# Patient Record
Sex: Female | Born: 1955
Health system: Southern US, Community
[De-identification: ages and names within clinical notes are randomized; demographics above are authoritative.]

## PROBLEM LIST (undated history)

## (undated) DIAGNOSIS — I10 Essential (primary) hypertension: Secondary | ICD-10-CM

## (undated) DIAGNOSIS — S82899A Other fracture of unspecified lower leg, initial encounter for closed fracture: Secondary | ICD-10-CM

## (undated) DIAGNOSIS — F4321 Adjustment disorder with depressed mood: Secondary | ICD-10-CM

## (undated) DIAGNOSIS — R112 Nausea with vomiting, unspecified: Secondary | ICD-10-CM

## (undated) DIAGNOSIS — M1990 Unspecified osteoarthritis, unspecified site: Secondary | ICD-10-CM

## (undated) DIAGNOSIS — I8390 Asymptomatic varicose veins of unspecified lower extremity: Secondary | ICD-10-CM

## (undated) DIAGNOSIS — B009 Herpesviral infection, unspecified: Secondary | ICD-10-CM

## (undated) DIAGNOSIS — G43909 Migraine, unspecified, not intractable, without status migrainosus: Secondary | ICD-10-CM

## (undated) DIAGNOSIS — R7309 Other abnormal glucose: Secondary | ICD-10-CM

## (undated) DIAGNOSIS — N809 Endometriosis, unspecified: Secondary | ICD-10-CM

## (undated) DIAGNOSIS — Z9889 Other specified postprocedural states: Secondary | ICD-10-CM

## (undated) HISTORY — PX: BREAST BIOPSY: SHX20

## (undated) HISTORY — PX: CYSTECTOMY: SUR359

## (undated) HISTORY — DX: Adjustment disorder with depressed mood: F43.21

## (undated) HISTORY — DX: Other fracture of unspecified lower leg, initial encounter for closed fracture: S82.899A

## (undated) HISTORY — DX: Herpesviral infection, unspecified: B00.9

## (undated) HISTORY — DX: Essential (primary) hypertension: I10

## (undated) HISTORY — DX: Asymptomatic varicose veins of unspecified lower extremity: I83.90

## (undated) HISTORY — DX: Endometriosis, unspecified: N80.9

## (undated) HISTORY — DX: Unspecified osteoarthritis, unspecified site: M19.90

## (undated) HISTORY — DX: Migraine, unspecified, not intractable, without status migrainosus: G43.909

## (undated) HISTORY — PX: CHOLECYSTECTOMY: SHX55

## (undated) HISTORY — DX: Other abnormal glucose: R73.09

---

## 1980-04-27 HISTORY — PX: HERNIA REPAIR: SHX51

## 1992-04-27 HISTORY — PX: LAPAROSCOPIC ASSISTED VAGINAL HYSTERECTOMY: SHX5398

## 1992-04-27 HISTORY — PX: ABDOMINAL HYSTERECTOMY: SHX81

## 1999-09-04 ENCOUNTER — Encounter: Admission: RE | Admit: 1999-09-04 | Discharge: 1999-09-04 | Payer: Self-pay | Admitting: Obstetrics and Gynecology

## 1999-09-04 ENCOUNTER — Encounter: Payer: Self-pay | Admitting: Obstetrics and Gynecology

## 1999-10-13 ENCOUNTER — Ambulatory Visit (HOSPITAL_COMMUNITY): Admission: RE | Admit: 1999-10-13 | Discharge: 1999-10-13 | Payer: Self-pay

## 2000-06-25 ENCOUNTER — Emergency Department (HOSPITAL_COMMUNITY): Admission: EM | Admit: 2000-06-25 | Discharge: 2000-06-25 | Payer: Self-pay | Admitting: Emergency Medicine

## 2000-07-20 ENCOUNTER — Encounter: Payer: Self-pay | Admitting: Surgery

## 2000-07-20 ENCOUNTER — Ambulatory Visit (HOSPITAL_COMMUNITY): Admission: RE | Admit: 2000-07-20 | Discharge: 2000-07-20 | Payer: Self-pay | Admitting: Surgery

## 2000-08-10 ENCOUNTER — Encounter: Payer: Self-pay | Admitting: Surgery

## 2000-08-10 ENCOUNTER — Ambulatory Visit (HOSPITAL_COMMUNITY): Admission: RE | Admit: 2000-08-10 | Discharge: 2000-08-10 | Payer: Self-pay | Admitting: Surgery

## 2000-09-27 ENCOUNTER — Encounter: Payer: Self-pay | Admitting: Surgery

## 2000-09-27 ENCOUNTER — Encounter (INDEPENDENT_AMBULATORY_CARE_PROVIDER_SITE_OTHER): Payer: Self-pay | Admitting: Specialist

## 2000-09-27 ENCOUNTER — Observation Stay: Admission: RE | Admit: 2000-09-27 | Discharge: 2000-09-28 | Payer: Self-pay | Admitting: Surgery

## 2001-04-27 HISTORY — PX: HERNIA REPAIR: SHX51

## 2001-06-23 ENCOUNTER — Encounter: Admission: RE | Admit: 2001-06-23 | Discharge: 2001-06-23 | Payer: Self-pay | Admitting: Obstetrics and Gynecology

## 2001-06-23 ENCOUNTER — Encounter: Payer: Self-pay | Admitting: Obstetrics and Gynecology

## 2001-08-23 ENCOUNTER — Inpatient Hospital Stay (HOSPITAL_COMMUNITY): Admission: RE | Admit: 2001-08-23 | Discharge: 2001-08-24 | Payer: Self-pay | Admitting: Obstetrics and Gynecology

## 2002-08-31 ENCOUNTER — Encounter: Admission: RE | Admit: 2002-08-31 | Discharge: 2002-08-31 | Payer: Self-pay | Admitting: Surgery

## 2002-08-31 ENCOUNTER — Encounter: Payer: Self-pay | Admitting: Surgery

## 2002-09-12 ENCOUNTER — Encounter: Payer: Self-pay | Admitting: Obstetrics and Gynecology

## 2002-09-12 ENCOUNTER — Encounter: Admission: RE | Admit: 2002-09-12 | Discharge: 2002-09-12 | Payer: Self-pay | Admitting: Obstetrics and Gynecology

## 2003-01-02 ENCOUNTER — Encounter: Admission: RE | Admit: 2003-01-02 | Discharge: 2003-01-02 | Payer: Self-pay | Admitting: Surgery

## 2003-01-02 ENCOUNTER — Encounter: Payer: Self-pay | Admitting: Surgery

## 2003-07-24 ENCOUNTER — Encounter: Admission: RE | Admit: 2003-07-24 | Discharge: 2003-07-24 | Payer: Self-pay | Admitting: Surgery

## 2003-12-24 ENCOUNTER — Encounter: Admission: RE | Admit: 2003-12-24 | Discharge: 2003-12-24 | Payer: Self-pay | Admitting: Obstetrics and Gynecology

## 2004-01-10 ENCOUNTER — Encounter: Admission: RE | Admit: 2004-01-10 | Discharge: 2004-01-10 | Payer: Self-pay | Admitting: Obstetrics and Gynecology

## 2004-02-27 ENCOUNTER — Inpatient Hospital Stay (HOSPITAL_COMMUNITY): Admission: RE | Admit: 2004-02-27 | Discharge: 2004-03-03 | Payer: Self-pay | Admitting: Obstetrics and Gynecology

## 2004-02-27 ENCOUNTER — Encounter (INDEPENDENT_AMBULATORY_CARE_PROVIDER_SITE_OTHER): Payer: Self-pay | Admitting: *Deleted

## 2005-02-04 ENCOUNTER — Encounter: Admission: RE | Admit: 2005-02-04 | Discharge: 2005-02-04 | Payer: Self-pay | Admitting: Obstetrics and Gynecology

## 2005-02-25 HISTORY — PX: COLON RESECTION: SHX5231

## 2005-03-02 ENCOUNTER — Emergency Department (HOSPITAL_COMMUNITY): Admission: EM | Admit: 2005-03-02 | Discharge: 2005-03-02 | Payer: Self-pay | Admitting: *Deleted

## 2005-11-06 IMAGING — CT CT PELVIS W/ CM
1 series · 15 of 32 positions shown, 19 images · IV contrast (GASTROGRAFIN & [ID] OMNI 300)
Comparison: none

CLINICAL DATA: Follow up right adrenal and left adnexal masses.
 CT ABDOMEN AND PELVIS WITH CONTRAST
TECHNIQUE: Multidetector helical imaging carried out through the abdomen and pelvis utilizing oral and IV contrast (100 cc Omnipaque 300 ? contrast code:   XV3.W).
 Comparison to prior studies [REDACTED] and December 2002.
 CT ABDOMEN WITH CONTRAST
 Lung bases clear.  Liver, spleen, and pancreas normal.  Prior cholecystectomy. 
 Left adrenal normal.  Stable nodule emanating off the medial limb of the right adrenal.  No evidence that this has changed in size and contour.  This is likely to be a benign lesion such as an adenoma.  Kidneys normal.  No adenopathy or ascites.
 IMPRESSION
 1.  Small right adrenal nodule ? no change since August 2002.  Probable benign adenoma.
 2.  Status post cholecystectomy.
 3.  No other findings of significance.
 CT PELVIS WITH CONTRAST
 There is a 3.7 x 3.1 cm left adnexal cyst.  It has Hounsfield units at or near water.  It looks almost identical to its appearance in August 2002.  The CT in December 2002, did not include the pelvis.  Since it has not grown and has Hounsfield units at or near water, it is almost surely a benign cyst.  It is possible that it resolved and recurred since the prior study.  If this lesion is to be followed, I would recommend ultrasound, which is not only more economical, but avoids ionizing radiation. 
 No other masses or fluid collections.  No adenopathy.
 3.8 x 3.1 cm simple cyst in the left adnexal region ? essentially unchanged since August 2002.  See above discussion.

[Series 2: — · axial · 0.70mm/px · z∈[-419,-29]mm · 15 of 120 slices shown, 19 images]
[im 8/120  soft-tissue]
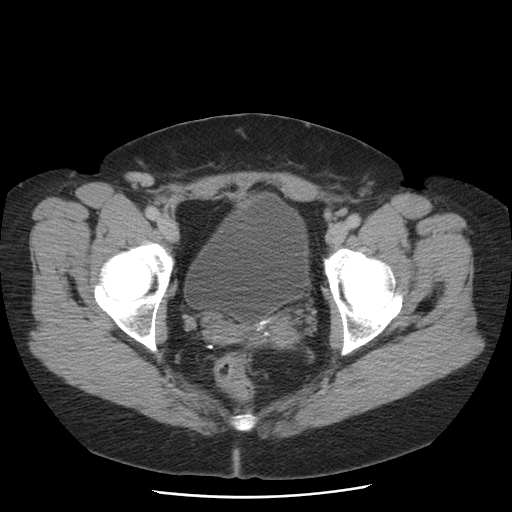
[im 8/120  bone]
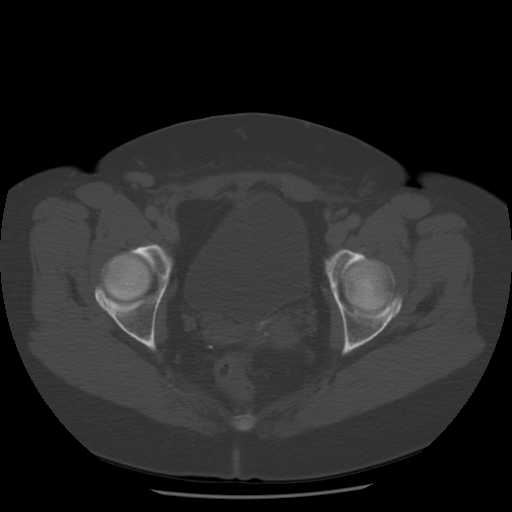
[im 16/120  soft-tissue]
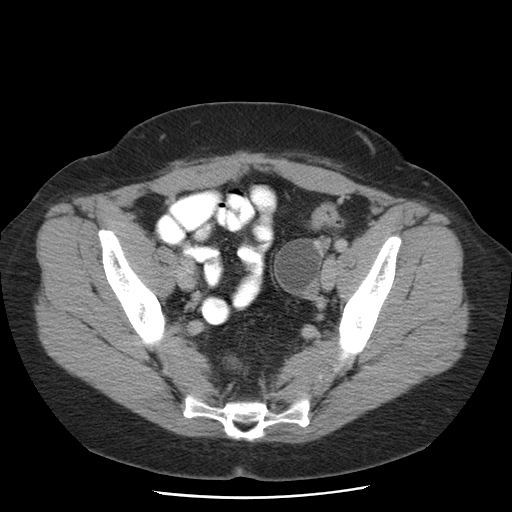
[im 24/120  soft-tissue]
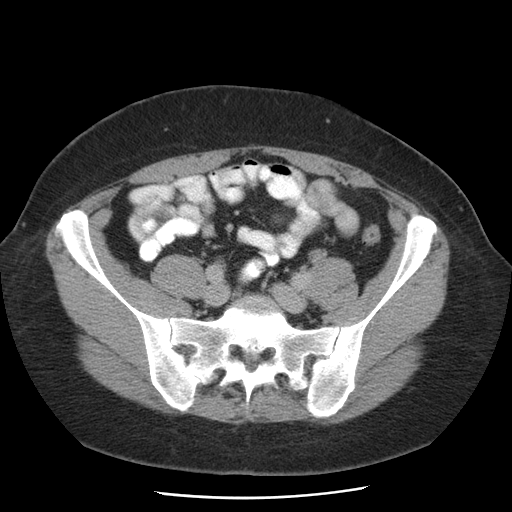
[im 35/120  soft-tissue]
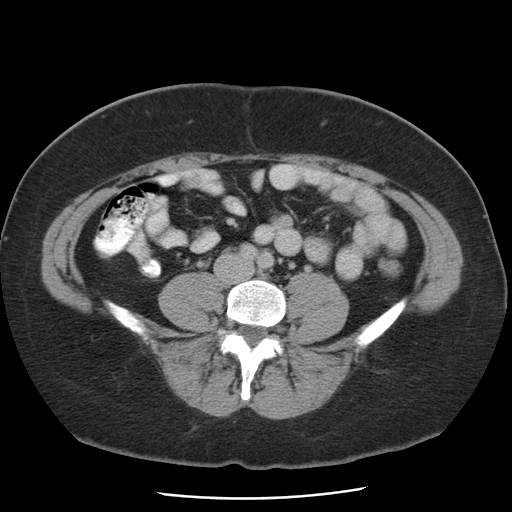
[im 43/120  soft-tissue]
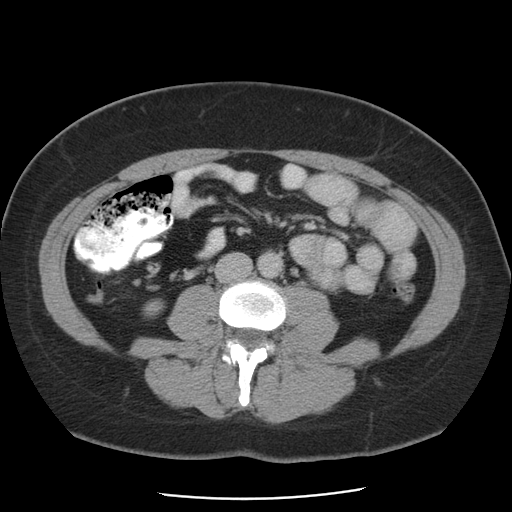
[im 50/120  soft-tissue]
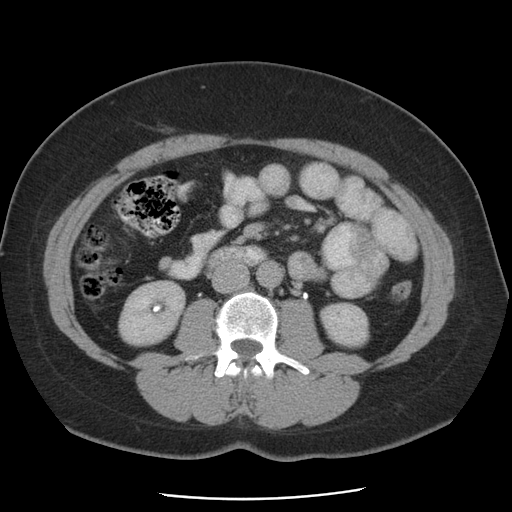
[im 62/120  soft-tissue]
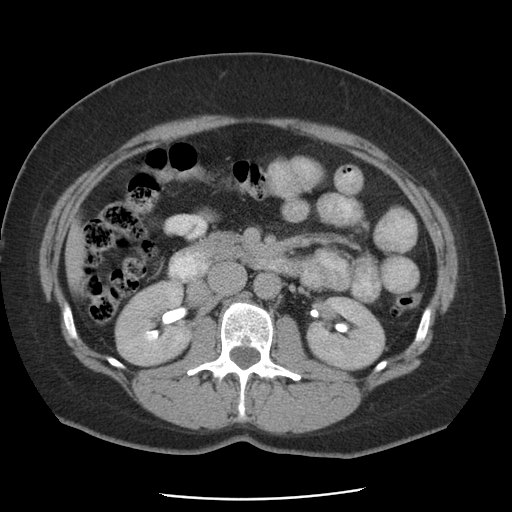
[im 70/120  soft-tissue]
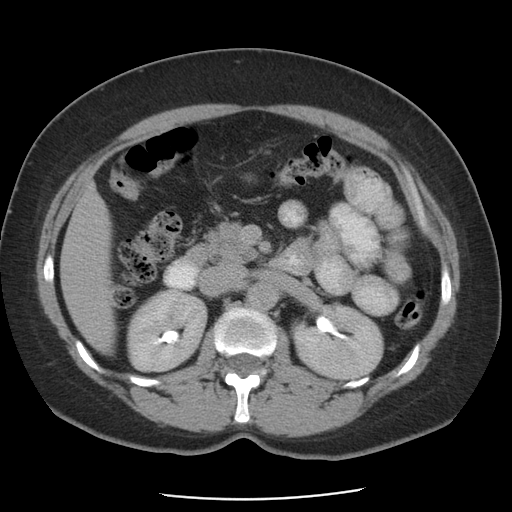
[im 77/120  soft-tissue]
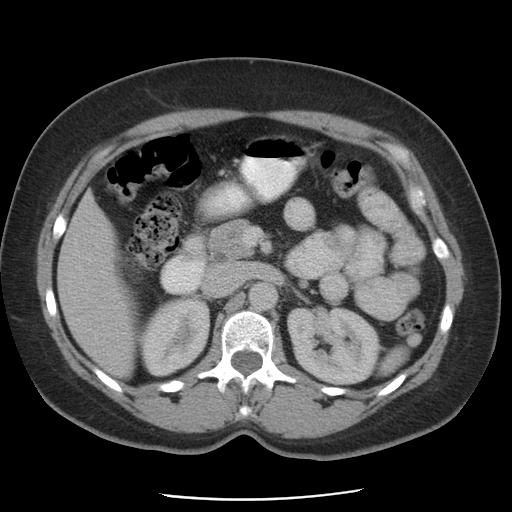
[im 77/120  bone]
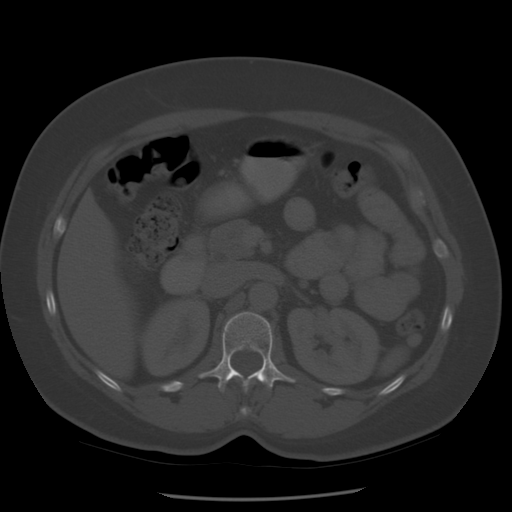
[im 85/120  soft-tissue]
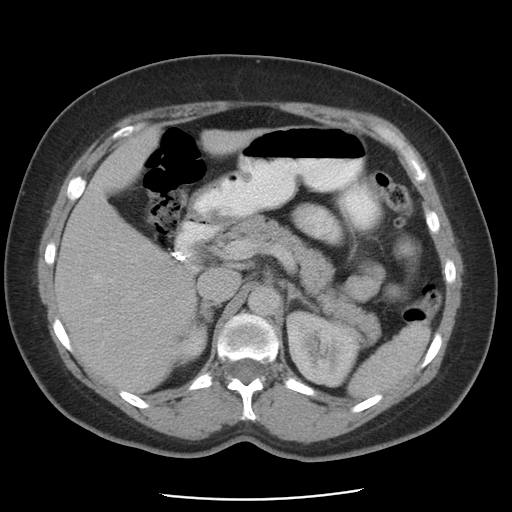
[im 96/120  soft-tissue]
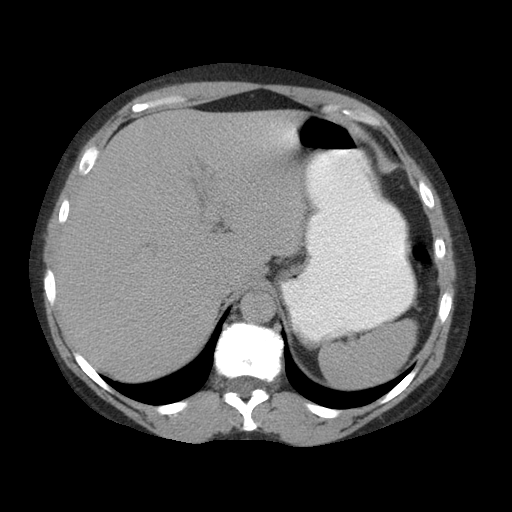
[im 104/120  soft-tissue]
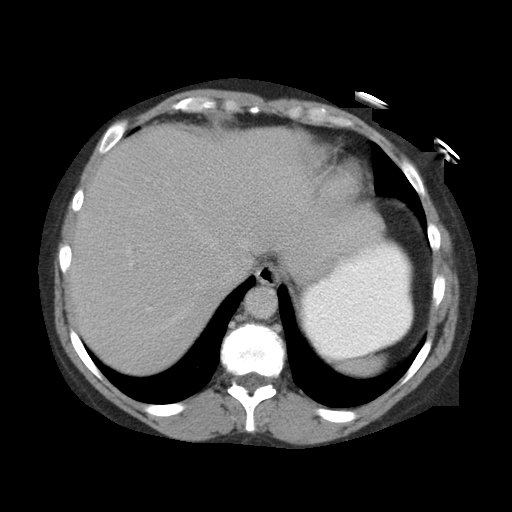
[im 104/120  lung]
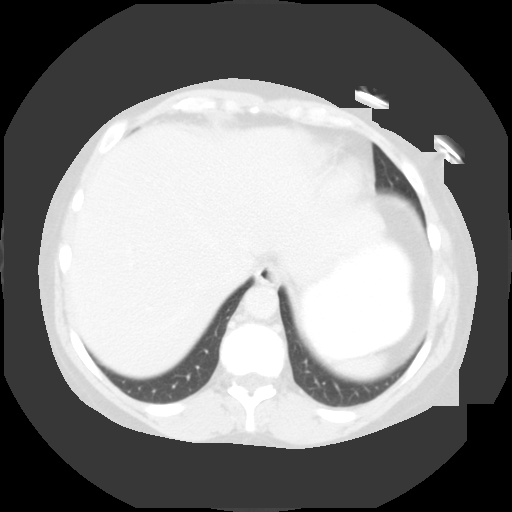
[im 108/120  lung]
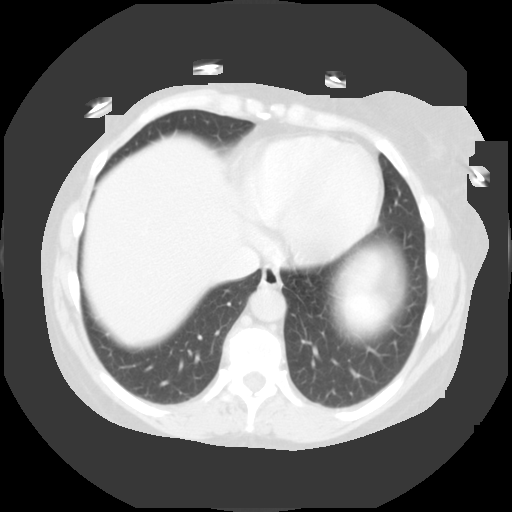
[im 112/120  soft-tissue]
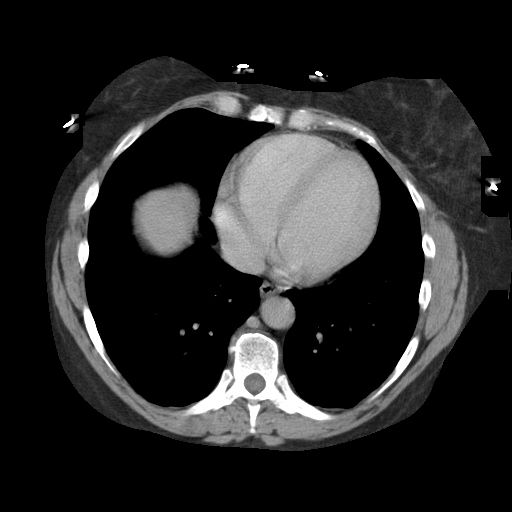
[im 112/120  lung]
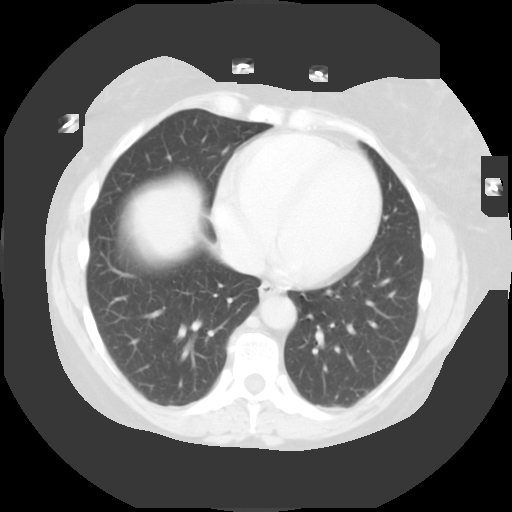
[im 116/120  lung]
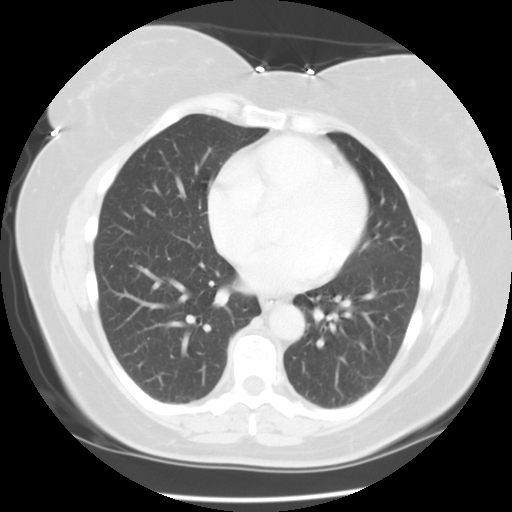

[15 of 32 positions shown; findings below may reference images not displayed]

## 2005-12-18 ENCOUNTER — Encounter: Admission: RE | Admit: 2005-12-18 | Discharge: 2005-12-18 | Payer: Self-pay | Admitting: Surgery

## 2006-02-05 ENCOUNTER — Encounter: Admission: RE | Admit: 2006-02-05 | Discharge: 2006-02-05 | Payer: Self-pay | Admitting: Obstetrics and Gynecology

## 2006-12-22 ENCOUNTER — Emergency Department (HOSPITAL_COMMUNITY): Admission: EM | Admit: 2006-12-22 | Discharge: 2006-12-22 | Payer: Self-pay | Admitting: Emergency Medicine

## 2007-02-22 ENCOUNTER — Encounter: Admission: RE | Admit: 2007-02-22 | Discharge: 2007-02-22 | Payer: Self-pay | Admitting: Obstetrics and Gynecology

## 2008-03-05 ENCOUNTER — Encounter: Admission: RE | Admit: 2008-03-05 | Discharge: 2008-03-05 | Payer: Self-pay | Admitting: Obstetrics and Gynecology

## 2008-08-01 ENCOUNTER — Encounter: Admission: RE | Admit: 2008-08-01 | Discharge: 2008-08-01 | Payer: Self-pay | Admitting: Surgery

## 2009-03-28 ENCOUNTER — Encounter: Admission: RE | Admit: 2009-03-28 | Discharge: 2009-03-28 | Payer: Self-pay | Admitting: Obstetrics and Gynecology

## 2010-04-08 ENCOUNTER — Encounter
Admission: RE | Admit: 2010-04-08 | Discharge: 2010-04-08 | Payer: Self-pay | Source: Home / Self Care | Attending: Obstetrics and Gynecology | Admitting: Obstetrics and Gynecology

## 2010-06-17 ENCOUNTER — Other Ambulatory Visit: Payer: Self-pay | Admitting: Obstetrics and Gynecology

## 2010-06-17 ENCOUNTER — Ambulatory Visit
Admission: RE | Admit: 2010-06-17 | Discharge: 2010-06-17 | Disposition: A | Discharge status: Home / Self Care | Payer: 59 | Source: Ambulatory Visit | Attending: Obstetrics and Gynecology | Admitting: Obstetrics and Gynecology

## 2010-06-17 MED ORDER — IOHEXOL 300 MG/ML  SOLN
125.0000 mL | Freq: Once | INTRAMUSCULAR | Status: AC | PRN
  Administered 2010-06-17: 125 mL via INTRAVENOUS

## 2010-09-12 NOTE — Op Note (Signed)
[facility]  Patient:    [name], [name] Visit Number: [id number] MRN: [id number]          Service Type: GYN Location: 1S X007 01 Attending Physician:  Lendon Colonel Dictated by:   Kathie Rhodes. Kyra Manges, M.D. Proc. Date: 08/23/01 Admit Date:  08/23/2001                             Operative Report  PREOPERATIVE DIAGNOSES:  Recurrent right inguinal hernia and stress incontinence.  POSTOPERATIVE DIAGNOSES:  Recurrent right inguinal hernia and stress incontinence.  OPERATION PERFORMED:  Examination under anesthesia, Burch procedure and repair of direct inguinal hernia by Dr. Daphine Deutscher.  DESCRIPTION OF PROCEDURE:  The patient was placed in lithotomy position in the frog leg position, prepped and draped in the usual fashion. Exam under anesthesia was accomplished prior to prep. No pelvic masses were noted. The old cesarean section scar was entered and the retropubic space was entered with sharp dissection. Careful dissection was done in the retropubic space to identify the vaginal wall. A Foley catheter was identified. Two sutures were placed using #0 Ethibond on each side of the vaginal wall to the inguinal ligament. These were tied down. Hemostasis was secure. Dr. Daphine Deutscher then inserted a medium size inguinal mesh in the retroperitoneal space to repair the inguinal defect. This was intact posteriorly as well as sutured anteriorly with #0 Ethibond. Irrigation was performed and copious amounts of saline was used to irrigate the retropubic space and then the fascia was closed with a running suture of 2-0 PDS. The midline fascial incision was closed with one interrupted suture of #0 Ethibond. The skin was then closed with 4-0 PDS and infiltrated with 0.5% Marcaine with epinephrine. [name] tolerated the procedure well and was sent to the recovery room in good condition. Dictated by:   S. Kyra Manges, M.D. Attending Physician:  Lendon Colonel DD:   08/23/01 TD:  08/23/01 Job: [id number]

## 2010-09-12 NOTE — Op Note (Signed)
NAMEZOLA, RUNION                 ACCOUNT NO.:  [id number]   MEDICAL RECORD NO.:  [id number]          PATIENT TYPE:  INP   LOCATION:  0008                         FACILITY:  [facility]   PHYSICIAN:  Anselm Pancoast. Weatherly, M.D.DATE OF BIRTH:  07-17-1955   DATE OF PROCEDURE:  02/27/2004  DATE OF DISCHARGE:                                 OPERATIVE REPORT   PREOPERATIVE DIAGNOSES:  Complex cyst ovary, Dr. Kyra Manges operating, and  intraoperative found a firm lesion in the sigmoid colon.  Intraoperative  consultation requested.   ASSISTANT:  Katherine Roan, M.D.   HISTORY:  [name] is a 55 year old patient of Dr. Lavon Paganini whose had a  complex cyst found on CT and ultrasound examination and was scheduled for  resection. The patient's lesion had been excised and sent for pathology with  the findings all benign when a lesion in the sigmoid colon was noted and  this was not completely obstructing the colon but was definitely partially  occluding it very firm and then there was a little second area about 3 cm  more proximal that also looked like a similar but earlier lesion. I was  asked to scrub in and did.  Dr. Phyllis Ginger had assisted Dr. Elana Alm and he  dropped out.  The patient had a midline incision and we switched to our  [place] so I could evaluate the more proximal colon.  I did not see any  evidence of any obvious diverticulosis or diverticulitis and this area which  the lesion was probably about 15 cm or maybe 16-17 cm from the anal verge  was the area of questions.  On examination and feeling, it certainly felt  that this could certainly be a malignancy. There was not anything obviously  on the peritoneal surface but the peritoneal surface was kind of tucked in  and the proximal colon was not dilated as if there was no obstruction. I  recommended that at best just go ahead and resect this. She had had a good  mechanical prep in preparation for the GYN procedure.  I scrubbed  in, Dr.  Elana Alm assisted and then we packed off the upper abdomen with the Balfour  extender, kind of mobilized the more proximal sigmoid flexure so that it was  floppy and then divided the colon so the mid sigmoid mesentery was divided  between [facility] and then switched in a right angle so that we always kind of  stayed midline, did not actually open up the retroperitoneum to expose  either the right or left ureter but you could see the ureters through the  peritoneum.  Dr. Elana Alm had identified them previously.  Then after the  distal portion had been freed up, we went back to the bowel wall dividing  the distal pedicles with right angles. I then took a Satinsky and crossed  the basically upper rectum with this and then using a scalpel and scissors  removed the specimen from the field.  This was sent over and Dr. Laureen Ochs  examined it and we had kind of looked into the mucosa and  there was not an  actual mucosa lesion and probably this is going to turn out to be  ____________ endometriosis. The two ends of the bowel would come  together  very easily and a single layer of silk anastomosis knots inverted was  performed with full thickness stitches in interrupted fashion.  The anterior  portion was necessary to split the antimesenteric surface about an inch on  the bowel to kind of enlarge the proximal section because the rectum was  much wider than the distal sigmoid colon. Anastomosis will admit 2-3 fingers  easily at completion. It is lying without tension and it appears to be water  tight.  The patient had a spring clamp on the proximal bowel previously  proximally and there was very little spillage and we irrigated thoroughly.  The little mesenteric defect was then closed with interrupted sutures of 3-0  silk and then I assisted Dr. Elana Alm who did his usual closure with 2-0 PDS  and then #0 Novofil in the fascia.  The patient will be transferred to my  service and will keep her n.p.o.  with an NG tube for a couple of days and  probably keep the Foley for 2-3 days and then progress her as her bowel  function returns. I am going to keep her on antibiotics for at least 24  hours. She was on Mefoxin and we will continue that for at least 24-48 hours  postop and see how she does.  No drains were  placed. The small bowel is in anatomic position with the omentum brought  down over the small bowel prior to closing the abdominal wound.  Sponge and  needle counts were correct.  For this portion of the surgery, probably 200  mL of blood loss if that much.      WJW/MEDQ  D:  02/27/2004  T:  02/27/2004  Job:  [id number]   cc:   S. Kyra Manges, M.D.  [phone] [address]  [place]  [place] [address]  Fax: [phone]

## 2010-09-12 NOTE — Discharge Summary (Signed)
NAMEJAYLEEN, [name]                 ACCOUNT NO.:  [id number]   MEDICAL RECORD NO.:  [id number]          PATIENT TYPE:  INP   LOCATION:  0481                         FACILITY:  [facility]   PHYSICIAN:  Anselm Pancoast. Weatherly, M.D.DATE OF BIRTH:  1955/05/22   DATE OF ADMISSION:  02/27/2004  DATE OF DISCHARGE:  03/03/2004                                 DISCHARGE SUMMARY   DISCHARGE DIAGNOSES:  1.  Endometriosis, full-thickness, sigmoid colon, two areas.  2.  Complex cyst, left ovary.  3.  Benign serous hemorrhagic corpus luteum cyst.   OPERATION/PROCEDURE:  1.  Exploratory laparotomy.  2.  Left salpingo-oophorectomy.  3.  Sigmoid colectomy with low anastomosis.   HISTORY:  [name] is a 55 year old para 2, female status post  hysterectomy and removal of the right ovary, history of intermittent left  lower abdominal pain and preoperatively was noted to  have a complex cyst in  the left ovary, a mildly elevated CA125 of 38 and was scheduled for a  laparotomy of the left tube and ovary.  Dr. Elana Alm was performing this  surgery.  Dr. Phyllis Ginger was his assistant and he found a definite thickened  lesion in the sigmoid colon and close to the sigmoid cyst that was  definitely abnormal and he asked that I scrub in.  On palpation of the area,  it was definitely, thick, indurated, tucked in, looked grossly more like a  colon cancer and the patient had had a mechanical bowel prep in preparation  for surgery and Dr. Elana Alm went ahead and talked to the patient's husband  and I recommended that if they were in agreement, that we could proceed on  with sigmoid colectomy.  I did not see anything that looked like  diverticulitis and the patient has never had a previous evaluation.  She  has, however, had bloody bowel movements sort of associated with her  menstrual period prior to her hysterectomy and, of course, has had these  intermittent episodes and cramping lower abdominal pain.  The patient's  husband desired that we proceed on.  Dr. Rosalia Hammers dropped out and Dr. Elana Alm  assisted as I did a sigmoid colectomy and a hand-sewn, low anastomosis.   Postoperatively Dr. Elana Alm transferred her to my service and she did  satisfactory.  She had an NG tube for approximately three days.  The NG tube  was removed.  A low-grade temperature was controlled with coughing, etc.  We  kept her on antibiotics for approximately two days.  She started pacing  flatus on the fourth postoperative day and NG tube was removed.  She was  started on a liquid diet which I advanced on up to full liquids November 7.  Her hematocrit was 41 and white count was 8200.  Pathology report showed  this was endometriosis, full-thickness, through the actual bowel wall of the  sigmoid colon.  She was released with her stitches and staples in place and  will follow up in the office in approximately three to four days.   Advance her diet to a regular diet over the next 48  hours and take Tylenol  for pain.  Her incision appeared to be healing nicely and she understands  not to be doing any heavy lifting or strenuous activity for approximately  three to four weeks following surgery.      WJW/MEDQ  D:  03/13/2004  T:  03/13/2004  Job:  [id number]   cc:   S. Kyra Manges, M.D.  [phone] [address]  [place]  [place] [address]  Fax: [phone]

## 2010-09-12 NOTE — Op Note (Signed)
NAMESAMARY, SHATZ                 ACCOUNT NO.:  [id number]   MEDICAL RECORD NO.:  [id number]          PATIENT TYPE:  INP   LOCATION:  0008                         FACILITY:  [facility]   PHYSICIAN:  Katherine Roan, M.D.  DATE OF BIRTH:  [birth year]   DATE OF PROCEDURE:  DATE OF DISCHARGE:                                 OPERATIVE REPORT   PREOPERATIVE DIAGNOSIS:  Pelvic mass, slightly elevated CA-125.   POSTOPERATIVE DIAGNOSIS:  Benign cyst of left ovary, sigmoid thickening  suggestive of endometriosis or diverticulosis.   DESCRIPTION OF OPERATION:  The patient was placed in lithotomy position  after general anesthesia instituted.  I was unable to feel the pelvic mass  that was reported on ultrasound.  We prepped and draped her, and a midline  incision was made in the abdomen, extended in layers to the peritoneal  cavity which was entered vertically.  Exploration of the upper abdomen  revealed a smooth liver, and both kidneys were normal, and the gallbladder  was without stones.  No paraaortic adenopathy was noted.  Pelvic washings  were obtained.  The left ovary was mobile, had surface endometriosis on it.  The left ovary was removed using sharp dissection to skeletonize the  infundibulopelvic and cauterize and suture the round ligament.  The ovary  was sent for frozen section.  On inspection of the pelvis, there was a  thickened area on the sigmoid colon I thought suggestive of endometriosis,  but it was quite hard, and we requested a surgical consult.  Dr. Consuello Bossier was kind enough to come in and evaluate her and recommended a  distal sigmoid colectomy, which he performed and will dictate later.  I then  closed the abdomen.  The peritoneum was closed with 2-0 PDS.  The fascia was  closed with figure-of-eight 0 Novofil.  The subcutaneum was closed with 3-0  Vicryl, and the skin was closed with 3-0 nylon and skin clips.  Ms. Kocak  tolerated  the procedure well.  I would guess  the blood loss about 300 cc.  She was  sent to the recovery room in good condition.  It should be noted that upon  finding the thickening of the colon I broke scrub and went out and discussed  a colectomy with the family, and they agreed with our decision.      SDM/MEDQ  D:  02/27/2004  T:  02/27/2004  Job:  [id number]   cc:   Raynald Kemp, M.D.  [address]  [place]  [place] [address]  Fax: [phone]   Anselm Pancoast. Zachery Dakins, M.D.  [address], [address]  [place]  [place] [address]  Fax: [phone]

## 2010-09-12 NOTE — Op Note (Signed)
[facility]  Patient:    [name], [name]                        MRN: [id number] Proc. Date: 09/27/00 Adm. Date:  [id number] Attending:  Katha Cabal                           Operative Report  PREOPERATIVE DIAGNOSIS:  Biliary dyskinesia.  POSTOPERATIVE DIAGNOSIS:  Biliary dyskinesia.  OPERATION PERFORMED:  Laparoscopic cholecystectomy with intraoperative cholangiogram.  SURGEON:  Thornton Park. Daphine Deutscher, M.D.  ANESTHESIA:  DESCRIPTION OF PROCEDURE:  The patient was taken to operating room #1 on June 3 and given general anesthesia.  The abdomen was prepped with Betadine and draped sterilely.  I entered the abdomen through a small transverse incision which was in an old scar from previous laparoscopy.  After entering, I found a small umbilical hernia from her previous laparoscopic procedure.  I repaired that at the end of the case using an Endo close suturing device and 0 Vicryl.  I entered the abdomen, insufflated, surveyed the abdomen and found no surface abnormalities.  The gallbladder looked somewhat sickly and flaccid although it did not appear to be acutely inflamed.  It was grasped, elevated and Calots triangle dissected free.  I put a clip up on the gallbladder and incised it.  I got some kind of dark bile coming back.  An intraoperative cholangiogram was performed which showed a long cystic duct going anteriorly and farther down on the common duct with prompt flow into the duodenum and with retrograde filling of the liver.  The cystic duct was triple clipped, divided.  The cystic artery was triple clipped and divided.  The gallbladder was removed from the gallbladder bed using hook cautery.  It was not entered and was detached from the gallbladder bed and brought out through the umbilicus.  I went back in and irrigated and cauterized but there was really no bleeding or bile leaks noted.   The umbilical port as mentioned before  was fixed under direct vision with the Endoclose.  The abdomen was deflated and all incisions were injected with Marcaine and the skin was closed with 4-0 Vicryl, benzoin and Steri-Strips.  The patient seemed to tolerate the procedure well and was taken to the PACU in satisfactory condition. DD:  09/27/00 TD:  09/27/00 Job: [id number]

## 2010-09-12 NOTE — Discharge Summary (Signed)
[facility]  Patient:    [name], [name] Visit Number: [id number] MRN: [id number]          Service Type: GYN Location: 4W 0462 01 Attending Physician:  Lendon Colonel Dictated by:   Kathie Rhodes. Kyra Manges, M.D. Admit Date:  08/23/2001 Discharge Date: 08/24/2001   CC:         [place]. Daphine Deutscher, M.D.   Discharge Summary  ADMISSION DIAGNOSES: 1. Stress urinary incontinence. 2. Right inguinal hernia.  DISCHARGE DIAGNOSES: 1. Stress urinary incontinence. 2. Right inguinal hernia.  OPERATION PERFORMED: Burch procedure and repair of right inguinal hernia.  BRIEF HISTORY:  The patient is a 55 year old female with right lower quadrant pain, recurrent right inguinal hernia, and stress incontinence.  She was admitted for surgery.  She was status post LAVA and had done well from that.  LABORATORY DATA:  Hemoglobin 13, hematocrit 39.  HOSPITAL COURSE:  The patient was admitted to the hospital and underwent uneventful Burch procedure and repair of right inguinal hernia.  Her postop course was uncomplicated.  She was discharged the following day to home and office care.  DISPOSITION:  She is asked to return to the office in two weeks.  She is to call for fever, bleeding, or any other difficulty. Dictated by:   S. Kyra Manges, M.D. Attending Physician:  Lendon Colonel DD:  09/06/01 TD:  09/07/01 Job: [id number]

## 2010-09-12 NOTE — H&P (Signed)
NAME:  [name], [name]                 ACCOUNT NO.:  [id number]   MEDICAL RECORD NO.:  [id number]          PATIENT TYPE:  INP   LOCATION:  NA                           FACILITY:  [facility]   PHYSICIAN:  Katherine Roan, M.D.  DATE OF BIRTH:  1956/02/24   DATE OF ADMISSION:  DATE OF DISCHARGE:                                HISTORY & PHYSICAL   CHIEF COMPLAINT:  Intermittent left lower quadrant pain with ovarian cyst on  the left and elevated CA-125.   This is a 55 year old para 2 female status post hysterectomy and removal of  right ovary with history of incontinence surgery consisting of a Burch and  right inguinal hernia repair with intermittent left lower quadrant pain with  a documented complex cyst in her left ovary.  A CA-125 was elevated at 38,  and she is admitted for laparotomy and removal of left tube and ovary.  She  has a history of two C-sections and gallbladder surgery, tubal ligation,  right inguinal hernia and Burch procedure.   She is allergic to CODEINE.   She takes Tranxene 7.5 mg p.r.n.   REVIEW OF SYSTEMS:  HEENT:  She has no headaches or dizziness.  No decrease  in visual acuity.  HEART:  No history of hypertension or rheumatic fever.  No chest pain.  No shortness of breath.  LUNGS:  No chronic cough.  No  asthma.  GU:  She denies stress incontinence.  She has a little urge.  GI:  No bowel habit change.  No melena.  MUSCLE, BONES, AND JOINTS:  No fractures  or arthritis.   FAMILY HISTORY:  Her mother is diabetic.  Her father died of cancer of the  lungs at age 54.  She has four sisters are in good health.  She has a  paternal grandfather who had cancer of the lung as well.   PHYSICAL EXAMINATION:  VITAL SIGNS:  Weight 212.  Blood pressure 150/98.  GENERAL:  A well-developed, well-nourished female who appears to be her  stated age of 55.  HEENT:  Examination of the ears, nose, and throat is unremarkable.  Oropharynx is not injected.  NECK:  Supple.  Carotid  pulses are equal and without bruits.  BREASTS:  No masses or tenderness.  LUNGS:  Clear to P&A.  HEART:  Normal sinus rhythm.  No murmurs, heaves, thrills, rubs, or gallops.  ABDOMEN:  Soft and flat.  Liver, spleen, and kidneys are not palpated.  Bowel sounds are normal, and no bruits are heard.  PELVIC:  Tenderness in the left adnexa.  I am unable to feel any masses.  EXTREMITIES:  Good range of motion with equal pulses and reflexes.   Ultrasound confirmed the 6.5 cm complex cystic mass.   IMPRESSION:  Pelvic mass with associated pelvic pain and slightly elevated  CA-125.   PLAN:  Laparotomy with Dr. Stanford Breed on standby.  We have discussed the  risks of the procedure, including infection, hemorrhage, damage to bladder  and bowel.  She has accepted this risk and is prepared to proceed.  SDM/MEDQ  D:  02/25/2004  T:  02/25/2004  Job:  [id number]

## 2010-09-12 NOTE — H&P (Signed)
[facility]  Patient:    [name], NEBERGALL Visit Number: [id number] MRN: [id number]          Service Type: GYN Location: 1S X007 01 Attending Physician:  Lendon Colonel Dictated by:   Kathie Rhodes. Kyra Manges, M.D. Admit Date:  08/23/2001                           History and Physical  CHIEF COMPLAINT:  Stress incontinence.  HISTORY OF PRESENT ILLNESS:  The patient is a 55 year old gravida 2, para 2, who is status post C-section, status post LAVH, status post right inguinal hernia repair, and gallbladder surgery with umbilical hernia, who presents with stress incontinence.  She does have some urge, but mostly stress with coughing and sneezing, and desires this to be corrected.  While having this surgery, she is also going to have a right inguinal hernia fixed by Dr. Luretha Murphy since this is recurrent and causing her some pain.  MEDICATIONS:  She currently takes Tranxene p.r.n.  REVIEW OF SYSTEMS:  HEENT:  No headaches or decrease in vision or auditory acuity.  No dizziness.  HEART:  No history of rheumatic fever.  No heart murmur or chest pain.  No hypertension.  LUNGS:  No chronic cough.  No asthma. GU:  She has stress incontinence with coughing, sneezing, and lifting.  She denies nocturia and has minimal urge.  GI:  No bowel habit change.  No weight loss or gain.  MUSCLES, BONES, AND JOINTS:  No fractures or arthritis.  SOCIAL HISTORY:  She drinks alcohol socially.  Does not smoke.  FAMILY HISTORY:  Her mother is 70 and is diabetic.  Father died of cancer of the lung.  Maternal grandfather also had cancer of the lung.  Her mother is diabetic.  She has four sisters.  PHYSICAL EXAMINATION:  GENERAL:  Reveals a well-developed and nourished female who appears to be her stated age.  VITAL SIGNS:  Blood pressure 150/80, pulse 80, respirations 16.  HEENT:  Unremarkable.  The oropharynx is not injected.  NECK:  Supple.  Carotid pulses are equal  without bruits.  Thyroid is not enlarged.  BREASTS:  No masses or tenderness.  LUNGS:  Clear to P&A.  HEART:  Normal sinus rhythm and no murmurs.  ABDOMEN:  Soft and flat.  Liver, spleen, and kidneys are not palpated.  There is a low transverse incision and umbilical incisions which are well-healed, and there is also a right inguinal hernia incision.  This is quite tender.  PELVIC:  Examination reveals a hypermobile urethra.  No pelvic masses are noted.  There is no significant posterior relaxation.  Hemoccult is negative.  EXTREMITIES:  Show a good range of motion and equal pulses and reflexes.  IMPRESSION:  Stress incontinence.  PLAN:  Burch procedure discussed.  Failure rate and risks discussed with the patient including urge incontinence.  She will also have right inguinal hernia repair at the time of surgery. Dictated by:   S. Kyra Manges, M.D. Attending Physician:  Lendon Colonel DD:  08/22/01 TD:  08/23/01 Job: [id number]

## 2010-09-12 NOTE — Op Note (Signed)
[facility]  Patient:    [name], [name] Visit Number: [id number] MRN: [id number]          Service Type: GYN Location: 1S X007 01 Attending Physician:  Lendon Colonel Dictated by:   Thorn[place] Park Daphine Deutscher, M.D. Proc. Date: 08/23/01 Admit Date:  08/23/2001   CC:         Katherine Roan, M.D.  Al Decant. Janey Greaser, M.D.   Operative Report  PREOPERATIVE DIAGNOSIS:  Recurrent right groin pain status post hernia repair in 1982.  POSTOPERATIVE DIAGNOSIS:  Right direct inguinal hernia.  PROCEDURE:  Right inguinal herniorrhaphy using preperitoneal placement of 3D max mesh by Davol.  SURGEON:  Thornton Park. Daphine Deutscher, M.D.  ASSISTANT:  Katherine Roan, M.D.  ANESTHESIA:  General.  DESCRIPTION OF PROCEDURE:  [name] was in room 11 at [facility] on April 29. We had dissected her preperitoneal space for a Burch procedure which had been done. The area was cleaned laterally on the right side. The preperitoneal space was cleared off behind the rectus abdominis muscle down to Coopers ligament. The femoral vein was identified. The hernia was identified and could accept 2-3 fingers and appeared to be direct where it had torn out of the floor. Because of the Burch approach, I elected to use a piece of Davol 3D max mesh right side medium and tacked it medially on the pubis and then down along Coopers ligament where it lay out laterally. This seemed to cover the defect nicely. Anteriorly I sutured and placed the single of the Ethibond that were used in the other repair. This kept the mesh situated and we could then put our fingers in and feel where the hernia defect was and it was fully covered with mesh. The wound was then closed which was a Pfannenstiel approach and the patient seemed to tolerate the procedure well and was taken to the recovery room. She will be kept in the hospital for overnight observation.  FINAL DIAGNOSES:  Right  direct inguinal hernia status post repair with 3D max mesh. Dictated by:   Thornton Park Daphine Deutscher, M.D. Attending Physician:  Lendon Colonel DD:  08/23/01 TD:  08/23/01 Job: [id number]

## 2010-10-22 ENCOUNTER — Other Ambulatory Visit: Payer: Self-pay | Admitting: Obstetrics & Gynecology

## 2010-10-22 DIAGNOSIS — N63 Unspecified lump in unspecified breast: Secondary | ICD-10-CM

## 2010-10-23 ENCOUNTER — Ambulatory Visit
Admission: RE | Admit: 2010-10-23 | Discharge: 2010-10-23 | Disposition: A | Discharge status: Home / Self Care | Payer: 59 | Source: Ambulatory Visit | Attending: Obstetrics & Gynecology | Admitting: Obstetrics & Gynecology

## 2010-10-23 DIAGNOSIS — N63 Unspecified lump in unspecified breast: Secondary | ICD-10-CM

## 2011-02-06 LAB — URINALYSIS, ROUTINE W REFLEX MICROSCOPIC
Bilirubin Urine: NEGATIVE
Glucose, UA: NEGATIVE
Ketones, ur: NEGATIVE
Nitrite: NEGATIVE
Protein, ur: NEGATIVE
Urobilinogen, UA: 0.2
pH: 6.5

## 2011-02-06 LAB — URINE CULTURE: Colony Count: >=100000

## 2011-02-06 LAB — URINE MICROSCOPIC-ADD ON

## 2011-04-23 ENCOUNTER — Other Ambulatory Visit: Payer: Self-pay | Admitting: Obstetrics & Gynecology

## 2011-04-23 DIAGNOSIS — Z1231 Encounter for screening mammogram for malignant neoplasm of breast: Secondary | ICD-10-CM

## 2011-05-14 ENCOUNTER — Ambulatory Visit
Admission: RE | Admit: 2011-05-14 | Discharge: 2011-05-14 | Disposition: A | Discharge status: Home / Self Care | Payer: 59 | Source: Ambulatory Visit | Attending: Obstetrics & Gynecology | Admitting: Obstetrics & Gynecology

## 2011-05-14 DIAGNOSIS — Z1231 Encounter for screening mammogram for malignant neoplasm of breast: Secondary | ICD-10-CM

## 2011-11-16 ENCOUNTER — Telehealth (INDEPENDENT_AMBULATORY_CARE_PROVIDER_SITE_OTHER): Payer: Self-pay | Admitting: General Surgery

## 2011-11-16 DIAGNOSIS — N2889 Other specified disorders of kidney and ureter: Secondary | ICD-10-CM

## 2011-11-16 DIAGNOSIS — E278 Other specified disorders of adrenal gland: Secondary | ICD-10-CM

## 2011-11-16 NOTE — Telephone Encounter (Signed)
Message copied by Latricia Heft on Mon Nov 16, 2011  3:04 PM ------      Message from: Valarie Merino      Created: Fri Nov 13, 2011  5:09 PM       Yes,      I would schedule if we have talked about getting it                  ----- Message -----         From: Latricia Heft, CMA         Sent: 11/11/2011   3:15 PM           To: Valarie Merino, MD            Is this something you historically do for this patient? Please advise me if I need to schedule this?      ----- Message -----         From: Zacarias Pontes         Sent: 11/10/2011   3:39 PM           To: Latricia Heft, CMA            Pt needs apt for a CT Scan?for a  2 year reck on rt kidney growth that Dr MM is watching..you can reach her at [phone]

## 2011-11-24 ENCOUNTER — Telehealth (INDEPENDENT_AMBULATORY_CARE_PROVIDER_SITE_OTHER): Payer: Self-pay

## 2011-11-24 NOTE — Telephone Encounter (Signed)
Rec'd call regarding CT scheduled, [facility] wanted to confirm we ordered imaging studies on her Adrenal Gland.

## 2011-11-25 ENCOUNTER — Ambulatory Visit
Admission: RE | Admit: 2011-11-25 | Discharge: 2011-11-25 | Disposition: A | Discharge status: Home / Self Care | Payer: 59 | Source: Ambulatory Visit | Attending: Surgery | Admitting: Surgery

## 2011-11-25 DIAGNOSIS — E278 Other specified disorders of adrenal gland: Secondary | ICD-10-CM

## 2011-11-25 MED ORDER — IOHEXOL 300 MG/ML  SOLN
100.0000 mL | Freq: Once | INTRAMUSCULAR | Status: AC | PRN
  Administered 2011-11-25: 100 mL via INTRAVENOUS

## 2011-12-18 ENCOUNTER — Ambulatory Visit (INDEPENDENT_AMBULATORY_CARE_PROVIDER_SITE_OTHER): Payer: 59 | Admitting: Surgery

## 2011-12-18 ENCOUNTER — Encounter (INDEPENDENT_AMBULATORY_CARE_PROVIDER_SITE_OTHER): Payer: Self-pay | Admitting: Surgery

## 2011-12-18 VITALS — BP 140/86 | HR 80 | Temp 97.7°F | Resp 12 | Ht 66.0 in | Wt 165.6 lb

## 2011-12-18 DIAGNOSIS — E278 Other specified disorders of adrenal gland: Secondary | ICD-10-CM

## 2011-12-18 DIAGNOSIS — E279 Disorder of adrenal gland, unspecified: Secondary | ICD-10-CM

## 2011-12-18 NOTE — Progress Notes (Signed)
[name] 56 y.o.  Body mass index is 26.73 kg/(m^2).  There is no problem list on file for this patient.   Allergies  Allergen Reactions  . Codeine     Past Surgical History  Procedure Date  . Abdominal hysterectomy   . Cesarean section   . Cholecystectomy   . Colon resection   . Cystectomy    [name], [name] Stalker, MD No diagnosis found.  Followup exam:  [name] comes in today in followup. She has done a great job with weight loss has lost about 55 pounds overall. We previously discussed baritatric surgery. After with her according to her chart back in 2010. His ampulla we had done a CT scan showing his adrenal nodule be stable. Recent CT scan shows again this right adrenal nodule to be stable. She occasionally has some right-sided bowel pain but not too bad. She looks great and she stands fit. She's done this by taking a low, high-grade. I tore I be happy to see her again whenever needed. Her she may want to repeat her CT scan in a few years.  Impression benign adrenal adenoma. Stable in size. Matt B. Daphine Deutscher, MD, [facility] [phone] beeper [phone]  12/18/2011 3:25 PM

## 2011-12-18 NOTE — Patient Instructions (Signed)
CT scan stable.  Could wait a few years to repeat.   Will be glad to see if needed.

## 2012-04-25 ENCOUNTER — Other Ambulatory Visit: Payer: Self-pay | Admitting: Obstetrics & Gynecology

## 2012-04-25 DIAGNOSIS — Z1231 Encounter for screening mammogram for malignant neoplasm of breast: Secondary | ICD-10-CM

## 2012-05-20 ENCOUNTER — Ambulatory Visit
Admission: RE | Admit: 2012-05-20 | Discharge: 2012-05-20 | Disposition: A | Discharge status: Home / Self Care | Payer: 59 | Source: Ambulatory Visit | Attending: Obstetrics & Gynecology | Admitting: Obstetrics & Gynecology

## 2012-05-20 DIAGNOSIS — Z1231 Encounter for screening mammogram for malignant neoplasm of breast: Secondary | ICD-10-CM

## 2012-08-05 ENCOUNTER — Encounter (INDEPENDENT_AMBULATORY_CARE_PROVIDER_SITE_OTHER): Payer: Self-pay | Admitting: Surgery

## 2012-08-05 ENCOUNTER — Ambulatory Visit (INDEPENDENT_AMBULATORY_CARE_PROVIDER_SITE_OTHER): Payer: 59 | Admitting: Surgery

## 2012-08-05 VITALS — BP 127/77 | HR 70 | Temp 98.1°F | Resp 14 | Ht 65.5 in | Wt 172.0 lb

## 2012-08-05 DIAGNOSIS — R1031 Right lower quadrant pain: Secondary | ICD-10-CM

## 2012-08-05 DIAGNOSIS — Z9889 Other specified postprocedural states: Secondary | ICD-10-CM

## 2012-08-05 DIAGNOSIS — Z9049 Acquired absence of other specified parts of digestive tract: Secondary | ICD-10-CM

## 2012-08-05 MED ORDER — OXYCODONE-ACETAMINOPHEN 5-325 MG PO TABS: 1.0000 | ORAL_TABLET | ORAL | Status: DC | PRN

## 2012-08-05 NOTE — Progress Notes (Signed)
Chief Complaint:  Right lower quadrant pain severe since last week in  History of Present Illness:  [name] is an 57 y.o. female Who was doing a lot of yardwork last week and and afterwards noticed severe right lower quadrant abdominal pain. This is causing him a lot of aching in the right lower quadrant and a times some nausea. She's been very uncomfortable unable to rest. In 2003 I performed an open placement of Davol 3-D max mesh while [name] Was doing a Burch procedure. I reviewed her CT scan from last August and I think the hyperlucent area there in the right inguinal region is some compressed mesh in that region. I do not feel a direct hernia at this time on exam. I think because she's having so much pain I will get a repeat CT scan also give her something for pain  Past Medical History  Diagnosis Date  . Hypertension     Past Surgical History  Procedure Laterality Date  . Abdominal hysterectomy    . Cesarean section    . Cholecystectomy    . Colon resection    . Cystectomy      Current Outpatient Prescriptions  Medication Sig Dispense Refill  . acyclovir (ZOVIRAX) 400 MG tablet       . losartan (COZAAR) 100 MG tablet       . Vitamin D, Ergocalciferol, (DRISDOL) 50000 UNITS CAPS        No current facility-administered medications for this visit.   Codeine Family History  Problem Relation Age of Onset  . Skin cancer    . Lung cancer Father    Social History:   reports that she has never smoked. She does not have any smokeless tobacco history on file. She reports that  drinks alcohol. She reports that she does not use illicit drugs.   REVIEW OF SYSTEMS - PERTINENT POSITIVES ONLY: noncontributory  Physical Exam:   Blood pressure 127/77, pulse 70, temperature 98.1 F (36.7 C), temperature source Temporal, resp. rate 14, height 5' 5.5" (1.664 m), weight 172 lb (78.019 kg). Body mass index is 28.18 kg/(m^2).  Gen:  WDWNWF NAD  Neurological: Alert and oriented  to person, place, and time. Motor and sensory function is grossly intact  Head: Normocephalic and atraumatic.  Eyes: Conjunctivae are normal. Pupils are equal, round, and reactive to light. No scleral icterus.  Neck: Normal range of motion. Neck supple. No tracheal deviation or thyromegaly present.  Cardiovascular:  SR without murmurs or gallops.  No carotid bruits Respiratory: Effort normal.  No respiratory distress. No chest wall tenderness. Breath sounds normal.  No wheezes, rales or rhonchi.  Abdomen:  She has tenderness in this Pfannenstiel crease incision time by Dr. Elana Alm. I don't feel a definite bulge with coughs or Valsalva. It could be she's pull the insertion of her mesh GU: Musculoskeletal: Normal range of motion. Extremities are nontender. No cyanosis, edema or clubbing noted Lymphadenopathy: No cervical, preauricular, postauricular or axillary adenopathy is present Skin: Skin is warm and dry. No rash noted. No diaphoresis. No erythema. No pallor. Pscyh: Normal mood and affect. Behavior is normal. Judgment and thought content normal.   LABORATORY RESULTS: No results found for this or any previous visit (from the past 48 hour(s)).  RADIOLOGY RESULTS: No results found.  Problem List: Patient Active Problem List  Diagnosis  . S/P right inguinal hernia repair-Davol 3D max mesh 2003  . S/P laparoscopic cholecystectomy-2002    Assessment & Plan: Pain and  right inguinal region after exercise possibly related to pulling previously placed mesh. We'll get a repeat CT scan.    Matt B. Daphine Deutscher, MD, [facility] [phone] beeper [phone]  08/05/2012 11:40 AM

## 2012-08-09 ENCOUNTER — Other Ambulatory Visit: Payer: Self-pay | Admitting: *Deleted

## 2012-08-09 MED ORDER — ESTRADIOL 0.075 MG/24HR TD PTTW: 1.0000 | MEDICATED_PATCH | TRANSDERMAL | Status: DC

## 2012-08-09 NOTE — Telephone Encounter (Signed)
sent 

## 2012-08-10 ENCOUNTER — Other Ambulatory Visit: Payer: Self-pay | Admitting: Physician Assistant

## 2012-08-10 ENCOUNTER — Ambulatory Visit
Admission: RE | Admit: 2012-08-10 | Discharge: 2012-08-10 | Disposition: A | Discharge status: Home / Self Care | Payer: 59 | Source: Ambulatory Visit | Attending: Surgery | Admitting: Surgery

## 2012-08-10 DIAGNOSIS — Z8719 Personal history of other diseases of the digestive system: Secondary | ICD-10-CM

## 2012-08-10 DIAGNOSIS — Z9049 Acquired absence of other specified parts of digestive tract: Secondary | ICD-10-CM

## 2012-08-10 MED ORDER — IOHEXOL 300 MG/ML  SOLN
100.0000 mL | Freq: Once | INTRAMUSCULAR | Status: AC | PRN
  Administered 2012-08-10: 100 mL via INTRAVENOUS

## 2012-08-10 NOTE — Telephone Encounter (Signed)
Pt needs to be seen before next refill, script rf per protocol

## 2012-09-08 ENCOUNTER — Ambulatory Visit (INDEPENDENT_AMBULATORY_CARE_PROVIDER_SITE_OTHER): Payer: 59 | Admitting: Surgery

## 2012-09-08 ENCOUNTER — Encounter (INDEPENDENT_AMBULATORY_CARE_PROVIDER_SITE_OTHER): Payer: Self-pay | Admitting: Surgery

## 2012-09-08 VITALS — BP 132/78 | HR 86 | Temp 98.4°F | Resp 18 | Ht 65.5 in | Wt 173.0 lb

## 2012-09-08 DIAGNOSIS — R109 Unspecified abdominal pain: Secondary | ICD-10-CM

## 2012-09-08 DIAGNOSIS — Z8719 Personal history of other diseases of the digestive system: Secondary | ICD-10-CM

## 2012-09-08 NOTE — Progress Notes (Signed)
[name] 57 y.o.  Body mass index is 28.34 kg/(m^2).  Patient Active Problem List   Diagnosis Date Noted  . S/P right inguinal hernia repair-Davol 3D max mesh 2003 08/05/2012  . S/P laparoscopic cholecystectomy-2002 08/05/2012    Allergies  Allergen Reactions  . Codeine     Past Surgical History  Procedure Laterality Date  . Abdominal hysterectomy    . Cesarean section    . Cholecystectomy    . Colon resection    . Cystectomy     [name], Amy Stalker, MD No diagnosis found.  We reviewed her CT scan which did not show any evidence of recurrent hernia or any explanation for her right-sided abdominal pain. She had had a partial colectomy years ago during female surgery by Dr. Elana Haley when some endometriosis was found involving her colon. That was done by [name]. [name] has recommended surveillance colonoscopy which I would agree is indicated.  I will see her in followup as needed.  Copy of CT scan report given to her.  Amy B. Daphine Deutscher, MD, [facility] [phone] beeper [phone]  09/08/2012 12:21 PM

## 2012-09-08 NOTE — Patient Instructions (Addendum)
Thanks for your patience.  If you need further assistance after leaving the office, please call our office and speak with a [facility] nurse.  [phone].  If you want to leave a message for Dr. Nickol Collister, please call his office phone at [phone]. 

## 2012-09-09 ENCOUNTER — Other Ambulatory Visit: Payer: Self-pay

## 2012-09-09 MED ORDER — ESTRADIOL 0.075 MG/24HR TD PTTW: 1.0000 | MEDICATED_PATCH | TRANSDERMAL | Status: DC

## 2012-09-28 ENCOUNTER — Encounter (INDEPENDENT_AMBULATORY_CARE_PROVIDER_SITE_OTHER): Payer: 59 | Admitting: Surgery

## 2012-10-03 ENCOUNTER — Encounter (INDEPENDENT_AMBULATORY_CARE_PROVIDER_SITE_OTHER): Payer: Self-pay

## 2012-10-04 ENCOUNTER — Other Ambulatory Visit: Payer: Self-pay | Admitting: *Deleted

## 2012-10-04 MED ORDER — ESTRADIOL 0.075 MG/24HR TD PTTW: 1.0000 | MEDICATED_PATCH | TRANSDERMAL | Status: DC

## 2012-10-04 NOTE — Telephone Encounter (Signed)
Last Aex 09/29/11 Next Aex 10/25/12

## 2012-10-17 ENCOUNTER — Telehealth: Payer: Self-pay | Admitting: Obstetrics & Gynecology

## 2012-10-17 NOTE — Telephone Encounter (Signed)
Patient requesting 2 samples of vivelle .075 patch if possible. If no samples, rx to [facility].

## 2012-10-17 NOTE — Telephone Encounter (Signed)
Spoke with pt to is almost due for her Aex. Rx has run out for vivelle patch and pt just needs 2 to get her through to exam. Pt wants to discuss changing the dosage when she comes in to see SM. Advised we have her dosage in Minivelle patch, which is similar to vivelle, but that some people complain that it does not stick as well. Pt willing to try minivelle sample just to get through to appt. Minivelle 0.075 mg 1 sample box containing 2 patches,  Lot # [id number] with expiration date 05-2014 put up front at desk for pt to pick up.

## 2012-10-25 ENCOUNTER — Encounter: Payer: Self-pay | Admitting: Obstetrics & Gynecology

## 2012-10-25 ENCOUNTER — Ambulatory Visit (INDEPENDENT_AMBULATORY_CARE_PROVIDER_SITE_OTHER): Payer: 59 | Admitting: Obstetrics & Gynecology

## 2012-10-25 VITALS — BP 128/66 | HR 68 | Resp 14 | Ht 65.0 in | Wt 174.4 lb

## 2012-10-25 DIAGNOSIS — Z01419 Encounter for gynecological examination (general) (routine) without abnormal findings: Secondary | ICD-10-CM

## 2012-10-25 DIAGNOSIS — Z0000 Encounter for general adult medical examination without abnormal findings: Secondary | ICD-10-CM

## 2012-10-25 LAB — POCT URINALYSIS DIPSTICK
Spec Grav, UA: 1.02
Urobilinogen, UA: NEGATIVE

## 2012-10-25 MED ORDER — ESTRADIOL 0.1 MG/24HR TD PTTW: 1.0000 | MEDICATED_PATCH | TRANSDERMAL | Status: DC

## 2012-10-25 MED ORDER — PROGESTERONE MICRONIZED 200 MG PO CAPS: 200.0000 mg | ORAL_CAPSULE | Freq: Every day | ORAL | Status: DC

## 2012-10-25 NOTE — Progress Notes (Signed)
57 y.o. G2P2 Married Caucasian Fe here for annual exam.  No vaginal bleeding.  Has kept off of most of her weight.  Reports she "tore" the hernia patch.  This has healed.  She also had a colonoscopy during this time.  She is not doing much better.  No vaginal bleeding during any of this time.  H/O LAVH/RSO.  She has noticed some increased vaginal dryness and hair thinning.   No LMP recorded. Patient has had a hysterectomy.          Sexually active: yes  The current method of family planning is status post hysterectomy.    Exercising: yes  cardio, kickboxing, ellipitical training and weights.  Smoker:  no  Health Maintenance: Pap:  10/22/2010  Normal  MMG:  05/20/2012, 3D Colonoscopy:  09/2012, Dr. Darcus Pester 10 years.   BMD:   never TDaP: 2012 Labs: hgb- 14.4   reports that she has never smoked. She has never used smokeless tobacco. She reports that she drinks about 1.2 ounces of alcohol per week. She reports that she does not use illicit drugs.  Past Medical History  Diagnosis Date  . Hypertension   . Varicose vein     surgery on right leg  . Migraine     h/o migraines, none since hysterectomy  . Endometriosis     Past Surgical History  Procedure Laterality Date  . Abdominal hysterectomy  1994    LAVH/RSO  . Cesarean section    . Cholecystectomy    . Colon resection  11/06    and lap LSO  . Cystectomy    . Hernia repair  1982  . Hernia repair  2003    and bladder repair  . Torn hernia      Current Outpatient Prescriptions  Medication Sig Dispense Refill  . acyclovir (ZOVIRAX) 400 MG tablet       . aspirin 81 MG tablet Take 81 mg by mouth daily.      Marland Kitchen CALCIUM PO Take by mouth daily.      . Coenzyme Q10 (CO Q-10) 300 MG CAPS Take by mouth. Occasionally      . estradiol (VIVELLE-DOT) 0.075 MG/24HR Place 1 patch onto the skin 2 (two) times a week.  8 patch  0  . KRILL OIL PO Take by mouth. Omega red krill oil      . losartan (COZAAR) 100 MG tablet take 1 tablet  by mouth once daily  30 tablet  0  . progesterone (PROMETRIUM) 200 MG capsule Take 200 mg by mouth daily.      . Red Yeast Rice Extract (RED YEAST RICE PO) Take by mouth daily.      . vitamin C (ASCORBIC ACID) 500 MG tablet Take 500 mg by mouth 2 (two) times daily.      . Vitamin D, Ergocalciferol, (DRISDOL) 50000 UNITS CAPS every 7 (seven) days.        No current facility-administered medications for this visit.    Family History  Problem Relation Age of Onset  . Skin cancer    . Lung cancer Father   . Spina bifida Daughter     and hydrocephalia  . Diabetes Mother   . Diabetes Sister     x 3  . Hypertension Mother   . Hypertension Sister   . Thyroid nodules Mother     being watched  . Thyroid nodules Sister     ROS:  Pertinent items are noted in HPI.  Otherwise, a comprehensive  ROS was negative.  Exam:   BP 128/66  Pulse 68  Resp 14  Ht 5\' 5"  (1.651 m)  Wt 174 lb 6.4 oz (79.107 kg)  BMI 29.02 kg/m2 Height: 5\' 5"  (165.1 cm)  Ht Readings from Last 3 Encounters:  10/25/12 5\' 5"  (1.651 m)  09/08/12 5' 5.5" (1.664 m)  08/05/12 5' 5.5" (1.664 m)    General appearance: alert, cooperative and appears stated age Head: Normocephalic, without obvious abnormality, atraumatic Neck: no adenopathy, supple, symmetrical, trachea midline and thyroid normal to inspection and palpation Lungs: clear to auscultation bilaterally Breasts: normal appearance, no masses or tenderness Heart: regular rate and rhythm Abdomen: soft, non-tender; no masses,  no organomegaly Extremities: extremities normal, atraumatic, no cyanosis or edema Skin: Skin color, texture, turgor normal. No rashes or lesions Lymph nodes: Cervical, supraclavicular, and axillary nodes normal. No abnormal inguinal nodes palpated Neurologic: Grossly normal   Pelvic: External genitalia:  no lesions              Urethra:  normal appearing urethra with no masses, tenderness or lesions              Bartholin's and Skene's:  normal                 Vagina: normal appearing vagina with normal color and discharge, no lesions              Cervix: no lesions              Pap taken: no Bimanual Exam:  Uterus:  uterus absent              Adnexa: no mass, fullness, tenderness               Rectovaginal: Confirms               Anus:  normal sphincter tone, no lesions  A:  Well Woman with normal exam PMP, on HRT H/O LAVH/RSO, then later LSO due to endometriosis Increased symptoms with lower HRT dosage Htn Elevated lipids Low Vit D  P:   Mammogram yearly.  Patient doing 3D No Pap.  LAVH hx. Vit D today. Patient will return for FLP RX for Vivelle dot 0.1 mg twice weekly and Prometrium 200mg  daily to pharamcy. return annually or prn  An After Visit Summary was printed and given to the patient.

## 2012-10-25 NOTE — Patient Instructions (Signed)

## 2012-10-26 LAB — VITAMIN D 25 HYDROXY (VIT D DEFICIENCY, FRACTURES): Vit D, 25-Hydroxy: 37 ng/mL (ref 30–89)

## 2012-10-29 ENCOUNTER — Other Ambulatory Visit: Payer: Self-pay | Admitting: Obstetrics & Gynecology

## 2012-10-29 MED ORDER — VITAMIN D (ERGOCALCIFEROL) 1.25 MG (50000 UNIT) PO CAPS: 50000.0000 [IU] | ORAL_CAPSULE | ORAL | Status: DC

## 2012-10-31 ENCOUNTER — Telehealth: Payer: Self-pay

## 2012-10-31 ENCOUNTER — Other Ambulatory Visit: Payer: 59

## 2012-10-31 MED ORDER — VITAMIN D (ERGOCALCIFEROL) 1.25 MG (50000 UNIT) PO CAPS: 50000.0000 [IU] | ORAL_CAPSULE | ORAL | Status: DC

## 2012-10-31 NOTE — Telephone Encounter (Signed)
Patient notified of vit d results. Refills for rx were sent to wrong pharmacy, corrected//kn

## 2012-11-09 ENCOUNTER — Other Ambulatory Visit (INDEPENDENT_AMBULATORY_CARE_PROVIDER_SITE_OTHER): Payer: 59

## 2012-11-09 DIAGNOSIS — Z0000 Encounter for general adult medical examination without abnormal findings: Secondary | ICD-10-CM

## 2012-11-09 LAB — LIPID PANEL
HDL: 62 mg/dL (ref >39)
Total CHOL/HDL Ratio: 3.6 Ratio
VLDL: 15 mg/dL (ref 0–40)

## 2012-11-10 NOTE — Telephone Encounter (Signed)
Pt's calling to find out where her Vitamin D Rx was sent. [facility] in [place] says it is not there.

## 2012-11-11 ENCOUNTER — Other Ambulatory Visit: Payer: Self-pay | Admitting: Orthopedic Surgery

## 2012-11-11 NOTE — Telephone Encounter (Signed)
Spoke with pt about Rx for Vit D. Advised it is at [facility], with 4 refills. Pt appreciative.

## 2012-11-11 NOTE — Telephone Encounter (Signed)
Spoke with pharmacist at [facility] to clarify Rx for Vit D. Pharmacist says the Rx is there.

## 2012-11-14 ENCOUNTER — Telehealth: Payer: Self-pay

## 2012-11-14 NOTE — Telephone Encounter (Signed)
7/21 lmtcb//kn 

## 2012-11-14 NOTE — Telephone Encounter (Signed)
Message copied by Elisha Headland on Mon Nov 14, 2012  4:09 PM ------      Message from: Jerene Bears      Created: Mon Nov 14, 2012  1:32 PM       Please inform levels higher than 6/13.  Total 222.  Was 193.  LDL 145.  Was 123.  HDLs good.  Triglycerides good.  Ok to watch.  Recheck next year. ------

## 2012-11-15 ENCOUNTER — Telehealth: Payer: Self-pay | Admitting: Obstetrics & Gynecology

## 2012-11-15 NOTE — Telephone Encounter (Signed)
Patient notified of all results. 

## 2012-11-16 ENCOUNTER — Ambulatory Visit (INDEPENDENT_AMBULATORY_CARE_PROVIDER_SITE_OTHER): Payer: 59 | Admitting: Physician Assistant

## 2012-11-16 ENCOUNTER — Encounter: Payer: Self-pay | Admitting: Physician Assistant

## 2012-11-16 VITALS — BP 152/100 | HR 76 | Temp 98.2°F | Resp 19 | Ht 65.25 in | Wt 176.0 lb

## 2012-11-16 DIAGNOSIS — M26629 Arthralgia of temporomandibular joint, unspecified side: Secondary | ICD-10-CM

## 2012-11-16 DIAGNOSIS — I10 Essential (primary) hypertension: Secondary | ICD-10-CM

## 2012-11-16 MED ORDER — LOSARTAN POTASSIUM 100 MG PO TABS: 100.0000 mg | ORAL_TABLET | Freq: Every day | ORAL | Status: DC

## 2012-11-16 MED ORDER — HYDROCHLOROTHIAZIDE 25 MG PO TABS: 25.0000 mg | ORAL_TABLET | Freq: Every day | ORAL | Status: DC

## 2012-11-17 ENCOUNTER — Encounter: Payer: Self-pay | Admitting: Family Medicine

## 2012-11-17 LAB — BASIC METABOLIC PANEL WITH GFR
BUN: 13 mg/dL (ref 6–23)
Calcium: 9.2 mg/dL (ref 8.4–10.5)
GFR, Est African American: >89 mL/min
GFR, Est Non African American: >89 mL/min
Glucose, Bld: 107 mg/dL — ABNORMAL HIGH (ref 70–99)
Sodium: 140 mEq/L (ref 135–145)

## 2012-11-17 NOTE — Progress Notes (Signed)
Patient ID: [name] MRN: [id number], DOB: December 09, 1955, 57 y.o. Date of Encounter: 11/17/2012, 7:17 AM    Chief Complaint:  Chief Complaint  Patient presents with  . Medication Refill    BP meds  . c/o right ear ache off/on x 2 mths     HPI: 57 y.o. year old white female here for f/u of HTN and also pain in right ear.   In past she had lost significant weight with diet and exercise and was able to help control her HTN and HLD with this. Today she reports that 2 months ago she injured hernia patch-could not exercise for 2 months. She saw Rhea Pink, Gyn 1 weeka ago- Vit D was finally up to 37 and chol was a little high. Husband (EMT) has been checking her BP- 130-140/80s.  In Dec 2013 pt had weaned off Cozaar- b/c BP controlled with diet , exercise.  She had some pill left, so she restarted Cozaar QD for past 2 weeks.   Right ear has been hurting off and on for past 2 months. Some days has no pain at all.      Home Meds: See attached medication section for any medications that were entered at today's visit. The computer does not put those onto this list.The following list is a list of meds entered prior to today's visit.   Current Outpatient Prescriptions on File Prior to Visit  Medication Sig Dispense Refill  . aspirin 81 MG tablet Take 81 mg by mouth daily.      Marland Kitchen CALCIUM PO Take by mouth daily.      . Coenzyme Q10 (CO Q-10) 300 MG CAPS Take by mouth. Occasionally      . estradiol (VIVELLE-DOT) 0.1 MG/24HR Place 1 patch (0.1 mg total) onto the skin 2 (two) times a week.  8 patch  12  . KRILL OIL PO Take by mouth. Omega red krill oil      . progesterone (PROMETRIUM) 200 MG capsule Take 1 capsule (200 mg total) by mouth daily.  30 capsule  13  . Red Yeast Rice Extract (RED YEAST RICE PO) Take by mouth daily.      . vitamin C (ASCORBIC ACID) 500 MG tablet Take 500 mg by mouth 2 (two) times daily.      . Vitamin D, Ergocalciferol, (DRISDOL) 50000 UNITS CAPS Take 1 capsule  (50,000 Units total) by mouth every 7 (seven) days.  12 capsule  4  . acyclovir (ZOVIRAX) 400 MG tablet        No current facility-administered medications on file prior to visit.    Allergies:  Allergies  Allergen Reactions  . Betadine (Povidone Iodine) Itching  . Codeine       Review of Systems: See HPI for pertinent ROS. All other ROS negative.    Physical Exam: Blood pressure 152/100, pulse 76, temperature 98.2 F (36.8 C), temperature source Oral, resp. rate 19, height 5' 5.25" (1.657 m), weight 176 lb (79.833 kg)., Body mass index is 29.08 kg/(m^2).  BP BY ME IS 150/98 loud and clear.  General: WF.  Appears in no acute distress. HEENT: Normocephalic, atraumatic, eyes without discharge, sclera non-icteric, nares are without discharge. Bilateral auditory canals clear, TM's are without perforation, pearly grey and translucent with reflective cone of light bilaterally. Oral cavity moist, posterior pharynx without exudate, erythema, peritonsillar abscess, or post nasal drip. Right TM joint is very tender with palpation.  THere is no pain with palpation of left TM joint.  Neck: Supple. No thyromegaly. No lymphadenopathy.No carotid bruit. Lungs: Clear bilaterally to auscultation without wheezes, rales, or rhonchi. Breathing is unlabored. Heart: Regular rhythm. No murmurs, rubs, or gallops. Msk:  Strength and tone normal for age. Extremities/Skin: Warm and dry.  No edema. Neuro: Alert and oriented X 3. Moves all extremities spontaneously. Gait is normal. CNII-XII grossly in tact. Psych:  Responds to questions appropriately with a normal affect.     ASSESSMENT AND PLAN:  57 y.o. year old female with  1. Hypertension Will increase Cozaar to 100mg  and add back HCTZ. Check BMET. RTC in 2 weeks to recheck BP and BMET onthese meds.  - hydrochlorothiazide (HYDRODIURIL) 25 MG tablet; Take 1 tablet (25 mg total) by mouth daily.  Dispense: 90 tablet; Refill: 1 - losartan (COZAAR) 100 MG  tablet; Take 1 tablet (100 mg total) by mouth daily.  Dispense: 30 tablet; Refill: 5 - BASIC METABOLIC PANEL WITH GFR  2. TMJ arthralgia Take small bites only. Avoid large sandwiches/burgers etc.  Eat soft foods. Avoid hard crunchy foods.  NSIADS with food prn.  She is not aware of clenching/grinding hre teeth when she sleeps. If does not resolve, f/u with Dentist or TMJ specialist.    Signed, Shon Hale [place], [place], [facility] 11/17/2012 7:17 AM

## 2012-11-23 ENCOUNTER — Encounter (INDEPENDENT_AMBULATORY_CARE_PROVIDER_SITE_OTHER): Payer: Self-pay

## 2012-11-30 ENCOUNTER — Encounter: Payer: Self-pay | Admitting: Physician Assistant

## 2012-11-30 ENCOUNTER — Ambulatory Visit (INDEPENDENT_AMBULATORY_CARE_PROVIDER_SITE_OTHER): Payer: 59 | Admitting: Physician Assistant

## 2012-11-30 VITALS — BP 120/86 | HR 76 | Temp 98.6°F | Resp 18 | Wt 175.0 lb

## 2012-11-30 DIAGNOSIS — I10 Essential (primary) hypertension: Secondary | ICD-10-CM

## 2012-11-30 LAB — BASIC METABOLIC PANEL WITH GFR
BUN: 13 mg/dL (ref 6–23)
Creat: 0.65 mg/dL (ref 0.50–1.10)
GFR, Est African American: >89 mL/min
GFR, Est Non African American: >89 mL/min
Potassium: 3.9 mEq/L (ref 3.5–5.3)

## 2012-11-30 NOTE — Progress Notes (Signed)
Patient ID: [name] MRN: [id number], DOB: [birth year], 56 y.o. Date of Encounter: 11/30/2012, 10:54 AM    Chief Complaint:  Chief Complaint  Patient presents with  . 2 week follow up BP     HPI: 57 y.o. year old white female here to f/u HTN. She did increase Cozaar to 100mg  and did add HCTZ 25mg . Has no adv effects. Bought "a nice" BP cuff. However, has not started checking it yet b/c "didnt really know what BP should be and what time of day to check it.'   Say Dentist-does have TMJ-he is treating.    Home Meds: See attached medication section for any medications that were entered at today's visit. The computer does not put those onto this list.The following list is a list of meds entered prior to today's visit.   Current Outpatient Prescriptions on File Prior to Visit  Medication Sig Dispense Refill  . aspirin 81 MG tablet Take 81 mg by mouth daily.      Marland Kitchen CALCIUM PO Take by mouth daily.      . Coenzyme Q10 (CO Q-10) 300 MG CAPS Take by mouth. Occasionally      . estradiol (VIVELLE-DOT) 0.1 MG/24HR Place 1 patch (0.1 mg total) onto the skin 2 (two) times a week.  8 patch  12  . hydrochlorothiazide (HYDRODIURIL) 25 MG tablet Take 1 tablet (25 mg total) by mouth daily.  90 tablet  1  . KRILL OIL PO Take by mouth. Omega red krill oil      . losartan (COZAAR) 100 MG tablet Take 1 tablet (100 mg total) by mouth daily.  30 tablet  5  . progesterone (PROMETRIUM) 200 MG capsule Take 1 capsule (200 mg total) by mouth daily.  30 capsule  13  . Red Yeast Rice Extract (RED YEAST RICE PO) Take by mouth daily.      . vitamin C (ASCORBIC ACID) 500 MG tablet Take 500 mg by mouth 2 (two) times daily.      . Vitamin D, Ergocalciferol, (DRISDOL) 50000 UNITS CAPS Take 1 capsule (50,000 Units total) by mouth every 7 (seven) days.  12 capsule  4  . acyclovir (ZOVIRAX) 400 MG tablet        No current facility-administered medications on file prior to visit.    Allergies:  Allergies  Allergen  Reactions  . Betadine (Povidone Iodine) Itching  . Codeine       Review of Systems: See HPI for pertinent ROS. All other ROS negative.    Physical Exam: Blood pressure 120/86, pulse 76, temperature 98.6 F (37 C), temperature source Oral, resp. rate 18, weight 175 lb (79.379 kg)., Body mass index is 28.91 kg/(m^2). General:  WNWD WF. Very Pleasant. Appears in no acute distress. Neck: Supple. No thyromegaly. No lymphadenopathy.No carotid Bruit. Lungs: Clear bilaterally to auscultation without wheezes, rales, or rhonchi. Breathing is unlabored. Heart: Regular rhythm. No murmurs, rubs, or gallops. Msk:  Strength and tone normal for age. Extremities/Skin: Warm and dry.  No edema.  Neuro: Alert and oriented X 3. Moves all extremities spontaneously. Gait is normal. CNII-XII grossly in tact. Psych:  Responds to questions appropriately with a normal affect.     ASSESSMENT AND PLAN:  57 y.o. year old female with  1. Hypertension At Goal. Cont current meds. Check lab on meds. Pt to check BP during her "normal" day. If gets >140/90, schedule f/u OV. If normal, ROV 6 months. - BASIC METABOLIC PANEL WITH GFR  Signed, [address] [place], [place], [facility] 11/30/2012 10:54 AM

## 2013-05-08 ENCOUNTER — Other Ambulatory Visit: Payer: Self-pay

## 2013-05-08 DIAGNOSIS — Z1231 Encounter for screening mammogram for malignant neoplasm of breast: Secondary | ICD-10-CM

## 2013-05-13 ENCOUNTER — Other Ambulatory Visit: Payer: Self-pay | Admitting: Physician Assistant

## 2013-05-15 NOTE — Telephone Encounter (Signed)
6 mth visit early February.  One refill given

## 2013-05-16 ENCOUNTER — Telehealth: Payer: Self-pay | Admitting: Physician Assistant

## 2013-05-16 DIAGNOSIS — I10 Essential (primary) hypertension: Secondary | ICD-10-CM

## 2013-05-16 MED ORDER — HYDROCHLOROTHIAZIDE 25 MG PO TABS: 25.0000 mg | ORAL_TABLET | Freq: Every day | ORAL | Status: DC

## 2013-05-16 MED ORDER — LOSARTAN POTASSIUM 100 MG PO TABS: 100.0000 mg | ORAL_TABLET | Freq: Every day | ORAL | Status: DC

## 2013-05-16 NOTE — Telephone Encounter (Signed)
Pharmacy [facility] on [address] Call back number is [phone] Please call pt she also has a few questions  Pt is needing a refill on Losartan and Hydrochlorothiazide

## 2013-05-16 NOTE — Telephone Encounter (Signed)
6 mth appt in next few weeks.  One mth Rf sent

## 2013-05-25 ENCOUNTER — Ambulatory Visit
Admission: RE | Admit: 2013-05-25 | Discharge: 2013-05-25 | Disposition: A | Discharge status: Home / Self Care | Payer: 59 | Source: Ambulatory Visit

## 2013-05-25 DIAGNOSIS — Z1231 Encounter for screening mammogram for malignant neoplasm of breast: Secondary | ICD-10-CM

## 2013-06-05 ENCOUNTER — Ambulatory Visit (INDEPENDENT_AMBULATORY_CARE_PROVIDER_SITE_OTHER): Payer: 59 | Admitting: Physician Assistant

## 2013-06-05 ENCOUNTER — Encounter: Payer: Self-pay | Admitting: Physician Assistant

## 2013-06-05 VITALS — BP 134/90 | HR 84 | Temp 97.9°F | Resp 18 | Ht 65.25 in | Wt 180.0 lb

## 2013-06-05 DIAGNOSIS — I10 Essential (primary) hypertension: Secondary | ICD-10-CM

## 2013-06-05 LAB — BASIC METABOLIC PANEL WITH GFR
BUN: 11 mg/dL (ref 6–23)
CO2: 29 meq/L (ref 19–32)
Calcium: 9.1 mg/dL (ref 8.4–10.5)
Chloride: 101 mEq/L (ref 96–112)
Creat: 0.58 mg/dL (ref 0.50–1.10)
GFR, Est Non African American: >89 mL/min
GLUCOSE: 95 mg/dL (ref 70–99)
POTASSIUM: 3.8 meq/L (ref 3.5–5.3)
SODIUM: 140 meq/L (ref 135–145)

## 2013-06-05 MED ORDER — HYDROCHLOROTHIAZIDE 25 MG PO TABS: 25.0000 mg | ORAL_TABLET | Freq: Every day | ORAL | Status: DC

## 2013-06-05 MED ORDER — LOSARTAN POTASSIUM 100 MG PO TABS: 100.0000 mg | ORAL_TABLET | Freq: Every day | ORAL | Status: DC

## 2013-06-05 NOTE — Progress Notes (Signed)
Patient ID: Amy OtterKathryn C Chrismer MRN: [id number], DOB: 1955/07/16, 58 y.o. Date of Encounter: 06/05/2013, 9:11 AM    Chief Complaint:  Chief Complaint  Patient presents with  . 6 mth check up    not fasting     HPI: 58 y.o. year old white female followup hypertension.  She is taking both blood pressure medications as directed. She is on Cozaar 100 mg daily and HCTZ 25 mg daily.  She has been checking her blood pressure at home daily. She brought in those readings to review. She says that the last several months have been very stressful. Husband underwent multiple shoulder surgeries. Because he was in a sling she had to do more work around the house etc. As well she did not have time to exercise. She states that her husband was released by orthopedic surgery 2 weeks ago. Therefore she has been able to get back to her exercise. She says that they have a gym in their house. Some days she does the elliptical for 2 or 3 miles. Other days she does kick boxing video and weights. She exercises 3-5 days per week. As well she states that she has an adult handicapped daughter who lives with them. Brought in her blood sugar log. Most readings are borderline high around 138/93.     Home Meds: See attached medication section for any medications that were entered at today's visit. The computer does not put those onto this list.The following list is a list of meds entered prior to today's visit.   Current Outpatient Prescriptions on File Prior to Visit  Medication Sig Dispense Refill  . aspirin 81 MG tablet Take 81 mg by mouth daily.      Marland Kitchen. CALCIUM PO Take by mouth daily.      Marland Kitchen. estradiol (VIVELLE-DOT) 0.1 MG/24HR Place 1 patch (0.1 mg total) onto the skin 2 (two) times a week.  8 patch  12  . KRILL OIL PO Take by mouth. Omega red krill oil      . progesterone (PROMETRIUM) 200 MG capsule Take 1 capsule (200 mg total) by mouth daily.  30 capsule  13  . Red Yeast Rice Extract (RED YEAST RICE PO) Take by mouth  daily.      . vitamin C (ASCORBIC ACID) 500 MG tablet Take 500 mg by mouth 2 (two) times daily.      . Vitamin D, Ergocalciferol, (DRISDOL) 50000 UNITS CAPS Take 1 capsule (50,000 Units total) by mouth every 7 (seven) days.  12 capsule  4  . acyclovir (ZOVIRAX) 400 MG tablet       . Coenzyme Q10 (CO Q-10) 300 MG CAPS Take by mouth. Occasionally       No current facility-administered medications on file prior to visit.    Allergies:  Allergies  Allergen Reactions  . Betadine [Povidone Iodine] Itching  . Codeine       Review of Systems: See HPI for pertinent ROS. All other ROS negative.    Physical Exam: Blood pressure 134/90, pulse 84, temperature 97.9 F (36.6 C), temperature source Oral, resp. rate 18, height 5' 5.25" (1.657 m), weight 180 lb (81.647 kg)., Body mass index is 29.74 kg/(m^2). General:  WNWD WF. Appears in no acute distress. Neck: Supple. No thyromegaly. No lymphadenopathy. No carotid bruits. Lungs: Clear bilaterally to auscultation without wheezes, rales, or rhonchi. Breathing is unlabored. Heart: Regular rhythm. No murmurs, rubs, or gallops. Msk:  Strength and tone normal for age. Extremities/Skin: Warm and dry.  No edema.  Neuro: Alert and oriented X 3. Moves all extremities spontaneously. Gait is normal. CNII-XII grossly in tact. Psych:  Responds to questions appropriately with a normal affect.     ASSESSMENT AND PLAN:  58 y.o. year old female with  1. Hypertension Blood pressures have been borderline high recently. However she anticipates her stress level decreasing and that she should be able to be more compliant with diet and exercise and weight loss. Will continue current blood pressure medications at this time. Continue increase compliance with diet exercise and weight loss. She will continue to check her blood pressures at home and if they do not decrease over the next upcoming months and she can followup sooner. Otherwise plan for followup 6 months. Also  note that she sees Dr. Leda Quail for GYN who manages a lot of her other medical issues. - BASIC METABOLIC PANEL WITH GFR - losartan (COZAAR) 100 MG tablet; Take 1 tablet (100 mg total) by mouth daily.  Dispense: 30 tablet; Refill: 5 - hydrochlorothiazide (HYDRODIURIL) 25 MG tablet; Take 1 tablet (25 mg total) by mouth daily.  Dispense: 30 tablet; Refill: 5   Signed, [address] [place], [place], [facility] 06/05/2013 9:11 AM

## 2013-06-06 ENCOUNTER — Encounter: Payer: Self-pay | Admitting: Family Medicine

## 2013-10-30 ENCOUNTER — Telehealth: Payer: Self-pay | Admitting: *Deleted

## 2013-10-30 MED ORDER — ESTRADIOL 0.1 MG/24HR TD PTTW: 1.0000 | MEDICATED_PATCH | TRANSDERMAL | Status: DC

## 2013-10-30 NOTE — Telephone Encounter (Signed)
Fax From: [facility] for Vivelle Dot 0.1 mg  Last Refilled: AEX 10/25/12 #8/12 Refills  Last mammogram: 05/26/13 Bi-Rads 1 Aex Scheduled: 12/21/13 with Dr. Amedeo GoryMiller  Vivelle Dot 0.1 mg #8 patch with 1 refill sent to pharmacy.  Routed to provider for review, encounter closed.

## 2013-10-31 ENCOUNTER — Other Ambulatory Visit: Payer: Self-pay | Admitting: *Deleted

## 2013-10-31 MED ORDER — PROGESTERONE MICRONIZED 200 MG PO CAPS: 200.0000 mg | ORAL_CAPSULE | Freq: Every day | ORAL | Status: DC

## 2013-10-31 NOTE — Telephone Encounter (Signed)
Last refilled: 10/25/12 #30/13 refills  Last AEX: 10/25/12 with Dr. Charissa BashMiller AEX Scheduled: 12/21/13 with Dr. Cyndia DiverMiller  Okay to refill?  Please Advise.

## 2013-12-04 ENCOUNTER — Encounter: Payer: Self-pay | Admitting: Physician Assistant

## 2013-12-04 ENCOUNTER — Ambulatory Visit (INDEPENDENT_AMBULATORY_CARE_PROVIDER_SITE_OTHER): Payer: 59 | Admitting: Physician Assistant

## 2013-12-04 VITALS — BP 118/78 | HR 78 | Temp 98.1°F | Resp 18 | Ht 65.0 in | Wt 180.0 lb

## 2013-12-04 DIAGNOSIS — B009 Herpesviral infection, unspecified: Secondary | ICD-10-CM

## 2013-12-04 DIAGNOSIS — E559 Vitamin D deficiency, unspecified: Secondary | ICD-10-CM | POA: Insufficient documentation

## 2013-12-04 DIAGNOSIS — I10 Essential (primary) hypertension: Secondary | ICD-10-CM

## 2013-12-04 LAB — BASIC METABOLIC PANEL WITH GFR
BUN: 14 mg/dL (ref 6–23)
CHLORIDE: 100 meq/L (ref 96–112)
CO2: 28 mEq/L (ref 19–32)
Calcium: 9.3 mg/dL (ref 8.4–10.5)
Creat: 0.77 mg/dL (ref 0.50–1.10)
GFR, Est African American: >89 mL/min
GFR, Est Non African American: 86 mL/min
Glucose, Bld: 94 mg/dL (ref 70–99)
POTASSIUM: 3.7 meq/L (ref 3.5–5.3)
Sodium: 137 mEq/L (ref 135–145)

## 2013-12-04 MED ORDER — LOSARTAN POTASSIUM 100 MG PO TABS: 100.0000 mg | ORAL_TABLET | Freq: Every day | ORAL | Status: DC

## 2013-12-04 MED ORDER — HYDROCHLOROTHIAZIDE 25 MG PO TABS: 25.0000 mg | ORAL_TABLET | Freq: Every day | ORAL | Status: DC

## 2013-12-04 NOTE — Progress Notes (Signed)
Patient ID: Amy OtterKathryn C Glore MRN: [id number], DOB: Jun 24, 1955, 58 y.o. Date of Encounter: 12/04/2013, 8:51 AM    Chief Complaint:  Chief Complaint  Patient presents with  . Follow-up    6 mos     HPI: 58 y.o. year old white female followup hypertension.  She is taking both blood pressure medications as directed. She is on Cozaar 100 mg daily and HCTZ 25 mg daily.  She has been checking her blood pressure at home.Gets very good readings there. Says her readings are very similar to the reading today of 118/78.  At her LOV with me he reported that she had not been doing her routine exercise because her husband recently had shoulder surgery and she was having to do a lot of extra things around the house and did not have time for her exercise. Today she says that she recently had to decrease her exercise because she was afraid that she was irritating her hernia. She has had hernia repair twice in the past and says that she definitely does not want to have to have another hernia surgery. Recently has started back with some exercise. Is back to using the elliptical but is not doing weights or aerobics.  At prior OV  she said  that they have a gym in their house. Some days she would do the elliptical for 2 or 3 miles. Other days she did kick boxing video and weights. She did  exercise 3-5 days per week.   As well she states that she has an adult handicapped daughter who lives with them.   No complaints today. Still sees Dr. Leda QuailSuzanne [name] routinely for GYN. Says she actually has an appointment with her soon-- on 12/21/13. (I told her that I know Dr. 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 aspirin 81 MG tablet Take 81 mg by mouth daily.      Marland Kitchen. CALCIUM PO Take by mouth daily.      . Coenzyme Q10 (CO Q-10) 300 MG CAPS Take by mouth. Occasionally      .  estradiol (VIVELLE-DOT) 0.1 MG/24HR patch Place 1 patch (0.1 mg total) onto the skin 2 (two) times a week.  8 patch  1  . hydrochlorothiazide (HYDRODIURIL) 25 MG tablet Take 1 tablet (25 mg total) by mouth daily.  30 tablet  5  . KRILL OIL PO Take by mouth. Omega red krill oil      . losartan (COZAAR) 100 MG tablet Take 1 tablet (100 mg total) by mouth daily.  30 tablet  5  . progesterone (PROMETRIUM) 200 MG capsule Take 1 capsule (200 mg total) by mouth daily.  30 capsule  1  . Red Yeast Rice Extract (RED YEAST RICE PO) Take by mouth daily.      . vitamin C (ASCORBIC ACID) 500 MG tablet Take 500 mg by mouth 2 (two) times daily.      . Vitamin D, Ergocalciferol, (DRISDOL) 50000 UNITS CAPS Take 1 capsule (50,000 Units total) by mouth every 7 (seven) days.  12 capsule  4  . acyclovir (ZOVIRAX) 400 MG tablet        No facility-administered medications prior to visit.     Allergies:  Allergies  Allergen Reactions  . Betadine [Povidone Iodine] Itching  . Codeine  Review of Systems: See HPI for pertinent ROS. All other ROS negative.    Physical Exam: Blood pressure 118/78, pulse 78, temperature 98.1 F (36.7 C), temperature source Oral, resp. rate 18, height 5\' 5"  (1.651 m), weight 180 lb (81.647 kg)., Body mass index is 29.95 kg/(m^2). General:  WNWD WF. Appears in no acute distress. Neck: Supple. No thyromegaly. No lymphadenopathy. No carotid bruits. Lungs: Clear bilaterally to auscultation without wheezes, rales, or rhonchi. Breathing is unlabored. Heart: Regular rhythm. No murmurs, rubs, or gallops. Msk:  Strength and tone normal for age. Extremities/Skin: Warm and dry. No edema.  Neuro: Alert and oriented X 3. Moves all extremities spontaneously. Gait is normal. CNII-XII grossly in tact. Psych:  Responds to questions appropriately with a normal affect.     ASSESSMENT AND PLAN:  58 y.o. year old female with  1. Hypertension Blood pressure is at goal. Cont current meds.  Check labs to monitor.  - BASIC METABOLIC PANEL WITH GFR - losartan (COZAAR) 100 MG tablet; Take 1 tablet (100 mg total) by mouth daily.  Dispense: 30 tablet; Refill: 5 - hydrochlorothiazide (HYDRODIURIL) 25 MG tablet; Take 1 tablet (25 mg total) by mouth daily.  Dispense: 30 tablet; Refill: 5  2. Screening mammogram, breast exam, pelvic exam--Per GYN-- Dr. Hyacinth Meeker  3. Screening Colonoscopy:  Patient reports she had one screening colonoscopy at age 44.--Dr. Buccini  Patient reports that she has had one repeat colonoscopy since then--09/28/2012--says this one was performed secondary to abdominal pain she was experiencing. Says that Dr. Marjorie Smolder told        her that he felt her abdominal pain was secondary to scar tissue secondary to all of her past abdominal surgeries.  4. Immunizations: Influenza: N/A Tetanus: She received a Tdap  booster here 01/29/2011 Pneumonia Vaccine: She has no indication to receive pneumonia vaccine until age 54 Zostavax: Will discuss this at age 48  Routine office visit in 6 months or sooner if needed.  Signed, [address] [place], [place], [facility] 12/04/2013 8:51 AM

## 2013-12-07 ENCOUNTER — Encounter: Payer: Self-pay | Admitting: *Deleted

## 2013-12-21 ENCOUNTER — Encounter: Payer: Self-pay | Admitting: Obstetrics & Gynecology

## 2013-12-21 ENCOUNTER — Ambulatory Visit (INDEPENDENT_AMBULATORY_CARE_PROVIDER_SITE_OTHER): Payer: 59 | Admitting: Obstetrics & Gynecology

## 2013-12-21 VITALS — BP 138/82 | HR 68 | Resp 16 | Ht 65.75 in | Wt 182.0 lb

## 2013-12-21 DIAGNOSIS — Z01419 Encounter for gynecological examination (general) (routine) without abnormal findings: Secondary | ICD-10-CM

## 2013-12-21 DIAGNOSIS — Z0000 Encounter for general adult medical examination without abnormal findings: Secondary | ICD-10-CM

## 2013-12-21 LAB — POCT URINALYSIS DIPSTICK
Bilirubin, UA: NEGATIVE
Glucose, UA: NEGATIVE
Ketones, UA: NEGATIVE
Leukocytes, UA: NEGATIVE
Nitrite, UA: NEGATIVE
PROTEIN UA: NEGATIVE
RBC UA: NEGATIVE
Urobilinogen, UA: NEGATIVE
pH, UA: 5

## 2013-12-21 LAB — TSH: TSH: 0.792 u[IU]/mL (ref 0.350–4.500)

## 2013-12-21 LAB — HEMOGLOBIN, FINGERSTICK: HEMOGLOBIN, FINGERSTICK: 14.6 g/dL (ref 12.0–16.0)

## 2013-12-21 MED ORDER — ESTRADIOL-NORETHINDRONE ACET 1-0.5 MG PO TABS: 1.0000 | ORAL_TABLET | Freq: Every day | ORAL | Status: DC

## 2013-12-21 NOTE — Progress Notes (Signed)
58 y.o. G2P2 MarriedCaucasianF here for annual exam.  Having some trouble with the patch due to irritation with the adhesive.  Would prefer to use something that is not a cream as she has grandchildren.    Husband had bilateral shoulder surgery.  Daughter, son-in-law and kids are living with her right now as they are building a home and sold their house quickly.  Frustrated with her weight right now.    Did not fast this morning.  Now taking 5000 IU OTC to see if this would keep her Vit d level up.  Was 37 in 7/14.  Patient's last menstrual period was 04/27/1992.          Sexually active: Yes.    The current method of family planning is status post hysterectomy.    Exercising: Yes.    eliptical 30-45 mins 3-4 x weekly Smoker:  no  Health Maintenance: Pap:  10/22/10 WNL History of abnormal Pap:  no MMG:  05/25/13 3D-normal Colonoscopy:  2014-repeat in 10 years BMD:   none TDaP:  2012 Screening Labs: today, Hb today: 14.6, Urine today: negative   reports that she has never smoked. She has never used smokeless tobacco. She reports that she drinks about 1.8 - 2.4 ounces of alcohol per week. She reports that she does not use illicit drugs.  Past Medical History  Diagnosis Date  . Varicose vein     surgery on right leg  . Migraine     h/o migraines, none since hysterectomy  . Endometriosis   . Hypertension   . Herpes simplex type 1 infection     Past Surgical History  Procedure Laterality Date  . Abdominal hysterectomy  1994    LAVH/RSO  . Cesarean section    . Cholecystectomy    . Colon resection  11/06    and lap LSO  . Cystectomy    . Hernia repair  1982  . Hernia repair  2003    and bladder repair  . Torn hernia      Current Outpatient Prescriptions  Medication Sig Dispense Refill  . aspirin 81 MG tablet Take 81 mg by mouth daily.      Marland Kitchen CALCIUM PO Take by mouth daily.      . Coenzyme Q10 (CO Q-10) 300 MG CAPS Take by mouth. Occasionally      . estradiol  (VIVELLE-DOT) 0.1 MG/24HR patch Place 1 patch (0.1 mg total) onto the skin 2 (two) times a week.  8 patch  1  . hydrochlorothiazide (HYDRODIURIL) 25 MG tablet Take 1 tablet (25 mg total) by mouth daily.  30 tablet  5  . KRILL OIL PO Take by mouth. Omega red krill oil      . losartan (COZAAR) 100 MG tablet Take 1 tablet (100 mg total) by mouth daily.  30 tablet  5  . progesterone (PROMETRIUM) 200 MG capsule Take 1 capsule (200 mg total) by mouth daily.  30 capsule  1  . Red Yeast Rice Extract (RED YEAST RICE PO) Take by mouth daily.      . vitamin C (ASCORBIC ACID) 500 MG tablet Take 500 mg by mouth 2 (two) times daily.      . Silica GEL        No current facility-administered medications for this visit.    Family History  Problem Relation Age of Onset  . Skin cancer    . Lung cancer Father   . Spina bifida Daughter     and  hydrocephalia  . Diabetes Mother   . Diabetes Sister     x 3  . Hypertension Mother   . Hypertension Sister   . Thyroid nodules Mother     being watched  . Thyroid nodules Sister     ROS:  Pertinent items are noted in HPI.  Otherwise, a comprehensive ROS was negative.  Exam:   BP 138/82  Pulse 68  Resp 16  Ht 5' 5.75" (1.67 m)  Wt 182 lb (82.555 kg)  BMI 29.60 kg/m2  LMP 04/27/1992  Weight change:  +8#Height: 5' 5.75" (167 cm)  Ht Readings from Last 3 Encounters:  12/21/13 5' 5.75" (1.67 m)  12/04/13  (1.651 m)  06/05/13 5' 5.25" (1.657 m)    General appearance: alert, cooperative and appears stated age Head: Normocephalic, without obvious abnormality, atraumatic Neck: no adenopathy, supple, symmetrical, trachea midline and thyroid normal to inspection and palpation Lungs: clear to auscultation bilaterally Breasts: normal appearance, no masses or tenderness Heart: regular rate and rhythm Abdomen: soft, non-tender; bowel sounds normal; no masses,  no organomegaly Extremities: extremities normal, atraumatic, no cyanosis or edema Skin: Skin  color, texture, turgor normal. No rashes or lesions Lymph nodes: Cervical, supraclavicular, and axillary nodes normal. No abnormal inguinal nodes palpated Neurologic: Grossly normal   Pelvic: External genitalia:  no lesions              Urethra:  normal appearing urethra with no masses, tenderness or lesions              Bartholins and Skenes: normal                 Vagina: normal appearing vagina with normal color and discharge, no lesions              Cervix: absent              Pap taken: No. Bimanual Exam:  Uterus:  uterus absent              Adnexa: no mass, fullness, tenderness               Rectovaginal: Confirms               Anus:  normal sphincter tone, no lesions  A:  Well Woman with normal exam  PMP, on HRT.  Will change to oral therapy due to adhesives from patch. H/O LAVH/RSO, then later LSO due to endometriosis  Htn  Elevated lipids  Low Vit D   P: Mammogram yearly. Patient doing 3D  No Pap. LAVH hx.  Vit D, TSH. Will return for fasting lipids.  RX for Mimvey 1.0/0.5 oral tablet to pharamcy.  #30/13. return annually or prn  An After Visit Summary was printed and given to the patient.

## 2013-12-22 LAB — VITAMIN D 25 HYDROXY (VIT D DEFICIENCY, FRACTURES): Vit D, 25-Hydroxy: 23 ng/mL — ABNORMAL LOW (ref 30–89)

## 2013-12-25 ENCOUNTER — Other Ambulatory Visit (INDEPENDENT_AMBULATORY_CARE_PROVIDER_SITE_OTHER): Payer: 59

## 2013-12-25 DIAGNOSIS — Z0000 Encounter for general adult medical examination without abnormal findings: Secondary | ICD-10-CM

## 2013-12-25 LAB — LIPID PANEL
CHOLESTEROL: 204 mg/dL — AB (ref 0–200)
HDL: 55 mg/dL (ref >39)
LDL Cholesterol: 120 mg/dL — ABNORMAL HIGH (ref 0–99)
TRIGLYCERIDES: 144 mg/dL (ref <150)
Total CHOL/HDL Ratio: 3.7 Ratio
VLDL: 29 mg/dL (ref 0–40)

## 2013-12-26 ENCOUNTER — Telehealth: Payer: Self-pay

## 2013-12-26 MED ORDER — VITAMIN D (ERGOCALCIFEROL) 1.25 MG (50000 UNIT) PO CAPS: 50000.0000 [IU] | ORAL_CAPSULE | ORAL | Status: DC

## 2013-12-26 NOTE — Telephone Encounter (Signed)
Message copied by Elisha Headland on Tue Dec 26, 2013  8:45 AM ------      Message from: Jerene Bears      Created: Sun Dec 24, 2013  9:45 PM       Inform TSH normal.  Vit d is 99.  Was 37.  She does need supplementation.  She was on 50K weekly.  Does she want to restart or just take OTC.  Can take 2000-3000IU daily and that should be good.  Will repeat one year. ------

## 2013-12-26 NOTE — Telephone Encounter (Signed)
At AEX is fine.  Thanks.  Encounter closed.

## 2013-12-26 NOTE — Telephone Encounter (Signed)
Patient notified of results. States has been using OTC vitamin d for the last 3 months, so would like to restart the Rx. I sent the rx through x one year. Does she need to have this level recheck since restarting or just check at AEX next year? Please advise.

## 2014-01-02 ENCOUNTER — Other Ambulatory Visit: Payer: Self-pay | Admitting: Obstetrics & Gynecology

## 2014-01-02 NOTE — Telephone Encounter (Signed)
Last AEX: 12/21/13 Last refill: Vivelle-Dot- 10/30/13 #8 patches X 1, Progesterone 10/31/13  Current AEX:01/25/15 Last MMG: 05/26/13 Bi-Rads Neg  Please advise

## 2014-01-09 ENCOUNTER — Telehealth: Payer: Self-pay

## 2014-01-09 NOTE — Telephone Encounter (Signed)
Message copied by Elisha Headland on Tue Jan 09, 2014 10:30 AM ------      Message from: Jerene Bears      Created: Wed Jan 03, 2014  8:56 AM       Please inform this looks fine.  Total 204.  LDL 120.  HDLs good at 55.  TGs normal.  Is actually decreased from 1 year ago.  Repeat 1-2 years.  Always should do fasting from now on. ------

## 2014-01-09 NOTE — Telephone Encounter (Signed)
Returning a call to [name] °

## 2014-01-09 NOTE — Telephone Encounter (Signed)
Lmtcb//kn 

## 2014-01-15 NOTE — Telephone Encounter (Signed)
Patient notified of results.//kn 

## 2014-01-16 ENCOUNTER — Other Ambulatory Visit: Payer: Self-pay | Admitting: Physician Assistant

## 2014-01-16 NOTE — Telephone Encounter (Signed)
Refill appropriate and filled per protocol. 

## 2014-02-26 ENCOUNTER — Encounter: Payer: Self-pay | Admitting: Obstetrics & Gynecology

## 2014-03-24 ENCOUNTER — Other Ambulatory Visit: Payer: Self-pay | Admitting: Physician Assistant

## 2014-03-26 NOTE — Telephone Encounter (Signed)
Medication refilled per protocol. 

## 2014-04-27 DIAGNOSIS — S82899A Other fracture of unspecified lower leg, initial encounter for closed fracture: Secondary | ICD-10-CM

## 2014-04-27 HISTORY — DX: Other fracture of unspecified lower leg, initial encounter for closed fracture: S82.899A

## 2014-05-01 ENCOUNTER — Other Ambulatory Visit: Payer: Self-pay

## 2014-05-01 DIAGNOSIS — Z1231 Encounter for screening mammogram for malignant neoplasm of breast: Secondary | ICD-10-CM

## 2014-05-28 ENCOUNTER — Ambulatory Visit
Admission: RE | Admit: 2014-05-28 | Discharge: 2014-05-28 | Disposition: A | Discharge status: Home / Self Care | Payer: 59 | Source: Ambulatory Visit

## 2014-05-28 ENCOUNTER — Encounter (INDEPENDENT_AMBULATORY_CARE_PROVIDER_SITE_OTHER): Payer: Self-pay

## 2014-05-28 DIAGNOSIS — Z1231 Encounter for screening mammogram for malignant neoplasm of breast: Secondary | ICD-10-CM

## 2014-06-06 ENCOUNTER — Encounter: Payer: Self-pay | Admitting: Physician Assistant

## 2014-06-06 ENCOUNTER — Ambulatory Visit (INDEPENDENT_AMBULATORY_CARE_PROVIDER_SITE_OTHER): Payer: 59 | Admitting: Physician Assistant

## 2014-06-06 VITALS — BP 146/94 | HR 76 | Temp 98.3°F | Resp 18 | Wt 187.0 lb

## 2014-06-06 DIAGNOSIS — J329 Chronic sinusitis, unspecified: Secondary | ICD-10-CM

## 2014-06-06 DIAGNOSIS — E559 Vitamin D deficiency, unspecified: Secondary | ICD-10-CM

## 2014-06-06 DIAGNOSIS — B9789 Other viral agents as the cause of diseases classified elsewhere: Secondary | ICD-10-CM

## 2014-06-06 DIAGNOSIS — I10 Essential (primary) hypertension: Secondary | ICD-10-CM

## 2014-06-06 DIAGNOSIS — B349 Viral infection, unspecified: Secondary | ICD-10-CM

## 2014-06-06 DIAGNOSIS — J069 Acute upper respiratory infection, unspecified: Secondary | ICD-10-CM

## 2014-06-06 LAB — BASIC METABOLIC PANEL WITH GFR
BUN: 14 mg/dL (ref 6–23)
CHLORIDE: 106 meq/L (ref 96–112)
CO2: 26 meq/L (ref 19–32)
Calcium: 9 mg/dL (ref 8.4–10.5)
Creat: 0.6 mg/dL (ref 0.50–1.10)
GFR, Est Non African American: >89 mL/min
Glucose, Bld: 102 mg/dL — ABNORMAL HIGH (ref 70–99)
POTASSIUM: 4.1 meq/L (ref 3.5–5.3)
SODIUM: 141 meq/L (ref 135–145)

## 2014-06-06 MED ORDER — FLUTICASONE PROPIONATE 50 MCG/ACT NA SUSP: 2.0000 | Freq: Every day | NASAL | Status: DC

## 2014-06-06 MED ORDER — PREDNISONE 20 MG PO TABS: ORAL_TABLET | ORAL | Status: DC

## 2014-06-06 NOTE — Progress Notes (Signed)
Patient ID: Amy OtterKathryn C Haley MRN: [id number], DOB: Jun 01, 1955, 59 y.o. Date of Encounter: 06/06/2014, 8:45 AM    Chief Complaint:  Chief Complaint  Patient presents with  . 6 mth BP check    not fasting,  c/o sinus infection     HPI: 59 y.o. year old white female followup hypertension.   Says that this past Saturday her mother had to go the hospital with a stroke. Patient has been visiting her at the hospital since then so has been increased stress.  Asian states that she also has been having sinus pressure under her eyes and behind her eyes since Saturday. Says that she has not been blowing anything out until her nose is just started to drain some clear drainage today. Has had no chest congestion. Says her eyes so felt a little irritated. She doesn't know if it is just because of the heat being on so much recently drying them out. Says she is getting no mucus out of her eyes and says that her eyelids do not have any correct golden crust in the mornings. She is taking Motrin sinus. Her blood pressure is reading high today because of a combination of her sinus medication and sinus issues as well as the stress with her mom. Says that when she has been checking her blood pressure at home she has been getting good readings.  She is taking both blood pressure medications as directed. She is on Cozaar 100 mg daily and HCTZ 25 mg daily.  She has been checking her blood pressure at home.Gets very good readings there.     At her Prior OV with me he reported that she had not been doing her routine exercise because her husband recently had shoulder surgery and she was having to do a lot of extra things around the house and did not have time for her exercise. At LOV she says that she recently had to decrease her exercise because she was afraid that she was irritating her hernia. She has had hernia repair twice in the past and says that she definitely does not want to have to have another hernia  surgery. Recently has started back with some exercise. Is back to using the elliptical but is not doing weights or aerobics.  At prior OV  she said  that they have a gym in their house. Some days she would do the elliptical for 2 or 3 miles. Other days she did kick boxing video and weights. She did  exercise 3-5 days per week.   As well she states that she has an adult handicapped daughter who lives with them.   No complaints today. Still sees Dr. Leda QuailSuzanne [name] routinely for GYN. She says that Dr. Hyacinth MeekerMiller is managing her Vit D Deficiency.  (I told her that I know Dr. 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 aspirin 81 MG tablet Take 81 mg by mouth daily.    Marland Kitchen. CALCIUM PO Take by mouth daily.    . Coenzyme Q10 (CO Q-10) 300 MG CAPS Take by mouth. Occasionally    . estradiol-norethindrone (MIMVEY) 1-0.5 MG per tablet Take 1 tablet by mouth daily. 30 tablet 12  . hydrochlorothiazide (HYDRODIURIL) 25 MG tablet take 1 tablet by mouth once daily 30 tablet 3  . KRILL OIL PO Take by mouth.  Omega red krill oil    . losartan (COZAAR) 100 MG tablet TAKE ONE TABLET BY MUOTH ONCE DAILY 30 tablet 6  . Red Yeast Rice Extract (RED YEAST RICE PO) Take by mouth daily.    . vitamin C (ASCORBIC ACID) 500 MG tablet Take 500 mg by mouth 2 (two) times daily.    . Vitamin D, Ergocalciferol, (DRISDOL) 50000 UNITS CAPS capsule Take 1 capsule (50,000 Units total) by mouth every 7 (seven) days. 12 capsule 4  . progesterone (PROMETRIUM) 200 MG capsule Take 1 capsule (200 mg total) by mouth daily. 30 capsule 1  . Progesterone Wettable POWD DISSOLVE 1 TROCHE UNDER THE TONGUE DAILY AT BEDTIME AS DIRECTED. 1 Bottle 1  . Silica GEL     . VIVELLE-DOT 0.1 MG/24HR patch APPLY 1 PATCH TWICE WEEKLY AS DIRECTED. 8 patch 1   No facility-administered medications prior to visit.      Allergies:  Allergies  Allergen Reactions  . Betadine [Povidone Iodine] Itching  . Codeine   . Other     Band aids-itchy, red rash      Review of Systems: See HPI for pertinent ROS. All other ROS negative.    Physical Exam: Blood pressure 146/94, pulse 76, temperature 98.3 F (36.8 C), temperature source Oral, resp. rate 18, weight 187 lb (84.823 kg), last menstrual period 04/27/1992., Body mass index is 30.41 kg/(m^2). General:  WNWD WF. Appears in no acute distress. HEENT: She does have tenderness with percussion of frontal and maxillary sinuses bilaterally. Exam of ears is normal bilaterally. Throat is normal. Neck: Supple. No thyromegaly. No lymphadenopathy. No carotid bruits. Lungs: Clear bilaterally to auscultation without wheezes, rales, or rhonchi. Breathing is unlabored. Heart: Regular rhythm. No murmurs, rubs, or gallops. Msk:  Strength and tone normal for age. Extremities/Skin: Warm and dry. No edema.  Neuro: Alert and oriented X 3. Moves all extremities spontaneously. Gait is normal. CNII-XII grossly in tact. Psych:  Responds to questions appropriately with a normal affect.     ASSESSMENT AND PLAN:  59 y.o. year old female with  1. Hypertension Blood pressure is good at home. She is to cont to check at home--If gets higher readings, call us. . Cont current meds. Check labs to monitor.  - BASIC METABOLIC PANEL WITH GFR - losartan (COZAAR) 100 MG tablet; Take 1 tablet (100 mg total) by mouth daily.  Dispense: 30 tablet; Refill: 5 - hydrochlorothiazide (HYDRODIURIL) 25 MG tablet; Take 1 tablet (25 mg total) by mouth daily.  Dispense: 30 tablet; Refill: 5  2. Screening mammogram, breast exam, pelvic exam--Per GYN-- Dr. Rubie Maid [facility] manages this. I will NOT recheck here.  3. Screening Colonoscopy:  Patient reports she had one screening colonoscopy at age 66.--Dr. Buccini  Patient reports that she has had one repeat colonoscopy since  then--09/28/2012--says this one was performed secondary to abdominal pain she was experiencing. Says that Dr. Marjorie Smolder told        her that he felt her abdominal pain was secondary to scar tissue secondary to all of her past abdominal surgeries.  4. Immunizations: Influenza: She has received for this season--@ [facility] Tetanus: She received a Tdap  booster here 01/29/2011 Pneumonia Vaccine: She has no indication to receive pneumonia vaccine until age 6 Zostavax: Will discuss this at age 33  Routine office visit in 6 months or sooner if needed.  Signed, [address] [place], [place], [facility] 06/06/2014 8:45 AM

## 2014-08-15 ENCOUNTER — Other Ambulatory Visit: Payer: Self-pay | Admitting: Physician Assistant

## 2014-08-15 NOTE — Telephone Encounter (Signed)
Medication refilled per protocol. 

## 2014-09-20 ENCOUNTER — Ambulatory Visit (INDEPENDENT_AMBULATORY_CARE_PROVIDER_SITE_OTHER): Payer: 59 | Admitting: Physician Assistant

## 2014-09-20 ENCOUNTER — Encounter: Payer: Self-pay | Admitting: Physician Assistant

## 2014-09-20 VITALS — BP 130/70 | HR 75 | Temp 98.1°F | Resp 19 | Wt 189.0 lb

## 2014-09-20 DIAGNOSIS — J029 Acute pharyngitis, unspecified: Secondary | ICD-10-CM

## 2014-09-20 DIAGNOSIS — J020 Streptococcal pharyngitis: Secondary | ICD-10-CM

## 2014-09-20 DIAGNOSIS — R1110 Vomiting, unspecified: Secondary | ICD-10-CM

## 2014-09-20 LAB — RAPID STREP SCREEN (MED CTR MEBANE ONLY): Streptococcus, Group A Screen (Direct): POSITIVE — AB

## 2014-09-20 MED ORDER — AMOXICILLIN-POT CLAVULANATE 875-125 MG PO TABS: 1.0000 | ORAL_TABLET | Freq: Two times a day (BID) | ORAL | Status: DC

## 2014-09-20 MED ORDER — PROMETHAZINE HCL 25 MG RE SUPP: 25.0000 mg | Freq: Four times a day (QID) | RECTAL | Status: DC | PRN

## 2014-09-20 NOTE — Progress Notes (Signed)
Patient ID: [name] MRN: [id number], DOB: 11-29-1955, 59 y.o. Date of Encounter: 09/20/2014, 10:24 AM    Chief Complaint:  Chief Complaint  Patient presents with  . Sore Throat    last friday     HPI: 59 y.o. year old white female ports that she developed a really bad sore throat last Friday (09/14/2014) and it has continued all week. Says that she has also had a bad cough. Says this morning she woke up with a bad headache. At 6 AM she took a Motrin for the headache but she immediately vomited and vomited that up.  Says since then she's been vomiting about every 30 minutes. Is having no abdominal pain. Has had no nausea even up through yesterday and going to bed last night.     Home Meds:   Outpatient Prescriptions Prior to Visit  Medication Sig Dispense Refill  . aspirin 81 MG tablet Take 81 mg by mouth daily.    Marland Kitchen CALCIUM PO Take by mouth daily.    . Coenzyme Q10 (CO Q-10) 300 MG CAPS Take by mouth. Occasionally    . estradiol-norethindrone (MIMVEY) 1-0.5 MG per tablet Take 1 tablet by mouth daily. 30 tablet 12  . hydrochlorothiazide (HYDRODIURIL) 25 MG tablet take 1 tablet by mouth once daily 30 tablet 5  . KRILL OIL PO Take by mouth. Omega red krill oil    . losartan (COZAAR) 100 MG tablet take 1 tablet by mouth once daily 30 tablet 5  . Red Yeast Rice Extract (RED YEAST RICE PO) Take by mouth daily.    . vitamin C (ASCORBIC ACID) 500 MG tablet Take 500 mg by mouth 2 (two) times daily.    . Vitamin D, Ergocalciferol, (DRISDOL) 50000 UNITS CAPS capsule Take 1 capsule (50,000 Units total) by mouth every 7 (seven) days. 12 capsule 4  . fluticasone (FLONASE) 50 MCG/ACT nasal spray Place 2 sprays into both nostrils daily. (Patient not taking: Reported on 09/20/2014) 16 g 6  . predniSONE (DELTASONE) 20 MG tablet Take 3 daily for 2 days, then 2 daily for 2 days, then 1 daily for 2 days. (Patient not taking: Reported on 09/20/2014) 12 tablet 0   No facility-administered  medications prior to visit.    Allergies:  Allergies  Allergen Reactions  . Betadine [Povidone Iodine] Itching  . Codeine   . Other     Band aids-itchy, red rash      Review of Systems: See HPI for pertinent ROS. All other ROS negative.    Physical Exam: Blood pressure 130/70, pulse 75, temperature 98.1 F (36.7 C), temperature source Oral, resp. rate 19, weight 189 lb (85.73 kg), last menstrual period 04/27/1992., Body mass index is 30.74 kg/(m^2). General: WNWD WF.  Appears in no acute distress. HEENT: Normocephalic, atraumatic, eyes without discharge, sclera non-icteric, nares are without discharge. Bilateral auditory canals clear, TM's are without perforation, pearly grey and translucent with reflective cone of light bilaterally. Oral cavity moist. Posterior pharynx and uvula with moderate erythema. No exudate. No peritonsillar abscess.   Neck: Supple. No thyromegaly. She reports some tenderness with palpation of anterior cervical nodes bilaterally. I feel no enlarged nodes with palpation. Lungs: Clear bilaterally to auscultation without wheezes, rales, or rhonchi. Breathing is unlabored. Heart: Regular rhythm. No murmurs, rubs, or gallops. Abdomen: Soft, non-tender, non-distended with normoactive bowel sounds. No hepatomegaly. No rebound/guarding. No obvious abdominal masses. No area of tenderness with palpation of the abdomen. Msk:  Strength and tone normal for  age. Extremities/Skin: Warm and dry. No rashes or suspicious lesions. Neuro: Alert and oriented X 3. Moves all extremities spontaneously. Gait is normal. CNII-XII grossly in tact. Psych:  Responds to questions appropriately with a normal affect.     ASSESSMENT AND PLAN:  59 y.o. year old female with  1. Strep pharyngitis - amoxicillin-clavulanate (AUGMENTIN) 875-125 MG per tablet; Take 1 tablet by mouth 2 (two) times daily.  Dispense: 20 tablet; Refill: 0  2. Sorethroat - Rapid Strep Screen  3. Intractable  vomiting with nausea, vomiting of unspecified type - promethazine (PHENERGAN) 25 MG suppository; Place 1 suppository (25 mg total) rectally every 6 (six) hours as needed for nausea or vomiting.  Dispense: 12 each; Refill: 0   Discussed that the strep infection may be causing the nausea and vomiting that she is now experiencing. Or, there is a possibility that she could have a separate GI virus coincidentally. Nonetheless, she needs to use the Phenergan suppository to get the nausea and vomiting controlled. She is to then make sure she can keep down some crackers and bland food. Once she knows that she will not vomit this up, she is to start the antibiotic it and take it as directed and complete all of it. Told her to call us if the nausea and vomiting are not controlled after using the Phenergan.  Murray HodgkinsSigned, Mary Beth Cortez[place], [place], [facility] 09/20/2014 10:24 AM

## 2014-12-03 ENCOUNTER — Encounter: Payer: Self-pay | Admitting: Podiatry

## 2014-12-03 ENCOUNTER — Ambulatory Visit (INDEPENDENT_AMBULATORY_CARE_PROVIDER_SITE_OTHER): Payer: Commercial Managed Care - HMO

## 2014-12-03 ENCOUNTER — Ambulatory Visit (INDEPENDENT_AMBULATORY_CARE_PROVIDER_SITE_OTHER): Payer: Commercial Managed Care - HMO | Admitting: Podiatry

## 2014-12-03 VITALS — BP 147/87 | HR 80 | Resp 12

## 2014-12-03 DIAGNOSIS — T148XXA Other injury of unspecified body region, initial encounter: Secondary | ICD-10-CM

## 2014-12-03 DIAGNOSIS — M25571 Pain in right ankle and joints of right foot: Secondary | ICD-10-CM | POA: Diagnosis not present

## 2014-12-03 DIAGNOSIS — M25371 Other instability, right ankle: Secondary | ICD-10-CM | POA: Diagnosis not present

## 2014-12-03 DIAGNOSIS — R52 Pain, unspecified: Secondary | ICD-10-CM

## 2014-12-03 DIAGNOSIS — T148 Other injury of unspecified body region: Secondary | ICD-10-CM | POA: Diagnosis not present

## 2014-12-03 NOTE — Progress Notes (Signed)
   Subjective:    Patient ID: [name], female    DOB: 1955-05-14, 59 y.o.   MRN: [id number]  HPI 59 year old female presents to the office with complaints of right ankle pain which has been ongoing for about 7 months and his progressive. She states that she previously was treated by Dr. Elijah Birk in which she had multiple steroid injections and she was given a brace to wear. She states the injections did help somewhat at first however after the last couple she has not had much relief. She is also continue anti-inflammatories. She states that she continues to have pain despite treatment. She was as she made had an injury last year in which she broke her ankle, however this injury appears to be nonspecific and she was trying to correlate to when the pain started. She denies a numbness or tingling. Denies any swelling or redness. No other complaints at this time.  Review of Systems  Musculoskeletal: Positive for gait problem.       Objective:   Physical Exam AAO x3, NAD; presents today wearing a high healed shoe. DP/PT pulses palpable bilaterally, CRT less than 3 seconds Protective sensation intact with Simms Weinstein monofilament, vibratory sensation intact, Achilles tendon reflex intact Procedures palpation along the lateral aspect of the right ankle. There is tenderness to palpation along the ATFL and CFL. There is no pain on the PTFL. There does appear to be some increase in anterior drawer compared with contralateral extremity. There is also mild tenderness to palpation upon lateral aspect of the sinus tarsi as well as the anterior medial and lateral gutter of the ankle joint. Ankle joint motion is intact. Subtalar range of motion with mild discomfort. Metatarsal MTP joint range of motion is intact without pain. No other areas of tenderness to bilateral lower extremities.  Ankle joint plantarflexion is decreased on the right due to pain (however she is able to wear a significant high heel shoe  which seems to help), however intact otherwise.  No open lesions or pre-ulcerative lesions.  No overlying edema, erythema, increase in warmth to bilateral lower extremities.  No pain with calf compression, swelling, warmth, erythema bilaterally.      Assessment & Plan:  59 year old female right ankle pain, possible ankle instability to the ligament tear. -X-rays were obtained and reviewed with the patient.  -Treatment options discussed including all alternatives, risks, and complications. I discussed both conservative and surgical treatment options. -At this time her symptoms do not appear to correlate to the area of "bone chip" on the x-ray which is why she was referred. She has significant pain to the lateral ankle ligaments and there is a positive anterior drawer test. Because of this we'll obtain an MRI to further evaluate the integrity the lateral ankle ligaments. Also rule out osteochondral lesion.  -Continue ankle brace and supportive shoegear at all times. Discussed high heel shoes are not the best for her condition.  -Follow-up after MRI or sooner if any problems arise. In the meantime, encouraged to call the office with any questions, concerns, change in symptoms.    Ovid Curd, DPM

## 2014-12-04 ENCOUNTER — Other Ambulatory Visit: Payer: Self-pay | Admitting: Podiatry

## 2014-12-04 ENCOUNTER — Telehealth: Payer: Self-pay | Admitting: *Deleted

## 2014-12-04 DIAGNOSIS — M25571 Pain in right ankle and joints of right foot: Secondary | ICD-10-CM

## 2014-12-04 NOTE — Telephone Encounter (Signed)
CALLED AND SPOKE WITH CHANEEKA AT [facility] ABOUT PRIOR-AUTHORIZATION FOR RIGHT ANKLE AND THE AUTHORIZATION NUMBER IS [phone] AND CALLED [facility] TO LET THEM KNOW. Yehoshua Vitelli

## 2014-12-06 ENCOUNTER — Ambulatory Visit (INDEPENDENT_AMBULATORY_CARE_PROVIDER_SITE_OTHER): Payer: Commercial Managed Care - HMO | Admitting: Physician Assistant

## 2014-12-06 ENCOUNTER — Encounter: Payer: Self-pay | Admitting: Physician Assistant

## 2014-12-06 VITALS — BP 126/72 | HR 78 | Temp 98.3°F | Resp 12 | Ht 66.0 in | Wt 192.0 lb

## 2014-12-06 DIAGNOSIS — I10 Essential (primary) hypertension: Secondary | ICD-10-CM

## 2014-12-06 LAB — BASIC METABOLIC PANEL WITH GFR
BUN: 16 mg/dL (ref 7–25)
CALCIUM: 9 mg/dL (ref 8.6–10.4)
CHLORIDE: 102 mmol/L (ref 98–110)
CO2: 28 mmol/L (ref 20–31)
Creat: 0.62 mg/dL (ref 0.50–1.05)
GFR, Est African American: >89 mL/min (ref >=60)
GFR, Est Non African American: >89 mL/min (ref >=60)
GLUCOSE: 96 mg/dL (ref 70–99)
POTASSIUM: 3.6 mmol/L (ref 3.5–5.3)
SODIUM: 143 mmol/L (ref 135–146)

## 2014-12-06 NOTE — Progress Notes (Signed)
Patient ID: [name] MRN: [id number], DOB: 05-23-1955, 59 y.o. Date of Encounter: 12/06/2014, 8:11 AM    Chief Complaint:  Chief Complaint  Patient presents with  . 6 month F/U    is not fasting- has had protein shake     HPI: 59 y.o. year old white female followup hypertension.   She reports that this past winter she broke her ankle and had a fracture wore a cast. Says that that fracture healed well. However after that continued to have problems with pain and subsequent evaluation revealed that there is another area with a calcium deposit and a crack. Says that she is scheduled for surgery in a couple of weeks.  Says that that is the only new medical updates since her last visit here. Otherwise she has been doing well with no other new additional health problems.  Cousin of the problem with her foot and ankle she has not been able to exercise.  She is taking both blood pressure medications as directed. She is on Cozaar 100 mg daily and HCTZ 25 mg daily.  She has been checking her blood pressure at home.Gets very good readings there.    At prior OV  she said  that they have a gym in their house. Some days she would do the elliptical for 2 or 3 miles. Other days she did kick boxing video and weights. She did  exercise 3-5 days per week.   As well she states that she has an adult handicapped daughter who lives with them.   Still sees Dr. Leda Quail routinely for GYN. She says that Dr. Hyacinth Meeker is managing her Vit D Deficiency.  (I told her that I know Dr. 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 aspirin 81 MG tablet Take 81 mg by mouth daily.    Marland Kitchen CALCIUM PO Take by mouth daily.    . Coenzyme Q10 (CO Q-10) 300 MG CAPS Take by mouth. Occasionally    . estradiol-norethindrone (MIMVEY) 1-0.5 MG per tablet Take 1 tablet by mouth daily. 30  tablet 12  . fluticasone (FLONASE) 50 MCG/ACT nasal spray Place 2 sprays into both nostrils daily. 16 g 6  . hydrochlorothiazide (HYDRODIURIL) 25 MG tablet take 1 tablet by mouth once daily 30 tablet 5  . KRILL OIL PO Take by mouth. Omega red krill oil    . losartan (COZAAR) 100 MG tablet take 1 tablet by mouth once daily 30 tablet 5  . Red Yeast Rice Extract (RED YEAST RICE PO) Take by mouth daily.    . vitamin C (ASCORBIC ACID) 500 MG tablet Take 500 mg by mouth 2 (two) times daily.    . Vitamin D, Ergocalciferol, (DRISDOL) 50000 UNITS CAPS capsule Take 1 capsule (50,000 Units total) by mouth every 7 (seven) days. 12 capsule 4  . amoxicillin-clavulanate (AUGMENTIN) 875-125 MG per tablet Take 1 tablet by mouth 2 (two) times daily. (Patient not taking: Reported on 12/06/2014) 20 tablet 0  . promethazine (PHENERGAN) 25 MG suppository Place 1 suppository (25 mg total) rectally every 6 (six) hours as needed for nausea or vomiting. (Patient not taking: Reported on 12/06/2014) 12 each 0   No facility-administered medications prior to visit.     Allergies:  Allergies  Allergen Reactions  . Betadine [Povidone Iodine] Itching  . Codeine   .  Other     Band aids-itchy, red rash      Review of Systems: See HPI for pertinent ROS. All other ROS negative.    Physical Exam: Blood pressure 126/72, pulse 78, temperature 98.3 F (36.8 C), temperature source Oral, resp. rate 12, height 5\' 6"  (1.676 m), weight 192 lb (87.091 kg), last menstrual period 04/27/1992., Body mass index is 31 kg/(m^2). General:  WNWD WF. Appears in no acute distress. Neck: Supple. No thyromegaly. No lymphadenopathy. No carotid bruits. Lungs: Clear bilaterally to auscultation without wheezes, rales, or rhonchi. Breathing is unlabored. Heart: Regular rhythm. No murmurs, rubs, or gallops. Msk:  Strength and tone normal for age. Extremities/Skin: Warm and dry. No edema.  Neuro: Alert and oriented X 3. Moves all extremities  spontaneously. Gait is normal. CNII-XII grossly in tact. Psych:  Responds to questions appropriately with a normal affect.     ASSESSMENT AND PLAN:  59 y.o. year old female with  1. Hypertension Blood pressure is at goal/controlled. Cont current meds. Check labs to monitor.  - BASIC METABOLIC PANEL WITH GFR - losartan (COZAAR) 100 MG tablet; Take 1 tablet (100 mg total) by mouth daily.  Dispense: 30 tablet; Refill: 5 - hydrochlorothiazide (HYDRODIURIL) 25 MG tablet; Take 1 tablet (25 mg total) by mouth daily.  Dispense: 30 tablet; Refill: 5  2. Screening mammogram, breast exam, pelvic exam--Per GYN-- Dr. Rubie Maid [facility] manages this. I will NOT recheck here. Screening Labs: I see in Epic that Dr. Hyacinth Meeker checked TSH, FLP 11/2013. TSH normal. FLP was good.  3. Screening Colonoscopy:  Patient reports she had one screening colonoscopy at age 37.--Dr. Buccini  Patient reports that she has had one repeat colonoscopy since then--09/28/2012--says this one was performed secondary to abdominal pain she was experiencing. Says that Dr. Marjorie Smolder told        her that he felt her abdominal pain was secondary to scar tissue secondary to all of her past abdominal surgeries.  4. Immunizations: Influenza: N/A--August Tetanus: She received a Tdap  booster here 01/29/2011 Pneumonia Vaccine: She has no indication to receive pneumonia vaccine until age 59 Zostavax: Will discuss this at age 61  Routine office visit in 6 months or sooner if needed.  Signed, [address] [place], [place], [facility] 12/06/2014 8:11 AM

## 2014-12-13 ENCOUNTER — Telehealth: Payer: Self-pay | Admitting: *Deleted

## 2014-12-13 NOTE — Telephone Encounter (Signed)
[name] [facility] states pt is scheduled for MRI of right ankle with and without contrast 12/17/2014, but was only prior authorized for the without portion.  [employer] [phone] contacted at 314pm and prior authorization for the MRI right ankle with contrast was prior approved: [id number] expires 01/18/2015. Called to [place].

## 2014-12-17 ENCOUNTER — Telehealth: Payer: Self-pay | Admitting: *Deleted

## 2014-12-17 ENCOUNTER — Other Ambulatory Visit: Payer: Self-pay | Admitting: *Deleted

## 2014-12-17 ENCOUNTER — Ambulatory Visit (HOSPITAL_COMMUNITY)
Admission: RE | Admit: 2014-12-17 | Discharge: 2014-12-17 | Disposition: A | Discharge status: Home / Self Care | Payer: 59 | Source: Ambulatory Visit | Attending: Podiatry | Admitting: Podiatry

## 2014-12-17 DIAGNOSIS — M722 Plantar fascial fibromatosis: Secondary | ICD-10-CM | POA: Diagnosis not present

## 2014-12-17 DIAGNOSIS — M659 Synovitis and tenosynovitis, unspecified: Secondary | ICD-10-CM | POA: Insufficient documentation

## 2014-12-17 DIAGNOSIS — S86311A Strain of muscle(s) and tendon(s) of peroneal muscle group at lower leg level, right leg, initial encounter: Secondary | ICD-10-CM | POA: Insufficient documentation

## 2014-12-17 DIAGNOSIS — M25571 Pain in right ankle and joints of right foot: Secondary | ICD-10-CM | POA: Diagnosis present

## 2014-12-17 DIAGNOSIS — W11XXXA Fall on and from ladder, initial encounter: Secondary | ICD-10-CM | POA: Diagnosis not present

## 2014-12-17 DIAGNOSIS — Y929 Unspecified place or not applicable: Secondary | ICD-10-CM | POA: Insufficient documentation

## 2014-12-17 LAB — POCT I-STAT CREATININE: Creatinine, Ser: 0.8 mg/dL (ref 0.44–1.00)

## 2014-12-17 MED ORDER — ESTRADIOL-NORETHINDRONE ACET 1-0.5 MG PO TABS: 1.0000 | ORAL_TABLET | Freq: Every day | ORAL | Status: DC

## 2014-12-17 MED ORDER — GADOBENATE DIMEGLUMINE 529 MG/ML IV SOLN
18.0000 mL | Freq: Once | INTRAVENOUS | Status: AC | PRN
  Administered 2014-12-17: 18 mL via INTRAVENOUS

## 2014-12-17 NOTE — Telephone Encounter (Signed)
[name] asked if the MRI without/with could be ordered without only, because it was not indicate for this diagnosis.  I reviewed pt's medical notes and Dr. Ardelle Anton was also r/o osteochondral lesion, so the without/with order remains.

## 2014-12-17 NOTE — Telephone Encounter (Addendum)
Medication Faxed refill request:  [place]  Last AEX:  12/21/13 with SM  Next AEX: 01/17/15 with SM  Last MMG (if hormonal medication request): 05/28/14 breast density category b; bi-rads 1: negative  Refill authorized: #30/0 rfs , please advise.   (routed to Dr. Oscar La since Dr. Hyacinth Meeker is in surgery)

## 2014-12-17 NOTE — Telephone Encounter (Signed)
Entered in error

## 2014-12-20 ENCOUNTER — Telehealth: Payer: Self-pay | Admitting: Obstetrics & Gynecology

## 2014-12-20 NOTE — Telephone Encounter (Signed)
Medication refill request: HRT Last AEX:  12/21/13 SM Next AEX: 01/17/15 SM Last MMG (if hormonal medication request): 05/28/14 [id number]:neg Refill authorized: 12/17/14 #30/0R to [facility] [address], [facility] pharmacy spoke with Marchelle Folks. They have Rx on file from 12/17/14.  Called patient and notified.

## 2014-12-20 NOTE — Telephone Encounter (Signed)
Patient is asking for refill status on HRT medication. Patient says she has been out of her medication for 5 days and really needs this refill. Patient says the pharmacy told her they are not getting any response from our office. Patient is asking for a call back please. Confirmed pharmacy with patient.

## 2014-12-21 ENCOUNTER — Encounter: Payer: Self-pay | Admitting: Podiatry

## 2014-12-21 ENCOUNTER — Ambulatory Visit (INDEPENDENT_AMBULATORY_CARE_PROVIDER_SITE_OTHER): Payer: Commercial Managed Care - HMO | Admitting: Podiatry

## 2014-12-21 VITALS — BP 125/84 | HR 92 | Resp 18

## 2014-12-21 DIAGNOSIS — T148XXA Other injury of unspecified body region, initial encounter: Secondary | ICD-10-CM

## 2014-12-21 DIAGNOSIS — S96911S Strain of unspecified muscle and tendon at ankle and foot level, right foot, sequela: Secondary | ICD-10-CM | POA: Diagnosis not present

## 2014-12-21 DIAGNOSIS — T148 Other injury of unspecified body region: Secondary | ICD-10-CM | POA: Diagnosis not present

## 2014-12-21 DIAGNOSIS — M25371 Other instability, right ankle: Secondary | ICD-10-CM

## 2014-12-21 NOTE — Patient Instructions (Addendum)
Pre-Operative Instructions  Congratulations, you have decided to take an important step to improving your quality of life.  You can be assured that the doctors of [facility] will be with you every step of the way.  1. Plan to be at the surgery center/hospital at least 1 (one) hour prior to your scheduled time unless otherwise directed by the surgical center/hospital staff.  You must have a responsible adult accompany you, remain during the surgery and drive you home.  Make sure you have directions to the surgical center/hospital and know how to get there on time. 2. For hospital based surgery you will need to obtain a history and physical form from your family physician within 1 month prior to the date of surgery- we will give you a form for you primary physician.  3. We make every effort to accommodate the date you request for surgery.  There are however, times where surgery dates or times have to be moved.  We will contact you as soon as possible if a change in schedule is required.   4. No Aspirin/Ibuprofen for one week before surgery.  If you are on aspirin, any non-steroidal anti-inflammatory medications (Mobic, Aleve, Ibuprofen) you should stop taking it 7 days prior to your surgery.  You make take Tylenol  For pain prior to surgery.  5. Medications- If you are taking daily heart and blood pressure medications, seizure, reflux, allergy, asthma, anxiety, pain or diabetes medications, make sure the surgery center/hospital is aware before the day of surgery so they may notify you which medications to take or avoid the day of surgery. 6. No food or drink after midnight the night before surgery unless directed otherwise by surgical center/hospital staff. 7. No alcoholic beverages 24 hours prior to surgery.  No smoking 24 hours prior to or 24 hours after surgery. 8. Wear loose pants or shorts- loose enough to fit over bandages, boots, and casts. 9. No slip on shoes, sneakers are best. 10. Bring  your boot with you to the surgery center/hospital.  Also bring crutches or a walker if your physician has prescribed it for you.  If you do not have this equipment, it will be provided for you after surgery. 11. If you have not been contracted by the surgery center/hospital by the day before your surgery, call to confirm the date and time of your surgery. 12. Leave-time from work may vary depending on the type of surgery you have.  Appropriate arrangements should be made prior to surgery with your employer. 13. Prescriptions will be provided immediately following surgery by your doctor.  Have these filled as soon as possible after surgery and take the medication as directed. 14. Remove nail polish on the operative foot. 15. Wash the night before surgery.  The night before surgery wash the foot and leg well with the antibacterial soap provided and water paying special attention to beneath the toenails and in between the toes.  Rinse thoroughly with water and dry well with a towel.  Perform this wash unless told not to do so by your physician.  Enclosed: 1 Ice pack (please put in freezer the night before surgery)   1 Hibiclens skin cleaner   Pre-op Instructions  If you have any questions regarding the instructions, do not hesitate to call our office.  [place]: [address] [place], [place] [address] [phone]  [place]: [address] [place], [place] [address] [phone]  [place]: [address]  [place], [place] [address] [phone]  Dr. Richard   Tuchman DPM, Dr. Norman Regal DPM Dr. Richard Sikora DPM, Dr. M. Todd Hyatt DPM, Dr. Darlisa Egerton DPM, Dr. Bluma Buresh DPM 

## 2014-12-28 NOTE — Progress Notes (Signed)
Patient ID: [name], female   DOB: 09-Jul-1955, 59 y.o.   MRN: [id number]  Subjective: 59 year old female presents the office discussed MRI results. She states that she continues have significant pain over the outside portion of the right ankle. She's been trying to wear her brace which helped some but not complete. She's also had multiple steroid injections, taking anti-inflammatories, shoe changes, rehabilitation exercises without any relief of symptoms. She states the swelling is intermittent without any associated redness or increased warmth. No other complaints at this time in no acute changes since last appointment.  Objective: AAO 3, NAD DP/PT pulses palpable, CRT less than 3 seconds Protective sensation intact with Semmes-Weinstein monofilament There is tenderness palpation upon lateral aspect of the right ankle. There is tenderness on the course of the ATFL and CFL. There is no pain on the course of the PTFL. He does appear to be slight increase in anterior drawer compared to the contralateral extremity. There is pain when preforming anterior drawer test. There is pain along the course of the peroneal tendons just posterior and inferior to lateral malleolus. There is pain with eversion. There is mild diffuse tenderness along the midfoot. There is no overlying edema, erythema, increased warmth. There is no specific area pinpoint bony tenderness or pain the vibratory sensation. MMT 5/5, ROM WNL. No open lesions or pre-ulcerative lesions. No pain with calf compression, swelling, warmth, erythema.  Assessment: 59 year old female with lateral ankle instability, peroneal tendon tear  Plan: -MRI results were discussed the patient in detail. I discussed further multiple findings on the MRI. I discussed surgical intervention with her however we cannot perform surgery to fix always issues at 1 time. Most of her symptoms in the localized lateral aspect of the ankle and the course of lateral ankle  complex in the peroneal tendon. Because of the discussed thoroughly lateral ankle stabilization and peroneal tendon repair. She will proceed with surgery. She is aware that surgery may not fix all of her issues and she may continue to have pain.She's had substantial pain despite conservative treatment therefore I do think that surgery is appropriate. -The incision placement as well as the postoperative course was discussed with the patient. I discussed risks of the surgery which include, but not limited to, infection, bleeding, pain, swelling, need for further surgery, delayed or nonhealing, painful or ugly scar, numbness or sensation changes, over/under correction, recurrence, transfer lesions, further deformity, hardware failure, DVT/PE, loss of toe/foot. Patient understands these risks and wishes to proceed with surgery. The surgical consent was reviewed with the patient all 3 pages were signed. No promises or guarantees were given to the outcome of the procedure. All questions were answered to the best of my ability. Before the surgery the patient was encouraged to call the office if there is any further questions. The surgery will be performed at the [facility] on an outpatient basis.  Ovid Curd, DPM

## 2015-01-14 ENCOUNTER — Telehealth: Payer: Self-pay | Admitting: *Deleted

## 2015-01-14 NOTE — Telephone Encounter (Signed)
Authorization for outpatient surgery scheduled for 01/30/2015 for Repair Tendon right was obtained.  Authorization number is [id number].  I faxed the information to [facility]

## 2015-01-17 ENCOUNTER — Ambulatory Visit (INDEPENDENT_AMBULATORY_CARE_PROVIDER_SITE_OTHER): Payer: Commercial Managed Care - HMO | Admitting: Obstetrics & Gynecology

## 2015-01-17 ENCOUNTER — Telehealth: Payer: Self-pay | Admitting: Obstetrics & Gynecology

## 2015-01-17 ENCOUNTER — Other Ambulatory Visit: Payer: Self-pay | Admitting: Obstetrics & Gynecology

## 2015-01-17 ENCOUNTER — Encounter: Payer: Self-pay | Admitting: Obstetrics & Gynecology

## 2015-01-17 VITALS — BP 136/82 | HR 64 | Resp 16 | Ht 65.5 in | Wt 189.0 lb

## 2015-01-17 DIAGNOSIS — R7309 Other abnormal glucose: Secondary | ICD-10-CM

## 2015-01-17 DIAGNOSIS — I10 Essential (primary) hypertension: Secondary | ICD-10-CM | POA: Diagnosis not present

## 2015-01-17 DIAGNOSIS — Z0000 Encounter for general adult medical examination without abnormal findings: Secondary | ICD-10-CM

## 2015-01-17 DIAGNOSIS — Z01419 Encounter for gynecological examination (general) (routine) without abnormal findings: Secondary | ICD-10-CM | POA: Diagnosis not present

## 2015-01-17 DIAGNOSIS — E559 Vitamin D deficiency, unspecified: Secondary | ICD-10-CM | POA: Diagnosis not present

## 2015-01-17 HISTORY — DX: Other abnormal glucose: R73.09

## 2015-01-17 LAB — POCT URINALYSIS DIPSTICK
Bilirubin, UA: NEGATIVE
Blood, UA: NEGATIVE
GLUCOSE UA: NEGATIVE
Ketones, UA: NEGATIVE
Leukocytes, UA: NEGATIVE
Nitrite, UA: NEGATIVE
Protein, UA: NEGATIVE
Urobilinogen, UA: NEGATIVE
pH, UA: 5

## 2015-01-17 LAB — COMPREHENSIVE METABOLIC PANEL
ALBUMIN: 4.3 g/dL (ref 3.6–5.1)
ALK PHOS: 34 U/L (ref 33–130)
ALT: 10 U/L (ref 6–29)
AST: 16 U/L (ref 10–35)
BILIRUBIN TOTAL: 0.4 mg/dL (ref 0.2–1.2)
BUN: 13 mg/dL (ref 7–25)
CALCIUM: 9.3 mg/dL (ref 8.6–10.4)
CO2: 27 mmol/L (ref 20–31)
CREATININE: 0.6 mg/dL (ref 0.50–1.05)
Chloride: 101 mmol/L (ref 98–110)
Glucose, Bld: 100 mg/dL — ABNORMAL HIGH (ref 65–99)
Potassium: 3.5 mmol/L (ref 3.5–5.3)
SODIUM: 142 mmol/L (ref 135–146)
TOTAL PROTEIN: 6.5 g/dL (ref 6.1–8.1)

## 2015-01-17 LAB — TSH: TSH: 0.471 u[IU]/mL (ref 0.350–4.500)

## 2015-01-17 LAB — CBC
HEMATOCRIT: 42.3 % (ref 36.0–46.0)
Hemoglobin: 13.7 g/dL (ref 12.0–15.0)
MCH: 27.1 pg (ref 26.0–34.0)
MCHC: 32.4 g/dL (ref 30.0–36.0)
MCV: 83.8 fL (ref 78.0–100.0)
MPV: 10.9 fL (ref 8.6–12.4)
PLATELETS: 258 10*3/uL (ref 150–400)
RBC: 5.05 MIL/uL (ref 3.87–5.11)
RDW: 14 % (ref 11.5–15.5)
WBC: 6.7 10*3/uL (ref 4.0–10.5)

## 2015-01-17 LAB — LIPID PANEL
CHOLESTEROL: 214 mg/dL — AB (ref 125–200)
HDL: 52 mg/dL (ref >=46)
LDL CALC: 136 mg/dL — AB (ref <130)
TRIGLYCERIDES: 130 mg/dL (ref <150)
Total CHOL/HDL Ratio: 4.1 Ratio (ref <=5.0)
VLDL: 26 mg/dL (ref <30)

## 2015-01-17 MED ORDER — MEDROXYPROGESTERONE ACETATE 2.5 MG PO TABS: 2.5000 mg | ORAL_TABLET | Freq: Every day | ORAL | Status: DC

## 2015-01-17 MED ORDER — VITAMIN D (ERGOCALCIFEROL) 1.25 MG (50000 UNIT) PO CAPS: 50000.0000 [IU] | ORAL_CAPSULE | ORAL | Status: DC

## 2015-01-17 MED ORDER — ESTRADIOL 0.1 MG/24HR TD PTTW: 1.0000 | MEDICATED_PATCH | TRANSDERMAL | Status: DC

## 2015-01-17 NOTE — Telephone Encounter (Signed)
Spoke with patient. Patient states that she went to pick up her Estradiol 0.1 mg patches from the pharmacy and was told that the generic is not covered with her insurance and she will need a PA. States the brand name is and will be $40. Patient would like to pay for brand name this month as she is out and have PA filled out. Advised I will submit PA today for Estradiol 0.1 mg patches which can take 48-72 hours. Advised once I have received notification from her insurance about approval or denial I will return call. Patient is agreeable.  PA for Estradiol 0.1 mg patches sent via cover my meds.

## 2015-01-17 NOTE — Telephone Encounter (Signed)
Patient was seen earlier today and went to her pharmacy to pick up her hormone patches. Patient was told her insurance would not cover this prescription. Please call asap.

## 2015-01-17 NOTE — Progress Notes (Signed)
59 y.o. G2P2 MarriedCaucasianF here for annual exam.  Denies vaginal bleeding.  Pt reports she doesn't feel quite as good with the oral dosing as she did with the patch.  Would like to switch back.  D/W pt options.   Having foot surgery/repair after having broken ankle in January.  Having surgery in early October.  Pt is going to have to have a staged surgery due to extent of damage.  Bone has healed but just a lot of tissue damage.  Will be on Lovenox 4-6 weeks.    PCP:  Dr. Durwin Nora.  Had appt in August.  Needs blood work today.  Patient's last menstrual period was 04/27/1992.          Sexually active: Yes.    The current method of family planning is status post hysterectomy.    Exercising: Yes.    normally 2-3 times/week until ankle injury this year Smoker:  no  Health Maintenance: Pap:  10/22/10 WNL History of abnormal Pap:  no MMG:  05/28/14 3D-BiRads 1-negative Colonoscopy:  2014-repeat in 10 years, Dr. Matthias Hughs BMD:   none TDaP:  2012  Screening Labs: today, Hb today: today, Urine today: neg   reports that she has never smoked. She has never used smokeless tobacco. She reports that she drinks alcohol. She reports that she does not use illicit drugs.  Past Medical History  Diagnosis Date  . Varicose vein     surgery on right leg  . Migraine     h/o migraines, none since hysterectomy  . Endometriosis   . Hypertension   . Herpes simplex type 1 infection   . Broken ankle 1/16    will have surgery on 01/30/15-right ankle    Current Outpatient Prescriptions  Medication Sig Dispense Refill  . aspirin 81 MG tablet Take 81 mg by mouth daily.    Marland Kitchen CALCIUM PO Take by mouth daily.    Marland Kitchen estradiol-norethindrone (MIMVEY) 1-0.5 MG per tablet Take 1 tablet by mouth daily. 30 tablet 0  . fluticasone (FLONASE) 50 MCG/ACT nasal spray Place 2 sprays into both nostrils daily. 16 g 6  . hydrochlorothiazide (HYDRODIURIL) 25 MG tablet take 1 tablet by mouth once daily 30 tablet 5  . KRILL OIL PO  Take by mouth. Omega red krill oil    . losartan (COZAAR) 100 MG tablet take 1 tablet by mouth once daily 30 tablet 5  . Red Yeast Rice Extract (RED YEAST RICE PO) Take by mouth daily.    . vitamin C (ASCORBIC ACID) 500 MG tablet Take 500 mg by mouth 2 (two) times daily.    . Vitamin D, Ergocalciferol, (DRISDOL) 50000 UNITS CAPS capsule Take 1 capsule (50,000 Units total) by mouth every 7 (seven) days. 12 capsule 4  . clobetasol ointment (TEMOVATE) 0.05 % Apply 1 application topically as needed.     No current facility-administered medications for this visit.    Family History  Problem Relation Age of Onset  . Skin cancer    . Lung cancer Father   . Spina bifida Daughter     and hydrocephalia  . Diabetes Mother   . Diabetes Sister     x 3  . Hypertension Mother   . Hypertension Sister   . Thyroid nodules Mother     being watched  . Thyroid nodules Sister     ROS:  Pertinent items are noted in HPI.  Otherwise, a comprehensive ROS was negative.  Exam:   General appearance: alert, cooperative and  appears stated age Head: Normocephalic, without obvious abnormality, atraumatic Neck: no adenopathy, supple, symmetrical, trachea midline and thyroid normal to inspection and palpation Lungs: clear to auscultation bilaterally Breasts: normal appearance, no masses or tenderness Heart: regular rate and rhythm Abdomen: soft, non-tender; bowel sounds normal; no masses,  no organomegaly Extremities: extremities normal, atraumatic, no cyanosis or edema Skin: Skin color, texture, turgor normal. No rashes or lesions Lymph nodes: Cervical, supraclavicular, and axillary nodes normal. No abnormal inguinal nodes palpated Neurologic: Grossly normal   Pelvic: External genitalia:  no lesions              Urethra:  normal appearing urethra with no masses, tenderness or lesions              Bartholins and Skenes: normal                 Vagina: normal appearing vagina with normal color and  discharge, no lesions              Cervix: absent              Pap taken: No. Bimanual Exam:  Uterus:  uterus absent              Adnexa: no mass, fullness, tenderness               Rectovaginal: Confirms               Anus:  normal sphincter tone, no lesions  Chaperone was present for exam.  A:  Well Woman with normal exam  PMP, on HRT Upcoming foot surgery H/O LAVH/RSO, then later LSO due to endometriosis  Hypertension  Elevated lipids  Low Vit D  H/O HSV 1  P: Mammogram yearly. Patient doing 3D. No Pap. H/O LAVH Labs obtained today:  CBC, CMP, Lipids, TSH, and VIt D rx for Vit D 50K weekly to pharmacy Will change HRT back to Vivelle dot 0.1mg  patch twice weekly and provera 2.77m daily.  Rx to pharmacy.  Pt will be on Lovenox with foot surgery and I am supportive of this. return annually or prn

## 2015-01-18 LAB — VITAMIN D 25 HYDROXY (VIT D DEFICIENCY, FRACTURES): Vit D, 25-Hydroxy: 31 ng/mL (ref 30–100)

## 2015-01-21 ENCOUNTER — Encounter: Payer: Self-pay | Admitting: Obstetrics and Gynecology

## 2015-01-21 LAB — HEMOGLOBIN A1C
HEMOGLOBIN A1C: 6 % — AB (ref <5.7)
Mean Plasma Glucose: 126 mg/dL — ABNORMAL HIGH (ref <117)

## 2015-01-22 ENCOUNTER — Telehealth: Payer: Self-pay

## 2015-01-22 NOTE — Telephone Encounter (Signed)
-----   Message from Patton Salles, MD sent at 01/21/2015  9:42 PM EDT ----- This is Dr. Edward Jolly reviewing Dr. Rondel Baton inbox in her absence. Please report hemoglobin A1C to patient, 6.0, which is in a prediabetic range.  She does not have overt diabetes. She can benefit from following a low sugar and low carbohydrate diet.   I would recommend follow up with her primary care provider.  Cc- Lorrene Reid

## 2015-01-22 NOTE — Telephone Encounter (Signed)
Spoke with patient. Advised of message as seen below from Dr.Silva. Patient is agreeable and verbalizes understanding. Will follow up with her PCP regarding hemoglobin A1c.  Routing to provider for final review. Patient agreeable to disposition. Will close encounter.

## 2015-01-25 ENCOUNTER — Ambulatory Visit: Payer: 59 | Admitting: Obstetrics & Gynecology

## 2015-01-25 ENCOUNTER — Telehealth: Payer: Self-pay | Admitting: *Deleted

## 2015-01-25 NOTE — Telephone Encounter (Signed)
Pt asked if Dr. Ardelle Anton was going to give her a blood thinner prior to 01/30/2015 surgery.

## 2015-01-28 MED ORDER — ENOXAPARIN SODIUM 40 MG/0.4ML ~~LOC~~ SOLN: 40.0000 mg | SUBCUTANEOUS | Status: DC

## 2015-01-28 NOTE — Telephone Encounter (Signed)
Dr. Ardelle Anton wanted me to let you know that your prescription for Lovenox was sent to your [facility] in [place].  He said to bring it with you to surgery and he will show you how to use it.  "Okay, bring it with me to surgery.  That's all I needed to know."

## 2015-01-30 ENCOUNTER — Other Ambulatory Visit: Payer: Self-pay | Admitting: Podiatry

## 2015-01-30 DIAGNOSIS — T148 Other injury of unspecified body region: Secondary | ICD-10-CM | POA: Diagnosis not present

## 2015-01-30 DIAGNOSIS — T148XXA Other injury of unspecified body region, initial encounter: Secondary | ICD-10-CM

## 2015-01-30 DIAGNOSIS — I748 Embolism and thrombosis of other arteries: Secondary | ICD-10-CM | POA: Diagnosis not present

## 2015-01-30 DIAGNOSIS — Z9889 Other specified postprocedural states: Secondary | ICD-10-CM

## 2015-01-30 DIAGNOSIS — M722 Plantar fascial fibromatosis: Secondary | ICD-10-CM | POA: Diagnosis not present

## 2015-01-30 HISTORY — PX: ANKLE SURGERY: SHX546

## 2015-02-04 ENCOUNTER — Encounter: Payer: Self-pay | Admitting: Podiatry

## 2015-02-04 ENCOUNTER — Ambulatory Visit (INDEPENDENT_AMBULATORY_CARE_PROVIDER_SITE_OTHER): Payer: Commercial Managed Care - HMO

## 2015-02-04 ENCOUNTER — Ambulatory Visit (INDEPENDENT_AMBULATORY_CARE_PROVIDER_SITE_OTHER): Payer: Commercial Managed Care - HMO | Admitting: Podiatry

## 2015-02-04 VITALS — BP 139/90 | HR 80 | Resp 17

## 2015-02-04 DIAGNOSIS — M25371 Other instability, right ankle: Secondary | ICD-10-CM | POA: Diagnosis not present

## 2015-02-04 DIAGNOSIS — T148XXA Other injury of unspecified body region, initial encounter: Secondary | ICD-10-CM

## 2015-02-04 DIAGNOSIS — Z9889 Other specified postprocedural states: Secondary | ICD-10-CM

## 2015-02-04 DIAGNOSIS — T148 Other injury of unspecified body region: Secondary | ICD-10-CM

## 2015-02-04 DIAGNOSIS — S96911S Strain of unspecified muscle and tendon at ankle and foot level, right foot, sequela: Secondary | ICD-10-CM

## 2015-02-04 NOTE — Progress Notes (Signed)
Patient ID: ZI SEK, female   DOB: 1955/12/28, 59 y.o.   MRN: [id number]  DOS: 01/30/15 s/p Right peroneal tendon repair and ATFL repair  Subjective: 59 year old female percent the office today one-week status post right ankle surgery. She states that she is doing well. She has continued pain medication although she has decreasing amounts she's been taking. She is continue the antibiotics. She's remain nonweightbearing. She denies any systemic complaints such as fevers, chills, nausea, vomiting. No calf pain, chest pain, shortness of breath. No other complaints at this time. No acute changes otherwise.  Objective: AAO 3, NAD Neurovascular status intact and unchanged Incisional lateral aspect of the right ankles woke left with staples intact. There is mild overlying edema without any associated erythema. There is no surrounding erythema, ascending cellulitis, fluctuance, crepitus, malodor, drainage/purulence. Mild tenderness to palpation of the surgical site. No other areas of tenderness to bilateral lower extremity's. No open lesions or pre-ulcer lesions identified bilaterally. There is no pain with calf compression, swelling, warmth, erythema.  Assessment: 59 year old female 1 week status post right ankle surgery on the doing well  Plan: -X-rays were obtained and reviewed with the patient.  -Treatment options discussed including all alternatives, risks, and complications -Antibiotic ointment was placed over the incision followed by dry sterile dressing. A well-padded below-knee fiberglass cast was also applied making sure to pad all bony prominences. -Continue nonweightbearing. -Lovenox -Continue pain medication -Monitor for any clinical signs or symptoms of infection and DVT/PEand directed to call the office immediately should any occur or go to the ER. -Follow-up in 1 week for cast change and likely staple removal or sooner if any problems arise. In the meantime, encouraged to call the  office with any questions, concerns, change in symptoms.    Ovid Curd, DPM

## 2015-02-05 ENCOUNTER — Other Ambulatory Visit: Payer: Self-pay | Admitting: Physician Assistant

## 2015-02-05 NOTE — Telephone Encounter (Signed)
Medication refilled per protocol. 

## 2015-02-14 NOTE — Telephone Encounter (Signed)
Spoke with patient. Advised PA was submitted for her Estradiol 0.1 mg patches. Received a letter from [facility] stating that the PA can not be performed as the product is a plan exclusion under her plan. According to the letter there is no coverage criteria to review and apply due to it being excluded. For additional questions [facility] recommends that the patient contact her insurance company to speak with Member Services. The number is on the back of the insurance card. Patient is agreeable and verbalizes understanding. She would like to continue to pay OOP cost for her Estradiol 0.1 mg patches at this time. Will notify the office is she needs any further assistance. Letter from [facility] to Dr.[name] for review and signature before scan.  Routing to provider for final review. Patient agreeable to disposition. Will close encounter.

## 2015-02-15 ENCOUNTER — Encounter: Payer: Self-pay | Admitting: Podiatry

## 2015-02-15 ENCOUNTER — Ambulatory Visit (INDEPENDENT_AMBULATORY_CARE_PROVIDER_SITE_OTHER): Payer: Commercial Managed Care - HMO | Admitting: Podiatry

## 2015-02-15 VITALS — BP 124/86 | HR 69 | Resp 12

## 2015-02-15 DIAGNOSIS — S96911S Strain of unspecified muscle and tendon at ankle and foot level, right foot, sequela: Secondary | ICD-10-CM

## 2015-02-15 DIAGNOSIS — M25371 Other instability, right ankle: Secondary | ICD-10-CM

## 2015-02-15 DIAGNOSIS — T148XXA Other injury of unspecified body region, initial encounter: Secondary | ICD-10-CM

## 2015-02-15 DIAGNOSIS — T148 Other injury of unspecified body region: Secondary | ICD-10-CM

## 2015-02-15 DIAGNOSIS — Z9889 Other specified postprocedural states: Secondary | ICD-10-CM

## 2015-02-19 NOTE — Progress Notes (Signed)
Patient ID: Marica OtterKathryn C Poffenberger, female   DOB: 16-Nov-1955, 59 y.o.   MRN: [id number]  DOS: 01/30/15 s/p Right peroneal tendon repair and ATFL repair  Subjective: 59 year old female percent the office today status post right ankle surgery.  She says that overall she is doing well and her pain is controlled. She is not taking pain medication as regularly. She has also continue the Lovenox. She is remaining nonweightbearing. She denies any systemic complaints as fevers, chills, nausea, vomiting. Denies any calf pain, chest pain, shortness of breath. No other complaints at this time in no acute changes.  Objective: AAO 3, NAD  cast is clean, dry, intact. Upon removal DP/PT pulses 2/4, CRT less than 3 seconds. Protective sensation appears to be intact with Dorann OuSimms Weinstein monofilament. Incisional lateral aspect of the right ankles well coapted without any evidence of dehiscence and staples are intact. There is mild edema around the area without any surrounding erythema, ascending cellulitis, fluctuance, crepitus, malodor, drainage/purulence. There is no tenderness to palpation on the surgical site at this time. No other areas of tenderness bilateral lower extremity is. No other open lesions or pre-ulcerative lesions. There is no pain with calf compression, swelling, warmth, erythema.  Assessment: 59 year old female status post right ankle surgery on the doing well  Plan: -Treatment options discussed including all alternatives, risks, and complications - The cast was removed. The staples are then removed without any complications. Antibiotic ointment was placed over the incision followed by dry sterile dressing. A well-padded below-knee fiberglass cast was also applied making sure to pad all bony prominences. -Continue nonweightbearing. -Lovenox -Continue pain medication -Monitor for any clinical signs or symptoms of infection and DVT/PEand directed to call the office immediately should any occur or go to the  ER. -Follow-up in 2 weeks for cast change or sooner if any problems arise. In the meantime, encouraged to call the office with any questions, concerns, change in symptoms.   Ovid CurdMatthew Wagoner, DPM

## 2015-02-27 ENCOUNTER — Telehealth: Payer: Self-pay | Admitting: *Deleted

## 2015-02-27 MED ORDER — ENOXAPARIN SODIUM 40 MG/0.4ML ~~LOC~~ SOLN: 40.0000 mg | SUBCUTANEOUS | Status: DC

## 2015-02-27 NOTE — Telephone Encounter (Addendum)
Pt request refill of Lovenox for 2 weeks.  Informed pt that Dr. Ardelle AntonWagoner refilled for 14 days.

## 2015-02-27 NOTE — Telephone Encounter (Signed)
OK 

## 2015-03-01 ENCOUNTER — Encounter: Payer: Self-pay | Admitting: Podiatry

## 2015-03-01 ENCOUNTER — Ambulatory Visit (INDEPENDENT_AMBULATORY_CARE_PROVIDER_SITE_OTHER): Payer: Commercial Managed Care - HMO | Admitting: Podiatry

## 2015-03-01 VITALS — BP 134/77 | HR 76 | Resp 12

## 2015-03-01 DIAGNOSIS — T148 Other injury of unspecified body region: Secondary | ICD-10-CM

## 2015-03-01 DIAGNOSIS — M25371 Other instability, right ankle: Secondary | ICD-10-CM

## 2015-03-01 DIAGNOSIS — Z9889 Other specified postprocedural states: Secondary | ICD-10-CM

## 2015-03-01 DIAGNOSIS — S96911S Strain of unspecified muscle and tendon at ankle and foot level, right foot, sequela: Secondary | ICD-10-CM

## 2015-03-01 DIAGNOSIS — T148XXA Other injury of unspecified body region, initial encounter: Secondary | ICD-10-CM

## 2015-03-07 DIAGNOSIS — S96919A Strain of unspecified muscle and tendon at ankle and foot level, unspecified foot, initial encounter: Secondary | ICD-10-CM | POA: Insufficient documentation

## 2015-03-07 DIAGNOSIS — M25373 Other instability, unspecified ankle: Secondary | ICD-10-CM | POA: Insufficient documentation

## 2015-03-07 NOTE — Progress Notes (Signed)
Patient ID: [name], female   DOB: 1955-06-16, 59 y.o.   MRN: [id number]  DOS: 01/30/15 s/p Right peroneal tendon repair and ATFL repair  Subjective: 59 year old female percent the office today status post right ankle surgery.  She continues to state that she is doing well on her pain is controlled. Today she was certainly have some pain to the arch of her foot although she denies any recent falls or injury. She does feel the cast is loose as her swelling has improved. She has been continuing with Lovenox. She is remaining nonweightbearing. She denies any systemic complaints as fevers, chills, nausea, vomiting. Denies any calf pain, chest pain, shortness of breath. No other complaints at this time in no acute changes.  Objective: AAO 3, NAD Cast is clean, dry, intact.  DP/PT pulses 2/4, CRT less than 3 seconds.  Protective sensation appears to be intact with Dorann OuSimms Weinstein monofilament.  Incisions on the lateral aspect of the right ankle is well coapted without any evidence of dehiscence and a scar has formed. There is mild edema around the area without any surrounding erythema, ascending cellulitis, fluctuance, crepitus, malodor, drainage/purulence. There is no tenderness to palpation on the surgical site at this time. There is mild discomfort along the medial arch of the left foot without any associated erythema or increase in warmth. No other areas of tenderness bilateral lower extremity is. No other open lesions or pre-ulcerative lesions. There is no pain with calf compression, swelling, warmth, erythema.  Assessment: 59 year old female status post right ankle surgery on the doing well; with mild arch pain  Plan: -Treatment options discussed including all alternatives, risks, and complications -The cast was removed and the patient was evaluated. Antibiotic ointment was placed over the incision followed by dry sterile dressing. A well-padded below-knee fiberglass cast was also applied  making sure to pad all bony prominences. -Continue nonweightbearing. -Ice/elevation -Lovenox -Continue pain medication prn -Monitor for any clinical signs or symptoms of infection and DVT/PEand directed to call the office immediately should any occur or go to the ER. -Follow-up in 2 weeks or sooner should any problems arise. In the meantime, encouraged to call the office with any questions, concerns, change in symptoms.  *Will likely transition to CAM boot next appointment and start ROM exercises and possibly WBAT/PT  Ovid CurdMatthew Yazlyn Wentzel, DPM

## 2015-03-12 ENCOUNTER — Telehealth: Payer: Self-pay | Admitting: Obstetrics & Gynecology

## 2015-03-12 NOTE — Telephone Encounter (Signed)
Spoke with Bonita QuinLinda at the [facility] pharmacy on file. Bonita QuinLinda states that the patient's rx for Estradiol 0.1 mg patch place one patch twice a week has been ordered and scheduled for delivery to the pharmacy today. Spoke with patient advised of message from CathayLinda. Patient is agreeable. Will notify our office if for any reason the rx does not come in or needs an alternative.  Routing to provider for final review. Patient agreeable to disposition. Will close encounter.

## 2015-03-12 NOTE — Telephone Encounter (Signed)
Patient says her pharmacy does not have the minivelle patches and can't find anywhere. Is there anything else she can get to replace that?

## 2015-03-14 ENCOUNTER — Telehealth: Payer: Self-pay | Admitting: Obstetrics & Gynecology

## 2015-03-14 NOTE — Telephone Encounter (Signed)
Patient called and said, "My pharmacy is not able to get Minivelle in stock due to the manufacturer not responding to their requests. I need some alternatives on what to do." Pharmacy on file is correct.

## 2015-03-14 NOTE — Telephone Encounter (Signed)
Patient is currently using Estradiol 0.1 mg patches place one patch onto the skin twice a week. Patient states that her pharmacy is unable to get the brand or the generic of this medication. Patient is requesting an alternative medication. Patient was previously on oral estrogen and reported at [facility] on 01/17/2015 that she did not feel as well on it. Routing to Dr.[name] for review and advise.

## 2015-03-14 NOTE — Telephone Encounter (Signed)
Spoke with patient. Advised of message as seen below from the patient. Patient states that the current box of patches she has is for Minivelle. Advised rx was sent for Vivelle Dot. Will need to verify with pharmacy which they are trying to fill as they may be able to get the alternative. Patient is agreeable and verbalizes understanding.  Spoke with a representative at [facility] who states they have been filling Vivelle dot for the patient. Are unable to get Minivelle and Vivelle dot in stock. Do have generic Estradiol 0.1 mg patches as the generic alternative at the pharmacy for 48.42 per month.  Spoke with the patient who states she would like to have the generic Estradiol 0.1 mg patches filled. Will pay OOP for these for this month. Aware I will contact the pharmacy to let them know to have this filled.  [facility]. Spoke with Desiree advised patient would like to have Estradiol 0.1 mg patches filled and will pay OOP cost. Desiree is agreeable and will fill this rx.  Routing to provider for final review. Patient agreeable to disposition. Will close encounter.

## 2015-03-14 NOTE — Telephone Encounter (Signed)
Ok to switch to Atmos EnergyMinivelle 0.1mg  patch twice weekly.  She will need to use the savings coupon that we have or she can get it on-line.  Ok to place rx to pharmacy.

## 2015-03-15 ENCOUNTER — Encounter: Payer: Self-pay | Admitting: Podiatry

## 2015-03-15 ENCOUNTER — Ambulatory Visit (INDEPENDENT_AMBULATORY_CARE_PROVIDER_SITE_OTHER): Payer: Commercial Managed Care - HMO | Admitting: Podiatry

## 2015-03-15 VITALS — BP 158/95 | HR 81 | Resp 18

## 2015-03-15 DIAGNOSIS — M25371 Other instability, right ankle: Secondary | ICD-10-CM | POA: Diagnosis not present

## 2015-03-15 DIAGNOSIS — Z9889 Other specified postprocedural states: Secondary | ICD-10-CM | POA: Diagnosis not present

## 2015-03-15 DIAGNOSIS — T148 Other injury of unspecified body region: Secondary | ICD-10-CM

## 2015-03-15 DIAGNOSIS — S96911S Strain of unspecified muscle and tendon at ankle and foot level, right foot, sequela: Secondary | ICD-10-CM

## 2015-03-15 DIAGNOSIS — T148XXA Other injury of unspecified body region, initial encounter: Secondary | ICD-10-CM

## 2015-03-15 MED ORDER — MEPERIDINE HCL 50 MG PO TABS: 50.0000 mg | ORAL_TABLET | Freq: Four times a day (QID) | ORAL | Status: DC | PRN

## 2015-03-16 NOTE — Progress Notes (Signed)
Patient ID: [name], female   DOB: 26-Feb-1956, 59 y.o.   MRN: [id number]  Subjective: [name] is a 59 y.o. is seen today in office s/p R peroneal tendon repair and ATFL repair preformed on 01/30/15. She states that she is having no pain and she's been doing well. She has remained nonweightbearing with the use of a rolling walker. She is not taking pain medication at this time. She denies any systemic complaints such as fevers, chills, nausea, vomiting. No calf pain, chest pain, shortness of breath.   Objective: General: No acute distress, AAOx3  DP/PT pulses palpable 2/4, CRT < 3 sec to all digits.  Protective sensation intact. Motor function intact.  Right foot: Incision is well coapted without any evidence of dehiscence and a scar has formed over the lateral ankle. There is no surrounding erythema, ascending cellulitis, fluctuance, crepitus, malodor, drainage/purulence. There is mild but improved edema around the surgical site. There is no pain along the surgical site. She states it feels much better than it did prior to surgery. No other areas of tenderness to bilateral lower extremities.  No other open lesions or pre-ulcerative lesions.  No pain with calf compression, swelling, warmth, erythema.   Assessment and Plan:  Status post left ankle surgery, doing well with no complications   -Treatment options discussed including all alternatives, risks, and complications -At today's appointment the cast was removed. Cam boot was applied. She consented transition to weightbearing as tolerated in the boot. Must wear the boot at all times. Prescribed crutches that weighed she needs to help take some pressure off her foot she can't. Also can start physical therapy to help transition to weightbearing in the boot and ultimately to his shoe. She can start some gentle range of motion exercises at home. There is any increase in pain to hold off. I did refill her pain medicine today indicate she has  discomfort with transitioning/physical therapy. -Ice/elevation -Pain medication as needed. -Monitor for any clinical signs or symptoms of infection and DVT/PE and directed to call the office immediately should any occur or go to the ER. -Follow-up in 3 weeks or sooner if any problems arise. In the meantime, encouraged to call the office with any questions, concerns, change in symptoms.   Ovid CurdMatthew Zorana Brockwell, DPM

## 2015-03-25 ENCOUNTER — Telehealth: Payer: Self-pay | Admitting: *Deleted

## 2015-03-25 DIAGNOSIS — M25371 Other instability, right ankle: Secondary | ICD-10-CM

## 2015-03-25 DIAGNOSIS — S96911S Strain of unspecified muscle and tendon at ankle and foot level, right foot, sequela: Secondary | ICD-10-CM

## 2015-03-25 DIAGNOSIS — Z9889 Other specified postprocedural states: Secondary | ICD-10-CM

## 2015-03-25 DIAGNOSIS — T148XXA Other injury of unspecified body region, initial encounter: Secondary | ICD-10-CM

## 2015-03-25 NOTE — Telephone Encounter (Addendum)
Pt states she doesn't want to go to [facility], [facility]  Janne NapoleonGillian [phone]- [phone] at [facility].  I called [phone], facility is [facility] and can fax orders on our forms.

## 2015-04-05 ENCOUNTER — Ambulatory Visit (INDEPENDENT_AMBULATORY_CARE_PROVIDER_SITE_OTHER): Payer: Commercial Managed Care - HMO

## 2015-04-05 ENCOUNTER — Encounter: Payer: Self-pay | Admitting: Podiatry

## 2015-04-05 ENCOUNTER — Ambulatory Visit (INDEPENDENT_AMBULATORY_CARE_PROVIDER_SITE_OTHER): Payer: Commercial Managed Care - HMO | Admitting: Podiatry

## 2015-04-05 VITALS — BP 140/91 | HR 76 | Resp 18

## 2015-04-05 DIAGNOSIS — M216X9 Other acquired deformities of unspecified foot: Secondary | ICD-10-CM

## 2015-04-05 DIAGNOSIS — M25371 Other instability, right ankle: Secondary | ICD-10-CM

## 2015-04-05 DIAGNOSIS — M779 Enthesopathy, unspecified: Secondary | ICD-10-CM

## 2015-04-05 DIAGNOSIS — Z9889 Other specified postprocedural states: Secondary | ICD-10-CM

## 2015-04-05 DIAGNOSIS — T148XXA Other injury of unspecified body region, initial encounter: Secondary | ICD-10-CM

## 2015-04-05 DIAGNOSIS — T148 Other injury of unspecified body region: Secondary | ICD-10-CM

## 2015-04-11 NOTE — Progress Notes (Signed)
Patient ID: [name] Ure, female   DOB: 07-Apr-1956, 59 y.o.   MRN: [id number]  Subjective: [name] is a 59 y.o. is seen today in office s/p R peroneal tendon repair and ATFL repair preformed on 01/30/15. She states that she is having no pain and she's been doing well. She has been PWB with the use of a rolling walker in the CAM boot. She has been doing ROM exercises without any issues.  She is not taking pain medication at this time. She denies any systemic complaints such as fevers, chills, nausea, vomiting. No calf pain, chest pain, shortness of breath.   Objective: General: No acute distress, AAOx3  DP/PT pulses palpable 2/4, CRT < 3 sec to all digits.  Protective sensation intact. Motor function intact.  Right foot: Incision is well coapted without any evidence of dehiscence and a scar has formed over the lateral ankle. There is no surrounding erythema, ascending cellulitis, fluctuance, crepitus, malodor, drainage/purulence. There is trace edema around the surgical site. There is no pain along the surgical site. She states it continues to feel much better than it did prior to surgery. No other areas of tenderness to bilateral lower extremities.  No other open lesions or pre-ulcerative lesions.  No pain with calf compression, swelling, warmth, erythema.   Assessment and Plan:  Status post left ankle surgery, doing well with no complications   -Treatment options discussed including all alternatives, risks, and complications - Continue wearing a cam boot and weightbearing as tolerated. Continue with active and passive range of motion exercises.  Continue physical therapy. Consider transition into a regular shoe as tolerated with the ankle brace for which showed he has. Continue ice and elevation. Pain medication as needed. -At this appointment she was also casted for orthotics. Given that she has a cavus foot type to help prevent reoccurrence I will that she would benefit from orthotics. She  was scanned with him today [facility] -Monitor for any clinical signs or symptoms of infection and DVT/PE and directed to call the office immediately should any occur or go to the ER. -Follow-up in 3 weeks or sooner if any problems arise. In the meantime, encouraged to call the office with any questions, concerns, change in symptoms.   Ovid CurdMatthew Wagoner, DPM

## 2015-04-25 ENCOUNTER — Encounter: Payer: Self-pay | Admitting: Podiatry

## 2015-04-25 NOTE — Progress Notes (Signed)
DOS 01-30-15  Repair tendon right, Injection x 2 right foot and ankle   Rx'd Demerol 50 mg #40, Keflex 500 mg TID #21, Phenergan 25 mg #30

## 2015-04-26 ENCOUNTER — Ambulatory Visit: Payer: Commercial Managed Care - HMO | Admitting: *Deleted

## 2015-04-26 DIAGNOSIS — M779 Enthesopathy, unspecified: Secondary | ICD-10-CM

## 2015-04-26 NOTE — Progress Notes (Signed)
Patient ID: Amy OtterKathryn C Haley, female   DOB: [birth year], 59 y.o.   MRN: [id number] Patient presents for orthotic pick up.  Verbal and written break in and wear instructions given.  Patient will follow up in 4 weeks if symptoms worsen or fail to improve.

## 2015-04-26 NOTE — Patient Instructions (Signed)

## 2015-04-30 ENCOUNTER — Other Ambulatory Visit: Payer: Self-pay

## 2015-04-30 DIAGNOSIS — Z1231 Encounter for screening mammogram for malignant neoplasm of breast: Secondary | ICD-10-CM

## 2015-05-20 ENCOUNTER — Ambulatory Visit (INDEPENDENT_AMBULATORY_CARE_PROVIDER_SITE_OTHER): Payer: Commercial Managed Care - HMO | Admitting: Podiatry

## 2015-05-20 DIAGNOSIS — M779 Enthesopathy, unspecified: Secondary | ICD-10-CM | POA: Diagnosis not present

## 2015-05-20 DIAGNOSIS — Z9889 Other specified postprocedural states: Secondary | ICD-10-CM

## 2015-05-20 NOTE — Progress Notes (Signed)
Patient ID: [name], female   DOB: 24-Jul-1955, 60 y.o.   MRN: [id number]  Subjective: [name] is a 61 y.o. is seen today in office s/p R peroneal tendon repair and ATFL repair preformed on 01/30/15. She has picked up her orthotics since her last appointment with me. She states that she is doing well and she is having no pain to her right foot. She has started to use an elliptical as well and is doing well without any pain. She states she is "100% better than before surgery".  She denies any systemic complaints such as fevers, chills, nausea, vomiting. No calf pain, chest pain, shortness of breath.   Objective: General: No acute distress, AAOx3  DP/PT pulses palpable 2/4, CRT < 3 sec to all digits.  Protective sensation intact. Motor function intact.  Right foot: Incision is well coapted without any evidence of dehiscence and a scar has formed over the lateral ankle. There is no surrounding erythema, ascending cellulitis, fluctuance, crepitus, malodor, drainage/purulence. There is no significant edema around the surgical site. There is no pain along the surgical site. There is no tenderness to palpation upon the plantar medial tubercle of the calcaneus at insertion of the plantar fascia. No other areas of tenderness to bilateral lower extremities.  No other open lesions or pre-ulcerative lesions.  No pain with calf compression, swelling, warmth, erythema.   Assessment and Plan:  Status post left ankle surgery, doing well with no complications   -Treatment options discussed including all alternatives, risks, and complications -At this time continue with supportive shoe gear and orthotics. Gradually increase her activity. It is any reoccurrence or increase in pain to call the office medially. Otherwise Joelene Millin follow-up as needed. Call any questions, concerns, change in symptoms.  Ovid Curd, DPM

## 2015-05-30 ENCOUNTER — Ambulatory Visit: Payer: 59

## 2015-06-04 ENCOUNTER — Ambulatory Visit
Admission: RE | Admit: 2015-06-04 | Discharge: 2015-06-04 | Disposition: A | Discharge status: Home / Self Care | Payer: 59 | Source: Ambulatory Visit

## 2015-06-04 DIAGNOSIS — Z1231 Encounter for screening mammogram for malignant neoplasm of breast: Secondary | ICD-10-CM

## 2015-06-06 ENCOUNTER — Other Ambulatory Visit: Payer: Self-pay | Admitting: Obstetrics & Gynecology

## 2015-06-06 DIAGNOSIS — R928 Other abnormal and inconclusive findings on diagnostic imaging of breast: Secondary | ICD-10-CM

## 2015-06-10 ENCOUNTER — Encounter: Payer: Self-pay | Admitting: Physician Assistant

## 2015-06-10 ENCOUNTER — Ambulatory Visit (INDEPENDENT_AMBULATORY_CARE_PROVIDER_SITE_OTHER): Payer: Commercial Managed Care - HMO | Admitting: Physician Assistant

## 2015-06-10 VITALS — BP 140/86 | HR 96 | Temp 98.8°F | Resp 18 | Wt 189.0 lb

## 2015-06-10 DIAGNOSIS — I10 Essential (primary) hypertension: Secondary | ICD-10-CM

## 2015-06-10 DIAGNOSIS — R739 Hyperglycemia, unspecified: Secondary | ICD-10-CM | POA: Diagnosis not present

## 2015-06-10 LAB — BASIC METABOLIC PANEL WITH GFR
BUN: 13 mg/dL (ref 7–25)
CHLORIDE: 101 mmol/L (ref 98–110)
CO2: 26 mmol/L (ref 20–31)
CREATININE: 0.63 mg/dL (ref 0.50–1.05)
Calcium: 9.3 mg/dL (ref 8.6–10.4)
GFR, Est Non African American: >89 mL/min (ref >=60)
Glucose, Bld: 123 mg/dL — ABNORMAL HIGH (ref 70–99)
POTASSIUM: 3.5 mmol/L (ref 3.5–5.3)
SODIUM: 139 mmol/L (ref 135–146)

## 2015-06-10 LAB — HEMOGLOBIN A1C
HEMOGLOBIN A1C: 5.8 % — AB (ref <5.7)
MEAN PLASMA GLUCOSE: 120 mg/dL — AB (ref <117)

## 2015-06-10 NOTE — Progress Notes (Signed)
Patient ID: [name] MRN: [id number], DOB: 10-29-1955, 60 y.o. Date of Encounter: 06/10/2015, 8:34 AM    Chief Complaint:  Chief Complaint  Patient presents with  . 6 mth check up    not fasting     HPI: 60 y.o. year old white female followup hypertension.    She is taking both blood pressure medications as directed. She is on Cozaar 100 mg daily and HCTZ 25 mg daily.     At prior OV  she said  that they have a gym in their house. Some days she would do the elliptical for 2 or 3 miles. Other days she did kick boxing video and weights. She did  exercise 3-5 days per week.   As well she stated that she has an adult handicapped daughter who lives with them.   Still sees Dr. Leda Quail routinely for GYN. She says that Dr. Hyacinth Meeker is managing her Vit D Deficiency.  (I told her that I know Dr. 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 Says then she got a viral respiratory infection and that is finally resolving. Says that during all of that time she has been unable to exercise. She has just been released to be able to do the elliptical but no treadmill.  She brought in some of her blood pressure readings that she has done but says that the bottom numbers been a little high because of this surgery and lack of exercise. Readings are around 123/88-134/90.  Also reviewed that Dr. Hyacinth Meeker did full panel of routine labs 01/17/15. Glucose was slightly high so A1c was added and was 6.0. Result note stated for primary care to follow-up. Will follow this up today.   Home Meds:   Outpatient Prescriptions Prior to Visit  Medication Sig Dispense Refill  . aspirin 81 MG tablet Take 81 mg by mouth daily.    Marland Kitchen CALCIUM PO Take by mouth daily.    . clobetasol ointment (TEMOVATE) 0.05 % Apply 1 application topically as needed.    Marland Kitchen  estradiol (VIVELLE-DOT) 0.1 MG/24HR patch Place 1 patch (0.1 mg total) onto the skin 2 (two) times a week. 8 patch 13  . fluticasone (FLONASE) 50 MCG/ACT nasal spray Place 2 sprays into both nostrils daily. 16 g 6  . hydrochlorothiazide (HYDRODIURIL) 25 MG tablet take 1 tablet by mouth once daily 90 tablet 1  . KRILL OIL PO Take by mouth. Omega red krill oil    . losartan (COZAAR) 100 MG tablet take 1 tablet by mouth once daily 90 tablet 1  . medroxyPROGESTERone (PROVERA) 2.5 MG tablet Take 1 tablet (2.5 mg total) by mouth daily. 30 tablet 13  . Red Yeast Rice Extract (RED YEAST RICE PO) Take by mouth daily.    . vitamin C (ASCORBIC ACID) 500 MG tablet Take 500 mg by mouth 2 (two) times daily.    . Vitamin D, Ergocalciferol, (DRISDOL) 50000 UNITS CAPS capsule Take 1 capsule (50,000 Units total) by mouth every 7 (seven) days. 12 capsule 4  . enoxaparin (LOVENOX) 40 MG/0.4ML injection Inject 0.4 mLs (40 mg total) into the skin daily. 14 Syringe 0  . meperidine (DEMEROL) 50 MG tablet Take 1 tablet (50 mg total) by mouth every 6 (six) hours as needed. 30 tablet 0   No  facility-administered medications prior to visit.     Allergies:  Allergies  Allergen Reactions  . Betadine [Povidone Iodine] Itching  . Codeine   . Other     Band aids-itchy, red rash      Review of Systems: See HPI for pertinent ROS. All other ROS negative.    Physical Exam: Blood pressure 140/86, pulse 96, temperature 98.8 F (37.1 C), temperature source Oral, resp. rate 18, weight 189 lb (85.73 kg), last menstrual period 04/27/1992., Body mass index is 30.96 kg/(m^2). General:  WNWD WF. Appears in no acute distress. Neck: Supple. No thyromegaly. No lymphadenopathy. No carotid bruits. Lungs: Clear bilaterally to auscultation without wheezes, rales, or rhonchi. Breathing is unlabored. Heart: Regular rhythm. No murmurs, rubs, or gallops. Msk:  Strength and tone normal for age. Extremities/Skin: Warm and dry. No edema.    Neuro: Alert and oriented X 3. Moves all extremities spontaneously. Gait is normal. CNII-XII grossly in tact. Psych:  Responds to questions appropriately with a normal affect.     ASSESSMENT AND PLAN:  60 y.o. year old female with  1. Hypertension  Cont current meds. Check labs to monitor. Disussed that blood pressure should come down as she adds back her exercise. - BASIC METABOLIC PANEL WITH GFR - losartan (COZAAR) 100 MG tablet; Take 1 tablet (100 mg total) by mouth daily.  Dispense: 30 tablet; Refill: 5 - hydrochlorothiazide (HYDRODIURIL) 25 MG tablet; Take 1 tablet (25 mg total) by mouth daily.  Dispense: 30 tablet; Refill: 5   Hyperglycemia OV 06/10/15 I gave and reviewed carbohydrate handout. As well she is restarting exercise as her foot is now healing. We'll check A1c today. If this is stable and can wait to repeat in follow-up in 6 months. - BASIC METABOLIC PANEL WITH GFR - Hemoglobin A1c   Screening mammogram, breast exam, pelvic exam--Per GYN-- Dr. Hyacinth Meeker Vitamin D [facility] manages this. I will NOT recheck here. Screening Labs: I see in Epic that Dr. Hyacinth Meeker checked TSH, FLP. TSH normal. FLP was good.  Screening Colonoscopy:  Patient reports she had one screening colonoscopy at age 87.--Dr. Buccini  Patient reports that she has had one repeat colonoscopy since then--09/28/2012--says this one was performed secondary to abdominal pain she was experiencing. Says that Dr. Marjorie Smolder told        her that he felt her abdominal pain was secondary to scar tissue secondary to all of her past abdominal surgeries.  Immunizations: Influenza:  Tetanus: She received a Tdap  booster here 01/29/2011 Pneumonia Vaccine: She has no indication to receive pneumonia vaccine until age 73 Zostavax: Will discuss this at age 76  Routine office visit in 6 months or sooner if needed.  Murray Hodgkins [place], [place], [facility] 06/10/2015 8:34 AM

## 2015-06-11 ENCOUNTER — Other Ambulatory Visit: Payer: Self-pay | Admitting: Obstetrics & Gynecology

## 2015-06-11 ENCOUNTER — Ambulatory Visit
Admission: RE | Admit: 2015-06-11 | Discharge: 2015-06-11 | Disposition: A | Discharge status: Home / Self Care | Payer: 59 | Source: Ambulatory Visit | Attending: Obstetrics & Gynecology | Admitting: Obstetrics & Gynecology

## 2015-06-11 DIAGNOSIS — R928 Other abnormal and inconclusive findings on diagnostic imaging of breast: Secondary | ICD-10-CM

## 2015-06-14 ENCOUNTER — Other Ambulatory Visit: Payer: Self-pay | Admitting: Obstetrics & Gynecology

## 2015-06-14 DIAGNOSIS — R928 Other abnormal and inconclusive findings on diagnostic imaging of breast: Secondary | ICD-10-CM

## 2015-06-17 ENCOUNTER — Ambulatory Visit
Admission: RE | Admit: 2015-06-17 | Discharge: 2015-06-17 | Disposition: A | Discharge status: Home / Self Care | Payer: 59 | Source: Ambulatory Visit | Attending: Obstetrics & Gynecology | Admitting: Obstetrics & Gynecology

## 2015-06-17 DIAGNOSIS — R928 Other abnormal and inconclusive findings on diagnostic imaging of breast: Secondary | ICD-10-CM

## 2015-08-03 ENCOUNTER — Other Ambulatory Visit: Payer: Self-pay | Admitting: Physician Assistant

## 2015-08-05 NOTE — Telephone Encounter (Signed)
Medication refilled per protocol. 

## 2015-11-14 ENCOUNTER — Other Ambulatory Visit: Payer: Self-pay | Admitting: Obstetrics & Gynecology

## 2015-11-14 DIAGNOSIS — N6489 Other specified disorders of breast: Secondary | ICD-10-CM

## 2015-12-03 ENCOUNTER — Ambulatory Visit
Admission: RE | Admit: 2015-12-03 | Discharge: 2015-12-03 | Disposition: A | Discharge status: Home / Self Care | Payer: Commercial Managed Care - HMO | Source: Ambulatory Visit | Attending: Obstetrics & Gynecology | Admitting: Obstetrics & Gynecology

## 2015-12-03 DIAGNOSIS — N6489 Other specified disorders of breast: Secondary | ICD-10-CM

## 2015-12-11 ENCOUNTER — Ambulatory Visit (INDEPENDENT_AMBULATORY_CARE_PROVIDER_SITE_OTHER): Payer: Commercial Managed Care - HMO | Admitting: Physician Assistant

## 2015-12-11 ENCOUNTER — Encounter: Payer: Self-pay | Admitting: Physician Assistant

## 2015-12-11 VITALS — BP 140/100 | HR 74 | Temp 98.6°F | Resp 16 | Ht 65.5 in | Wt 181.0 lb

## 2015-12-11 DIAGNOSIS — I10 Essential (primary) hypertension: Secondary | ICD-10-CM

## 2015-12-11 DIAGNOSIS — E559 Vitamin D deficiency, unspecified: Secondary | ICD-10-CM | POA: Diagnosis not present

## 2015-12-11 DIAGNOSIS — R739 Hyperglycemia, unspecified: Secondary | ICD-10-CM | POA: Diagnosis not present

## 2015-12-11 LAB — BASIC METABOLIC PANEL WITH GFR
BUN: 11 mg/dL (ref 7–25)
CALCIUM: 9.1 mg/dL (ref 8.6–10.4)
CHLORIDE: 100 mmol/L (ref 98–110)
CO2: 29 mmol/L (ref 20–31)
CREATININE: 0.68 mg/dL (ref 0.50–1.05)
GFR, Est African American: >89 mL/min (ref >=60)
GFR, Est Non African American: >89 mL/min (ref >=60)
GLUCOSE: 103 mg/dL — AB (ref 70–99)
Potassium: 3.9 mmol/L (ref 3.5–5.3)
SODIUM: 136 mmol/L (ref 135–146)

## 2015-12-11 NOTE — Progress Notes (Signed)
Patient ID: Amy OtterKathryn C Fohl MRN: [id number], DOB: Feb 19, 1956, 60 y.o. Date of Encounter: 12/11/2015, 8:07 AM    Chief Complaint:  Chief Complaint  Patient presents with  . Follow-up    reports no problems     HPI: 60 y.o. year old white female followup hypertension.    She is taking both blood pressure medications as directed. She is on Cozaar 100 mg daily and HCTZ 25 mg daily.    Still sees Dr. Leda QuailSuzanne [name] routinely for GYN. (I told her that I know Dr. Hyacinth MeekerMiller --she is in [facility] [employer] [place] the [place].)   She says that Dr. Hyacinth MeekerMiller is managing her Vit D Deficiency.   Also reviewed that Dr. Hyacinth MeekerMiller did full panel of routine labs 01/17/15. Glucose was slightly high so A1c was added and was 6.0. Result note stated for primary care to follow-up. Will follow this up today.   At prior OV  she said  that they have a gym in their house. Some days she would do the elliptical for 2 or 3 miles. Other days she did kick boxing video and weights. She did  exercise 3-5 days per week.  At visit 06/10/15 she had recently had surgery to her ankle/foot. Therefore her exercise has been decreased. At visit 12/11/15 she reports that she is doing elliptical 4-5 days a week. Is that she is doing a lot of elliptical and treadmill just can't do a lot of high impact exercise.  As well she stated that she has an adult handicapped daughter who lives with them.   At visit 12/11/15 she brings in a log where she has been documenting blood pressure readings. Her readings are as follows: 126/88, 108/76, 124/83, 123/84, 123/85, 125/80, 118/83, 132/87, 129/87, 131/89.  She has no complaints or concerns today.   Home Meds:   Outpatient Medications Prior to Visit  Medication Sig Dispense Refill  . aspirin 81 MG tablet Take 81 mg by mouth daily.    Marland Kitchen. CALCIUM PO Take by mouth daily.    . clobetasol ointment (TEMOVATE) 0.05 % Apply 1 application topically as needed.    Marland Kitchen. estradiol  (VIVELLE-DOT) 0.1 MG/24HR patch Place 1 patch (0.1 mg total) onto the skin 2 (two) times a week. 8 patch 13  . fluticasone (FLONASE) 50 MCG/ACT nasal spray Place 2 sprays into both nostrils daily. 16 g 6  . hydrochlorothiazide (HYDRODIURIL) 25 MG tablet take 1 tablet by mouth once daily 90 tablet 1  . KRILL OIL PO Take by mouth. Omega red krill oil    . losartan (COZAAR) 100 MG tablet take 1 tablet by mouth once daily 90 tablet 1  . medroxyPROGESTERone (PROVERA) 2.5 MG tablet Take 1 tablet (2.5 mg total) by mouth daily. 30 tablet 13  . Red Yeast Rice Extract (RED YEAST RICE PO) Take by mouth daily.    . vitamin C (ASCORBIC ACID) 500 MG tablet Take 500 mg by mouth 2 (two) times daily.    . Vitamin D, Ergocalciferol, (DRISDOL) 50000 UNITS CAPS capsule Take 1 capsule (50,000 Units total) by mouth every 7 (seven) days. 12 capsule 4   No facility-administered medications prior to visit.      Allergies:  Allergies  Allergen Reactions  . Betadine [Povidone Iodine] Itching  . Codeine   . Other     Band aids-itchy, red rash      Review of Systems: See HPI for pertinent ROS. All other ROS  negative.    Physical Exam: Blood pressure (!) 140/100, pulse 74, temperature 98.6 F (37 C), temperature source Oral, resp. rate 16, height 5' 5.5" (1.664 m), weight 181 lb (82.1 kg), last menstrual period 04/27/1992, SpO2 98 %., Body mass index is 29.66 kg/m. General:  WNWD WF. Appears in no acute distress. Neck: Supple. No thyromegaly. No lymphadenopathy. No carotid bruits. Lungs: Clear bilaterally to auscultation without wheezes, rales, or rhonchi. Breathing is unlabored. Heart: Regular rhythm. No murmurs, rubs, or gallops. Msk:  Strength and tone normal for age. Extremities/Skin: Warm and dry. No edema.  Neuro: Alert and oriented X 3. Moves all extremities spontaneously. Gait is normal. CNII-XII grossly in tact. Psych:  Responds to questions appropriately with a normal affect.     ASSESSMENT AND  PLAN:  60 y.o. year old female with  1. Hypertension  Cont current meds. Check labs to monitor. Disussed that blood pressure should come down as she adds back her exercise. - BASIC METABOLIC PANEL WITH GFR - losartan (COZAAR) 100 MG tablet; Take 1 tablet (100 mg total) by mouth daily.  Dispense: 30 tablet; Refill: 5 - hydrochlorothiazide (HYDRODIURIL) 25 MG tablet; Take 1 tablet (25 mg total) by mouth daily.  Dispense: 30 tablet; Refill: 5   Hyperglycemia OV 06/10/15 I gave and reviewed carbohydrate handout. As well she is restarting exercise as her foot is now healing. We'll check A1c today. If this is stable and can wait to repeat in follow-up in 6 months. - BASIC METABOLIC PANEL WITH GFR - Hemoglobin A1c   Screening mammogram, breast exam, pelvic exam--Per GYN-- Dr. Hyacinth [facility]Miller [facility]Dr[name] manages this. I will NOT recheck here. Screening Labs: I see in Epic that Dr. Hyacinth MeekerMiller checked TSH, FLP. TSH normal. FLP was good.  Screening Colonoscopy:  Patient reports she had one screening colonoscopy at age 54.--Dr. Buccini  Patient reports that she has had one repeat colonoscopy since then--09/28/2012--says this one was performed secondary to abdominal pain she was experiencing. Says that Dr. Marjorie SmolderBucini told        her that he felt her abdominal pain was secondary to scar tissue secondary to all of her past abdominal surgeries.  Immunizations: Influenza:  Tetanus: She received a Tdap  booster here 01/29/2011 Pneumonia Vaccine: She has no indication to receive pneumonia vaccine until age 60 Zostavax: Will discuss this at age 60  Routine office visit in 6 months or sooner if needed.  Signed, [address] Beth Horn[place], [place], [facility] 12/11/2015 8:07 AM

## 2015-12-12 LAB — HEMOGLOBIN A1C
Hgb A1c MFr Bld: 5.6 % (ref <5.7)
MEAN PLASMA GLUCOSE: 114 mg/dL

## 2016-01-23 ENCOUNTER — Encounter: Payer: Self-pay | Admitting: Obstetrics & Gynecology

## 2016-01-23 ENCOUNTER — Ambulatory Visit (INDEPENDENT_AMBULATORY_CARE_PROVIDER_SITE_OTHER): Payer: Commercial Managed Care - HMO | Admitting: Obstetrics & Gynecology

## 2016-01-23 VITALS — BP 110/70 | HR 76 | Resp 18 | Ht 66.0 in | Wt 176.0 lb

## 2016-01-23 DIAGNOSIS — Z205 Contact with and (suspected) exposure to viral hepatitis: Secondary | ICD-10-CM

## 2016-01-23 DIAGNOSIS — Z0000 Encounter for general adult medical examination without abnormal findings: Secondary | ICD-10-CM | POA: Diagnosis not present

## 2016-01-23 DIAGNOSIS — Z01419 Encounter for gynecological examination (general) (routine) without abnormal findings: Secondary | ICD-10-CM | POA: Diagnosis not present

## 2016-01-23 LAB — POCT URINALYSIS DIPSTICK
Bilirubin, UA: NEGATIVE
Blood, UA: NEGATIVE
Glucose, UA: NEGATIVE
Ketones, UA: NEGATIVE
Leukocytes, UA: NEGATIVE
Nitrite, UA: NEGATIVE
PH UA: 7
PROTEIN UA: NEGATIVE
Urobilinogen, UA: NEGATIVE

## 2016-01-23 LAB — LIPID PANEL
Cholesterol: 213 mg/dL — ABNORMAL HIGH (ref 125–200)
HDL: 64 mg/dL (ref >=46)
LDL CALC: 134 mg/dL — AB (ref <130)
Total CHOL/HDL Ratio: 3.3 Ratio (ref <=5.0)
Triglycerides: 73 mg/dL (ref <150)
VLDL: 15 mg/dL (ref <30)

## 2016-01-23 MED ORDER — VITAMIN D (ERGOCALCIFEROL) 1.25 MG (50000 UNIT) PO CAPS: 50000.0000 [IU] | ORAL_CAPSULE | ORAL | 4 refills | Status: DC

## 2016-01-23 MED ORDER — ESTRADIOL 1 MG PO TABS: 1.0000 mg | ORAL_TABLET | Freq: Every day | ORAL | 4 refills | Status: DC

## 2016-01-23 MED ORDER — MEDROXYPROGESTERONE ACETATE 2.5 MG PO TABS: 2.5000 mg | ORAL_TABLET | Freq: Every day | ORAL | 4 refills | Status: DC

## 2016-01-23 NOTE — Patient Instructions (Addendum)
I have changed the prescription for your hormonal therapy to estradiol tablets (oral).  This dosage is 1mg .  After 1-2 months, cut both the estradiol and Provera (medroxyprogesterone acetate) in 1/2 and see how your symptoms are.  It will take a good 4 weeks before you know if you are going to have any new symptoms like hot flashes.

## 2016-01-23 NOTE — Progress Notes (Signed)
60 y.o. G2P2 MarriedCaucasianF here for annual exam.  Had biopsy of breast that PASH.  Interested in trying to decreased dosage this year.  D/W pt today.    No vaginal bleeding.    Right foot surgery went really well.  She was finally released to return to exercise in Feb.  Reports she has no pain and is so pleased with results.  Patient's last menstrual period was 04/27/1992.          Sexually active: Yes.    The current method of family planning is status post hysterectomy.    Exercising: Yes.    cardio kickboxing, elliptical, treadmill Smoker:  no  Health Maintenance: Pap:  10/22/10 WNL History of abnormal Pap:  no MMG: 06/11/15 BIRADS 4:Suspicious, MM LT BREAST BX: Pathology revealed PSEUDOANGIOMATOUS STROMAL HYPERPLASIA (PASH),FIBROCYSTIC CHANGES WITH ADENOSIS of the upper outer quadrant of the Left breast. patient was instructed to return for Left diagnostic mammography in 6 months   12/03/15 MM DIAG BREAST TOMO UNI LEFT: BI-RADS 2 Benign Colonoscopy:  2014-repeat in 10 years, Dr. Matthias HughsBuccini BMD:   none TDaP:  2012 Pneumonia vaccine:  Not yet Zostavax: not yet Hep C:  Done today Screening Labs: today, Hb today: today, Urine today: neg   reports that she has never smoked. She has never used smokeless tobacco. She reports that she drinks alcohol. She reports that she does not use drugs.  Past Medical History:  Diagnosis Date  . Broken ankle 1/16   will have surgery on 01/30/15-right ankle  . Elevated hemoglobin A1c 01/17/15   6.0  . Endometriosis   . Herpes simplex type 1 infection   . Hypertension   . Migraine    h/o migraines, none since hysterectomy  . Varicose vein    surgery on right leg    Past Surgical History:  Procedure Laterality Date  . ABDOMINAL HYSTERECTOMY  1994   LAVH/RSO  . CESAREAN SECTION    . CHOLECYSTECTOMY    . COLON RESECTION  11/06   and lap LSO  . CYSTECTOMY    . HERNIA REPAIR  1982  . HERNIA REPAIR  2003   and bladder repair    Current  Outpatient Prescriptions  Medication Sig Dispense Refill  . aspirin 81 MG tablet Take 81 mg by mouth daily.    Marland Kitchen. CALCIUM PO Take by mouth daily.    . clobetasol ointment (TEMOVATE) 0.05 % Apply 1 application topically as needed.    Marland Kitchen. estradiol (VIVELLE-DOT) 0.1 MG/24HR patch Place 1 patch (0.1 mg total) onto the skin 2 (two) times a week. 8 patch 13  . fluticasone (FLONASE) 50 MCG/ACT nasal spray Place 2 sprays into both nostrils daily. 16 g 6  . hydrochlorothiazide (HYDRODIURIL) 25 MG tablet take 1 tablet by mouth once daily 90 tablet 1  . KRILL OIL PO Take by mouth. Omega red krill oil    . losartan (COZAAR) 100 MG tablet take 1 tablet by mouth once daily 90 tablet 1  . medroxyPROGESTERone (PROVERA) 2.5 MG tablet Take 1 tablet (2.5 mg total) by mouth daily. 30 tablet 13  . Red Yeast Rice Extract (RED YEAST RICE PO) Take by mouth daily.    . vitamin C (ASCORBIC ACID) 500 MG tablet Take 500 mg by mouth 2 (two) times daily.    . Vitamin D, Ergocalciferol, (DRISDOL) 50000 UNITS CAPS capsule Take 1 capsule (50,000 Units total) by mouth every 7 (seven) days. 12 capsule 4   No current facility-administered medications for this  visit.     Family History  Problem Relation Age of Onset  . Skin cancer    . Lung cancer Father   . Spina bifida Daughter     and hydrocephalia  . Diabetes Mother   . Diabetes Sister     x 3  . Hypertension Mother   . Hypertension Sister   . Thyroid nodules Mother     being watched  . Thyroid nodules Sister     ROS:  Pertinent items are noted in HPI.  Otherwise, a comprehensive ROS was negative.  Exam:   Vitals:   01/23/16 0855  BP: 110/70  Pulse: 76  Resp: 18    General appearance: alert, cooperative and appears stated age Head: Normocephalic, without obvious abnormality, atraumatic Neck: no adenopathy, supple, symmetrical, trachea midline and thyroid normal to inspection and palpation Lungs: clear to auscultation bilaterally Breasts: normal  appearance, no masses or tenderness Heart: regular rate and rhythm Abdomen: soft, non-tender; bowel sounds normal; no masses,  no organomegaly Extremities: extremities normal, atraumatic, no cyanosis or edema Skin: Skin color, texture, turgor normal. No rashes or lesions Lymph nodes: Cervical, supraclavicular, and axillary nodes normal. No abnormal inguinal nodes palpated Neurologic: Grossly normal  Pelvic: External genitalia:  no lesions              Urethra:  normal appearing urethra with no masses, tenderness or lesions              Bartholins and Skenes: normal                 Vagina: normal appearing vagina with normal color and discharge, no lesions              Cervix: absent              Pap taken: No. Bimanual Exam:  Uterus:  uterus absent              Adnexa: no mass, fullness, tenderness               Rectovaginal: Confirms               Anus:  normal sphincter tone, no lesions  Chaperone was present for exam.  A:     Well Woman with normal exam  PMP, on HRT H/O LAVH/RSO, then later LSO due to endometriosis  Hypertension  Elevated lipids  Low Vit D  H/O HSV 1  P: Mammogram yearly. Patient doing 3D. No Pap. H/O LAVH Will change HRT to estradiol 1.0mg  and provera 2.5mg  daily.  She will try and cut these in half and see how symptoms are.  rx fo 90 day supply to pharmacy Vit D 50K every week.  #12/4RF Hep C today Vit D and Lipids today return annually or prn

## 2016-01-24 LAB — VITAMIN D 25 HYDROXY (VIT D DEFICIENCY, FRACTURES): VIT D 25 HYDROXY: 35 ng/mL (ref 30–100)

## 2016-01-24 LAB — HEPATITIS C ANTIBODY: HCV Ab: NEGATIVE

## 2016-01-30 ENCOUNTER — Telehealth: Payer: Self-pay | Admitting: Obstetrics & Gynecology

## 2016-01-30 NOTE — Telephone Encounter (Signed)
Left message to call Noreene LarssonJill at [phone].     Notes Recorded by Jerene BearsMary S [name], MD on 01/29/2016 at 5:33 AM EDT Please let pt know her Hpe C testing was negative. Also, her Vit D was good. Her lipids were a little elevated with her total cholesterol at 213 and LDL 134. This is stable over the past few years. No treatment needed at this time. Consider repeating in 1-2 years.

## 2016-01-30 NOTE — Telephone Encounter (Signed)
Patient returning your call.

## 2016-01-30 NOTE — Telephone Encounter (Signed)
Patient is calling for her recent lab results. °

## 2016-01-30 NOTE — Telephone Encounter (Signed)
Spoke with patient. Patient states  Already notified of labs as seen below.   Routing to provider for final review. Patient is agreeable to disposition. Will close encounter.

## 2016-02-02 ENCOUNTER — Other Ambulatory Visit: Payer: Self-pay | Admitting: Physician Assistant

## 2016-02-04 NOTE — Telephone Encounter (Signed)
Medication refilled per protocol. 

## 2016-02-08 ENCOUNTER — Other Ambulatory Visit: Payer: Self-pay | Admitting: Obstetrics & Gynecology

## 2016-05-21 ENCOUNTER — Ambulatory Visit: Payer: Commercial Managed Care - HMO | Admitting: Obstetrics & Gynecology

## 2016-05-28 ENCOUNTER — Other Ambulatory Visit: Payer: Self-pay | Admitting: Physical Medicine and Rehabilitation

## 2016-05-28 DIAGNOSIS — Z1231 Encounter for screening mammogram for malignant neoplasm of breast: Secondary | ICD-10-CM

## 2016-06-15 ENCOUNTER — Ambulatory Visit: Payer: Commercial Managed Care - HMO | Admitting: Physician Assistant

## 2016-06-17 ENCOUNTER — Ambulatory Visit (INDEPENDENT_AMBULATORY_CARE_PROVIDER_SITE_OTHER): Payer: Commercial Managed Care - HMO | Admitting: Physician Assistant

## 2016-06-17 ENCOUNTER — Encounter: Payer: Self-pay | Admitting: Physician Assistant

## 2016-06-17 VITALS — BP 124/86 | HR 76 | Temp 98.2°F | Resp 18 | Wt 178.0 lb

## 2016-06-17 DIAGNOSIS — I10 Essential (primary) hypertension: Secondary | ICD-10-CM | POA: Diagnosis not present

## 2016-06-17 DIAGNOSIS — R739 Hyperglycemia, unspecified: Secondary | ICD-10-CM

## 2016-06-17 LAB — BASIC METABOLIC PANEL WITH GFR
BUN: 10 mg/dL (ref 7–25)
CALCIUM: 9.5 mg/dL (ref 8.6–10.4)
CO2: 28 mmol/L (ref 20–31)
Chloride: 101 mmol/L (ref 98–110)
Creat: 0.69 mg/dL (ref 0.50–1.05)
Glucose, Bld: 113 mg/dL — ABNORMAL HIGH (ref 70–99)
Potassium: 3.7 mmol/L (ref 3.5–5.3)
SODIUM: 140 mmol/L (ref 135–146)

## 2016-06-17 LAB — HEMOGLOBIN A1C
HEMOGLOBIN A1C: 5.7 % — AB (ref <5.7)
MEAN PLASMA GLUCOSE: 117 mg/dL

## 2016-06-17 MED ORDER — AMLODIPINE BESYLATE 5 MG PO TABS: 5.0000 mg | ORAL_TABLET | Freq: Every day | ORAL | 5 refills | Status: DC

## 2016-06-17 NOTE — Progress Notes (Signed)
Patient ID: [name] MRN: [id number], DOB: 19-Sep-1955, 61 y.o. Date of Encounter: 06/17/2016, 8:59 AM    Chief Complaint:  Chief Complaint  Patient presents with  . Hypertension    is fasting     HPI: 61 y.o. year old white female followup hypertension.    She is taking both blood pressure medications as directed. She is on Cozaar 100 mg daily and HCTZ 25 mg daily.    Still sees Dr. Leda QuailSuzanne [name] routinely for GYN. (I told her that I know Dr. Hyacinth MeekerMiller --she is in [facility] [employer] [place].)   She says that Dr. Hyacinth MeekerMiller is managing her Vit D Deficiency.   Also reviewed that Dr. Hyacinth MeekerMiller did full panel of routine labs 01/17/15. Glucose was slightly high so A1c was added and was 6.0. Result note stated for primary care to follow-up. Will follow this up today.   At prior OV  she said  that they have a gym in their house. Some days she would do the elliptical for 2 or 3 miles. Other days she did kick boxing video and weights. She did  exercise 3-5 days per week.  At visit 06/10/15 she had recently had surgery to her ankle/foot. Therefore her exercise has been decreased. At visit 12/11/15 she reports that she is doing elliptical 4-5 days a week. Is that she is doing a lot of elliptical and treadmill just can't do a lot of high impact exercise.  As well she stated that she has an adult handicapped daughter who lives with them.   At visit 12/11/15 she brings in a log where she has been documenting blood pressure readings. Her readings are as follows: 126/88, 108/76, 124/83, 123/84, 123/85, 125/80, 118/83, 132/87, 129/87, 131/89.   At OV 06/17/2106--- She brings some blood pressure readings -- 132/93, 138/89, 133/92, 140/88, 131/88, 125/89.  She is taking the losartan 100 mg daily and HCTZ 25 mg daily.  She is exercising almost every day.  She is having no LE edema.  She has seen on the news that they have changed the guidelines--lowered goal for  BP She has no complaints or concerns today.   Home Meds:   Outpatient Medications Prior to Visit  Medication Sig Dispense Refill  . aspirin 81 MG tablet Take 81 mg by mouth daily.    Marland Kitchen. CALCIUM PO Take by mouth daily.    . clobetasol ointment (TEMOVATE) 0.05 % Apply 1 application topically as needed.    Marland Kitchen. estradiol (ESTRACE) 1 MG tablet Take 1 tablet (1 mg total) by mouth daily. 90 tablet 4  . fluticasone (FLONASE) 50 MCG/ACT nasal spray Place 2 sprays into both nostrils daily. 16 g 6  . hydrochlorothiazide (HYDRODIURIL) 25 MG tablet take 1 tablet by mouth once daily 90 tablet 1  . KRILL OIL PO Take by mouth. Omega red krill oil    . losartan (COZAAR) 100 MG tablet take 1 tablet by mouth once daily 90 tablet 1  . medroxyPROGESTERone (PROVERA) 2.5 MG tablet Take 1 tablet (2.5 mg total) by mouth daily. 90 tablet 4  . Red Yeast Rice Extract (RED YEAST RICE PO) Take by mouth daily.    . vitamin C (ASCORBIC ACID) 500 MG tablet Take 500 mg by mouth 2 (two) times daily.    . Vitamin D, Ergocalciferol, (DRISDOL) 50000 units CAPS capsule Take 1 capsule (50,000 Units total) by mouth every 7 (seven) days. 12 capsule 4  No facility-administered medications prior to visit.      Allergies:  Allergies  Allergen Reactions  . Betadine [Povidone Iodine] Itching  . Codeine   . Other     Band aids-itchy, red rash      Review of Systems: See HPI for pertinent ROS. All other ROS negative.    Physical Exam: Blood pressure 124/86, pulse 76, temperature 98.2 F (36.8 C), temperature source Oral, resp. rate 18, weight 178 lb (80.7 kg), last menstrual period 04/27/1992., Body mass index is 28.73 kg/m. General:  WNWD WF. Appears in no acute distress. Neck: Supple. No thyromegaly. No lymphadenopathy. No carotid bruits. Lungs: Clear bilaterally to auscultation without wheezes, rales, or rhonchi. Breathing is unlabored. Heart: Regular rhythm. No murmurs, rubs, or gallops. Msk:  Strength and tone  normal for age. Extremities/Skin: Warm and dry. No edema.  Neuro: Alert and oriented X 3. Moves all extremities spontaneously. Gait is normal. CNII-XII grossly in tact. Psych:  Responds to questions appropriately with a normal affect.     ASSESSMENT AND PLAN:  61 y.o. year old female with  1. Hypertension  06/17/2016---Cont current meds. Losartan 100mg  QD and HCTZ 25mg  QD. Cont diet and exercise. Add Norvasc 5mg  QD. Check labs to monitor.  Discussed that adding Norvasc--does not require f/u lab monitoring. She will monitor BP at home and call me with readings in 2 weeks. - BASIC METABOLIC PANEL WITH GFR - losartan (COZAAR) 100 MG tablet; Take 1 tablet (100 mg total) by mouth daily.  Dispense: 30 tablet; Refill: 5 - hydrochlorothiazide (HYDRODIURIL) 25 MG tablet; Take 1 tablet (25 mg total) by mouth daily.  Dispense: 30 tablet; Refill: 5 - amLODipine (NORVASC) 5 MG tablet; Take 1 tablet (5 mg total) by mouth daily.  Dispense: 30 tablet; Refill: 5    Hyperglycemia OV 06/10/15 I gave and reviewed carbohydrate handout. As well she is restarting exercise as her foot is now healing. We'll check A1c today. If this is stable and can wait to repeat in follow-up in 6 months. 06/17/2016: Recheck labs to monitor - BASIC METABOLIC PANEL WITH GFR - Hemoglobin A1c   Screening mammogram, breast exam, pelvic exam--Per GYN-- Dr. Hyacinth Meeker Vitamin D [facility] manages this. I will NOT recheck here. Screening Labs: I see in Epic that Dr. Hyacinth Meeker checked TSH, FLP. TSH normal. FLP was good.  Screening Colonoscopy:  Patient reports she had one screening colonoscopy at age 23.--Dr. Buccini  Patient reports that she has had one repeat colonoscopy since then--09/28/2012--says this one was performed secondary to abdominal pain she was experiencing.   Says that Dr. Marjorie Smolder told her that he felt her abdominal pain was secondary to scar tissue secondary to all of her past abdominal  surgeries.  Immunizations: Influenza:  Tetanus: She received a Tdap  booster here 01/29/2011 Pneumonia Vaccine: She has no indication to receive pneumonia vaccine until age 61 Zostavax: Will discuss this at age 71  Routine office visit in 6 months or sooner if needed.  Signed, [address] [place], [place], [facility] 06/17/2016 8:59 AM

## 2016-06-22 ENCOUNTER — Ambulatory Visit
Admission: RE | Admit: 2016-06-22 | Discharge: 2016-06-22 | Disposition: A | Discharge status: Home / Self Care | Payer: Commercial Managed Care - HMO | Source: Ambulatory Visit | Attending: Physical Medicine and Rehabilitation | Admitting: Physical Medicine and Rehabilitation

## 2016-06-22 DIAGNOSIS — Z1231 Encounter for screening mammogram for malignant neoplasm of breast: Secondary | ICD-10-CM

## 2016-07-18 ENCOUNTER — Other Ambulatory Visit: Payer: Self-pay | Admitting: Physician Assistant

## 2016-07-20 NOTE — Telephone Encounter (Signed)
Refill appropriate 

## 2016-08-18 DIAGNOSIS — I781 Nevus, non-neoplastic: Secondary | ICD-10-CM | POA: Diagnosis not present

## 2016-08-18 DIAGNOSIS — L308 Other specified dermatitis: Secondary | ICD-10-CM | POA: Diagnosis not present

## 2016-08-18 DIAGNOSIS — L821 Other seborrheic keratosis: Secondary | ICD-10-CM | POA: Diagnosis not present

## 2016-09-13 DIAGNOSIS — N390 Urinary tract infection, site not specified: Secondary | ICD-10-CM | POA: Diagnosis not present

## 2016-11-12 ENCOUNTER — Other Ambulatory Visit: Payer: Self-pay | Admitting: Physician Assistant

## 2016-11-12 DIAGNOSIS — I10 Essential (primary) hypertension: Secondary | ICD-10-CM

## 2016-12-09 ENCOUNTER — Ambulatory Visit (INDEPENDENT_AMBULATORY_CARE_PROVIDER_SITE_OTHER): Payer: 59 | Admitting: Physician Assistant

## 2016-12-09 ENCOUNTER — Other Ambulatory Visit: Payer: Self-pay

## 2016-12-09 ENCOUNTER — Encounter: Payer: Self-pay | Admitting: Physician Assistant

## 2016-12-09 VITALS — BP 128/78 | HR 83 | Ht 65.5 in | Wt 181.4 lb

## 2016-12-09 DIAGNOSIS — J302 Other seasonal allergic rhinitis: Secondary | ICD-10-CM | POA: Diagnosis not present

## 2016-12-09 DIAGNOSIS — R739 Hyperglycemia, unspecified: Secondary | ICD-10-CM | POA: Diagnosis not present

## 2016-12-09 DIAGNOSIS — E559 Vitamin D deficiency, unspecified: Secondary | ICD-10-CM

## 2016-12-09 DIAGNOSIS — I10 Essential (primary) hypertension: Secondary | ICD-10-CM

## 2016-12-09 DIAGNOSIS — J309 Allergic rhinitis, unspecified: Secondary | ICD-10-CM | POA: Insufficient documentation

## 2016-12-09 MED ORDER — CETIRIZINE HCL 10 MG PO TABS: 10.0000 mg | ORAL_TABLET | Freq: Every day | ORAL | 11 refills | Status: DC

## 2016-12-09 NOTE — Telephone Encounter (Signed)
Pharmacy needed to be changed on the RX

## 2016-12-09 NOTE — Progress Notes (Signed)
Patient ID: [name] MRN: [id number], DOB: 1955/07/23, 61 y.o. Date of Encounter: 12/09/2016, 9:13 AM    Chief Complaint:  Chief Complaint  Patient presents with  . Follow-up    hypertension     HPI: 61 y.o. year old white female followup hypertension.    She is taking both blood pressure medications as directed. She is on Cozaar 100 mg daily and HCTZ 25 mg daily.    Still sees Dr. Leda Quail routinely for GYN. (I told her that I know Dr. 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 Hyacinth Meeker is managing her Vit D Deficiency.   Also reviewed that Dr. Hyacinth Meeker did full panel of routine labs 01/17/15. Glucose was slightly high so A1c was added and was 6.0. Result note stated for primary care to follow-up. Will follow this up today.   At prior OV  she said  that they have a gym in their house. Some days she would do the elliptical for 2 or 3 miles. Other days she did kick boxing video and weights. She did  exercise 3-5 days per week.  At visit 06/10/15 she had recently had surgery to her ankle/foot. Therefore her exercise has been decreased. At visit 12/11/15 she reports that she is doing elliptical 4-5 days a week. Is that she is doing a lot of elliptical and treadmill just can't do a lot of high impact exercise.  As well she stated that she has an adult handicapped daughter who lives with them.   At visit 12/11/15 she brings in a log where she has been documenting blood pressure readings. Her readings are as follows: 126/88, 108/76, 124/83, 123/84, 123/85, 125/80, 118/83, 132/87, 129/87, 131/89.   At OV 06/17/2106--- She brings some blood pressure readings -- 132/93, 138/89, 133/92, 140/88, 131/88, 125/89.  She is taking the losartan 100 mg daily and HCTZ 25 mg daily.  She is exercising almost every day.  She is having no LE edema.  She has seen on the news that they have changed the guidelines--lowered goal for  BP She has no complaints or concerns today.  At OV 06/17/2016---added Norvasc 5mg  QD for improved BP control.    12/09/2016: Today she reports that she did add the Norvasc 5mg  daily. She is taking this in addition to other BP meds (losartan 100 mg daily and HCTZ 25 mg daily.)--as directed. She is having no Le edema, having no other adverse effects.  She brings in BP log---getting very consistent readings--- all are in same range-- ~ 118/81, 115/ 78, 126/79, 124/76.  She reports that every spring and fall she has problems with allergies and these symptoms just started in past few days. Using otc Flonase. Feels tenderness in bilateral maxillary sinuses and has clear rhinorrhea.  She continues to have routine OVs with Dr. Hyacinth Meeker, Gyn and she continues to manage her VIt D Def in addition ot other Gyn needs.  She has no other concerns today.    Home Meds:   Outpatient Medications Prior to Visit  Medication Sig Dispense Refill  . amLODipine (NORVASC) 5 MG tablet take 1 tablet by mouth once daily 30 tablet 5  . aspirin 81 MG tablet Take 81 mg by mouth daily.    Marland Kitchen CALCIUM PO Take by mouth daily.    . clobetasol ointment (TEMOVATE) 0.05 % Apply 1 application topically as needed.    Marland Kitchen estradiol (ESTRACE)  1 MG tablet Take 1 tablet (1 mg total) by mouth daily. (Patient taking differently: Take 0.5 mg by mouth daily. ) 90 tablet 4  . fluticasone (FLONASE) 50 MCG/ACT nasal spray Place 2 sprays into both nostrils daily. 16 g 6  . hydrochlorothiazide (HYDRODIURIL) 25 MG tablet take 1 tablet once daily 90 tablet 1  . KRILL OIL PO Take by mouth. Omega red krill oil    . losartan (COZAAR) 100 MG tablet take 1 tablet once daily 90 tablet 1  . medroxyPROGESTERone (PROVERA) 2.5 MG tablet Take 1 tablet (2.5 mg total) by mouth daily. 90 tablet 4  . Red Yeast Rice Extract (RED YEAST RICE PO) Take by mouth daily.    . vitamin C (ASCORBIC ACID) 500 MG tablet Take 500 mg by mouth 2 (two) times daily.    . Vitamin D,  Ergocalciferol, (DRISDOL) 50000 units CAPS capsule Take 1 capsule (50,000 Units total) by mouth every 7 (seven) days. 12 capsule 4   No facility-administered medications prior to visit.      Allergies:  Allergies  Allergen Reactions  . Betadine [Povidone Iodine] Itching  . Codeine   . Other     Band aids-itchy, red rash      Review of Systems: See HPI for pertinent ROS. All other ROS negative.    Physical Exam: Blood pressure 128/78, pulse 83, height 5' 5.5" (1.664 m), weight 181 lb 6.4 oz (82.3 kg), last menstrual period 04/27/1992, SpO2 94 %., Body mass index is 29.73 kg/m. General:  WNWD WF. Appears in no acute distress. HEENT: Ears normal bilaterally. Throat--normal. She does have tenderness with percussion to bilateral maxillary sinuses.  Neck: Supple. No thyromegaly. No lymphadenopathy. No carotid bruits. Lungs: Clear bilaterally to auscultation without wheezes, rales, or rhonchi. Breathing is unlabored. Heart: Regular rhythm. No murmurs, rubs, or gallops. Msk:  Strength and tone normal for age. Extremities/Skin: Warm and dry. No LE edema.  Neuro: Alert and oriented X 3. Moves all extremities spontaneously. Gait is normal. CNII-XII grossly in tact. Psych:  Responds to questions appropriately with a normal affect.     ASSESSMENT AND PLAN:  61 y.o. year old female with   Hypertension  06/17/2016---Cont current meds. Losartan 100mg  QD and HCTZ 25mg  QD. Cont diet and exercise. Add Norvasc 5mg  QD. Check labs to monitor.  Discussed that adding Norvasc--does not require f/u lab monitoring. She will monitor BP at home and call me with readings in 2 weeks. 12/09/2016---BP well controlled/ at goal. Cont current meds. Check labs to monitor.  - BASIC METABOLIC PANEL WITH GFR - losartan (COZAAR) 100 MG tablet; Take 1 tablet (100 mg total) by mouth daily.  Dispense: 30 tablet; Refill: 5 - hydrochlorothiazide (HYDRODIURIL) 25 MG tablet; Take 1 tablet (25 mg total) by mouth daily.   Dispense: 30 tablet; Refill: 5 - amLODipine (NORVASC) 5 MG tablet; Take 1 tablet (5 mg total) by mouth daily.  Dispense: 30 tablet; Refill: 5   Hyperglycemia OV 06/10/15 I gave and reviewed carbohydrate handout. As well she is restarting exercise as her foot is now healing. We'll check A1c today. If this is stable and can wait to repeat in follow-up in 6 months. 06/17/2016: Recheck labs to monitor - BASIC METABOLIC PANEL WITH GFR - Hemoglobin A1c 12/09/2016---Reviewed that A1C was goo 05/2016--5.7----can wait to monitor  Seasonal allergic rhinitis, unspecified trigger She is to cont Flonase. Add Zyrtec.  Discussed adding Prednisone today as she does have tenderness of maxillary sinuses--but she does not want prednisone  unless absolutely necessary--she is to use flonase, zyrtec daily and if symptoms worsen, call me - cetirizine (ZYRTEC) 10 MG tablet; Take 1 tablet (10 mg total) by mouth daily.  Dispense: 30 tablet; Refill: 11   Screening mammogram, breast exam, pelvic exam--Per GYN-- Dr. Hyacinth [facility]ekerMiller [facility][name] manages this. I will NOT recheck here. Screening Labs: I see in Epic that Dr. Hyacinth MeekerMiller checked TSH, FLP. TSH normal. FLP was good.  Screening Colonoscopy:  Patient reports she had one screening colonoscopy at age 22.--Dr. Buccini  Patient reports that she has had one repeat colonoscopy since then--09/28/2012--says this one was performed secondary to abdominal pain she was experiencing.   Says that Dr. Marjorie SmolderBucini told her that he felt her abdominal pain was secondary to scar tissue secondary to all of her past abdominal surgeries.  Immunizations: Influenza:  Tetanus: She received a Tdap  booster here 01/29/2011 Pneumonia Vaccine: She has no indication to receive pneumonia vaccine until age 61  ---------------Shingrix---Discussed this at OV 12/09/2016----she is to contact her insurance and find out coverage, cost then call our office-----------------------------   Routine  office visit in 6 months or sooner if needed.  Signed, [address]Mary Beth [place], [place], [facility] 12/09/2016 9:13 AM

## 2016-12-10 LAB — BASIC METABOLIC PANEL WITH GFR
BUN: 13 mg/dL (ref 7–25)
CHLORIDE: 101 mmol/L (ref 98–110)
CO2: 25 mmol/L (ref 20–32)
Calcium: 9.5 mg/dL (ref 8.6–10.4)
Creat: 0.62 mg/dL (ref 0.50–0.99)
GFR, Est African American: >89 mL/min (ref >=60)
Glucose, Bld: 111 mg/dL — ABNORMAL HIGH (ref 70–99)
Potassium: 3.8 mmol/L (ref 3.5–5.3)
SODIUM: 139 mmol/L (ref 135–146)

## 2016-12-23 DIAGNOSIS — I8312 Varicose veins of left lower extremity with inflammation: Secondary | ICD-10-CM | POA: Diagnosis not present

## 2016-12-23 DIAGNOSIS — I8311 Varicose veins of right lower extremity with inflammation: Secondary | ICD-10-CM | POA: Diagnosis not present

## 2016-12-29 DIAGNOSIS — I8311 Varicose veins of right lower extremity with inflammation: Secondary | ICD-10-CM | POA: Diagnosis not present

## 2017-01-06 ENCOUNTER — Other Ambulatory Visit: Payer: Self-pay | Admitting: Obstetrics & Gynecology

## 2017-01-06 ENCOUNTER — Other Ambulatory Visit: Payer: Self-pay | Admitting: Physician Assistant

## 2017-01-06 NOTE — Telephone Encounter (Signed)
Medication refill request: Estradiol and Progesterone  Last AEX:  01-23-16  Next AEX: 01-26-17  Last MMG (if hormonal medication request): [id number] WNL  Refill authorized: please advise

## 2017-01-18 DIAGNOSIS — Z23 Encounter for immunization: Secondary | ICD-10-CM | POA: Diagnosis not present

## 2017-01-20 DIAGNOSIS — I8311 Varicose veins of right lower extremity with inflammation: Secondary | ICD-10-CM | POA: Diagnosis not present

## 2017-01-20 DIAGNOSIS — I8312 Varicose veins of left lower extremity with inflammation: Secondary | ICD-10-CM | POA: Diagnosis not present

## 2017-01-25 NOTE — Progress Notes (Signed)
61 y.o. G2P2 Married Caucasian F here for annual exam.  Had to eat this morning due to having a significant sinus headache this morning.  Feels lowing the HRT dosage has not been good for her.  Having a lot more vaginal dryness and having increased issues with sleep.  Would like to go back to her regular dosage.  Denies vaginal bleeding.  Patient's last menstrual period was 04/27/1992.          Sexually active: Yes.    The current method of family planning is status post hysterectomy.   Exercising: Yes.    treadmill, elliptical  Smoker:  no  Health Maintenance: Pap:  10/22/10 wnl  History of abnormal Pap:  no MMG:  06/22/16 BIRADS 1 negative  Colonoscopy:  09/28/12- repeat 10 years, Dr. Matthias Hughs  BMD:   Unsure   TDaP:  01/29/11  Pneumonia vaccine(s):  never Zostavax:   Patient planning to get this week  Hep C testing: 01/23/16 negative  Screening Labs: PCP, Hb today: PCP   reports that she has never smoked. She has never used smokeless tobacco. She reports that she drinks alcohol. She reports that she does not use drugs.  Past Medical History:  Diagnosis Date  . Broken ankle 1/16   will have surgery on 01/30/15-right ankle  . Elevated hemoglobin A1c 01/17/15   6.0  . Endometriosis   . Herpes simplex type 1 infection   . Hypertension   . Migraine    h/o migraines, none since hysterectomy  . Varicose vein    surgery on right leg    Past Surgical History:  Procedure Laterality Date  . ABDOMINAL HYSTERECTOMY  1994   LAVH/RSO  . ANKLE SURGERY Right   . CESAREAN SECTION    . CHOLECYSTECTOMY    . COLON RESECTION  11/06   and lap LSO  . CYSTECTOMY    . HERNIA REPAIR  1982  . HERNIA REPAIR  2003   and bladder repair    Current Outpatient Prescriptions  Medication Sig Dispense Refill  . amLODipine (NORVASC) 5 MG tablet take 1 tablet by mouth once daily 30 tablet 5  . aspirin 81 MG tablet Take 81 mg by mouth daily.    Marland Kitchen CALCIUM PO Take by mouth daily.    . cetirizine (ZYRTEC) 10  MG tablet Take 1 tablet (10 mg total) by mouth daily. 30 tablet 11  . clobetasol ointment (TEMOVATE) 0.05 % Apply 1 application topically as needed.    Marland Kitchen estradiol (ESTRACE) 1 MG tablet take 1 tablet by mouth once daily 90 tablet 0  . fluticasone (FLONASE) 50 MCG/ACT nasal spray Place 2 sprays into both nostrils daily. 16 g 6  . hydrochlorothiazide (HYDRODIURIL) 25 MG tablet take 1 tablet once daily 90 tablet 1  . KRILL OIL PO Take by mouth. Omega red krill oil    . losartan (COZAAR) 100 MG tablet take 1 tablet by mouth once daily 90 tablet 1  . medroxyPROGESTERone (PROVERA) 2.5 MG tablet take 1 tablet by mouth once daily 90 tablet 0  . Red Yeast Rice Extract (RED YEAST RICE PO) Take by mouth daily.    . vitamin C (ASCORBIC ACID) 500 MG tablet Take 500 mg by mouth 2 (two) times daily.    . Vitamin D, Ergocalciferol, (DRISDOL) 50000 units CAPS capsule Take 1 capsule (50,000 Units total) by mouth every 7 (seven) days. 12 capsule 4   No current facility-administered medications for this visit.     Family History  Problem Relation Age of Onset  . Skin cancer Unknown   . Lung cancer Father   . Spina bifida Daughter        and hydrocephalia  . Diabetes Mother   . Hypertension Mother   . Thyroid nodules Mother        being watched  . Diabetes Sister        x 3  . Hypertension Sister   . Thyroid nodules Sister     ROS:  Pertinent items are noted in HPI.  Otherwise, a comprehensive ROS was negative.  Exam:   BP 120/84 (BP Location: Right Arm, Patient Position: Sitting, Cuff Size: Normal)   Pulse 72   Resp 16   Ht  (1.651 m)   Wt 179 lb (81.2 kg)   LMP 04/27/1992   BMI 29.79 kg/m    Height:  (165.1 cm)  Ht Readings from Last 3 Encounters:  01/26/17  (1.651 m)  12/09/16 5' 5.5" (1.664 m)  01/23/16  (1.676 m)    General appearance: alert, cooperative and appears stated age Head: Normocephalic, without obvious abnormality, atraumatic Neck: no adenopathy,  supple, symmetrical, trachea midline and thyroid normal to inspection and palpation Lungs: clear to auscultation bilaterally Breasts: normal appearance, no masses or tenderness Heart: regular rate and rhythm Abdomen: soft, non-tender; bowel sounds normal; no masses,  no organomegaly Extremities: extremities normal, atraumatic, no cyanosis or edema Skin: Skin color, texture, turgor normal. No rashes or lesions Lymph nodes: Cervical, supraclavicular, and axillary nodes normal. No abnormal inguinal nodes palpated Neurologic: Grossly normal   Pelvic: External genitalia:  no lesions              Urethra:  normal appearing urethra with no masses, tenderness or lesions              Bartholins and Skenes: normal                 Vagina: normal appearing vagina with normal color and discharge, no lesions              Cervix: absent              Pap taken: No. Bimanual Exam:  Uterus:  uterus absent              Adnexa: no mass, fullness, tenderness               Rectovaginal: Confirms               Anus:  normal sphincter tone, no lesions  Chaperone was present for exam.  A:  Well Woman with normal exam PMP on HRT H/O LAVH/RSO, then later LSO due to endometriosis Hypertension H/O mildly elevated LDLs H/O elevated HbA1C two years ago H/O Vit D deficiency H/O HSV 1  P:   Mammogram yearly.  Doing 3D MMG. pap smear not obtained Will increase HRT back to her dosage she was on one year ago.  Estradiol 1.0mg  and Provera 2.5mg  daily.  #90/4RF each RF for Vit D 50K every 7 day.  #12/4RF Return for fasting lab work--Vit D, lipids, CBC, CMP, and TSH ordered. Return annually or prn

## 2017-01-26 ENCOUNTER — Ambulatory Visit (INDEPENDENT_AMBULATORY_CARE_PROVIDER_SITE_OTHER): Payer: 59 | Admitting: Obstetrics & Gynecology

## 2017-01-26 ENCOUNTER — Encounter: Payer: Self-pay | Admitting: Obstetrics & Gynecology

## 2017-01-26 VITALS — BP 120/84 | HR 72 | Resp 16 | Ht 65.0 in | Wt 179.0 lb

## 2017-01-26 DIAGNOSIS — Z0000 Encounter for general adult medical examination without abnormal findings: Secondary | ICD-10-CM | POA: Diagnosis not present

## 2017-01-26 DIAGNOSIS — Z01419 Encounter for gynecological examination (general) (routine) without abnormal findings: Secondary | ICD-10-CM

## 2017-01-26 DIAGNOSIS — E2839 Other primary ovarian failure: Secondary | ICD-10-CM | POA: Diagnosis not present

## 2017-01-26 MED ORDER — VITAMIN D (ERGOCALCIFEROL) 1.25 MG (50000 UNIT) PO CAPS: 50000.0000 [IU] | ORAL_CAPSULE | ORAL | 4 refills | Status: DC

## 2017-01-26 MED ORDER — MEDROXYPROGESTERONE ACETATE 2.5 MG PO TABS: 2.5000 mg | ORAL_TABLET | Freq: Every day | ORAL | 4 refills | Status: DC

## 2017-01-26 MED ORDER — ESTRADIOL 1 MG PO TABS: 1.0000 mg | ORAL_TABLET | Freq: Every day | ORAL | 4 refills | Status: DC

## 2017-01-26 NOTE — Patient Instructions (Signed)
Plan to do your bone density with your mammogram next year.  The order has already been placed electronically.

## 2017-01-28 DIAGNOSIS — M19011 Primary osteoarthritis, right shoulder: Secondary | ICD-10-CM | POA: Diagnosis not present

## 2017-01-28 DIAGNOSIS — M25511 Pain in right shoulder: Secondary | ICD-10-CM | POA: Diagnosis not present

## 2017-01-28 DIAGNOSIS — M25711 Osteophyte, right shoulder: Secondary | ICD-10-CM | POA: Diagnosis not present

## 2017-01-28 DIAGNOSIS — G8929 Other chronic pain: Secondary | ICD-10-CM | POA: Diagnosis not present

## 2017-01-29 DIAGNOSIS — Z23 Encounter for immunization: Secondary | ICD-10-CM | POA: Diagnosis not present

## 2017-02-08 DIAGNOSIS — L820 Inflamed seborrheic keratosis: Secondary | ICD-10-CM | POA: Diagnosis not present

## 2017-02-12 DIAGNOSIS — I8311 Varicose veins of right lower extremity with inflammation: Secondary | ICD-10-CM | POA: Diagnosis not present

## 2017-02-15 DIAGNOSIS — I8311 Varicose veins of right lower extremity with inflammation: Secondary | ICD-10-CM | POA: Diagnosis not present

## 2017-03-01 DIAGNOSIS — M19011 Primary osteoarthritis, right shoulder: Secondary | ICD-10-CM | POA: Diagnosis not present

## 2017-03-02 DIAGNOSIS — I8312 Varicose veins of left lower extremity with inflammation: Secondary | ICD-10-CM | POA: Diagnosis not present

## 2017-03-04 DIAGNOSIS — M19011 Primary osteoarthritis, right shoulder: Secondary | ICD-10-CM | POA: Diagnosis not present

## 2017-03-04 DIAGNOSIS — M25611 Stiffness of right shoulder, not elsewhere classified: Secondary | ICD-10-CM | POA: Diagnosis not present

## 2017-03-04 DIAGNOSIS — M25511 Pain in right shoulder: Secondary | ICD-10-CM | POA: Diagnosis not present

## 2017-03-09 DIAGNOSIS — I8311 Varicose veins of right lower extremity with inflammation: Secondary | ICD-10-CM | POA: Diagnosis not present

## 2017-03-10 ENCOUNTER — Other Ambulatory Visit: Payer: Self-pay | Admitting: Orthopedic Surgery

## 2017-03-10 DIAGNOSIS — M7541 Impingement syndrome of right shoulder: Secondary | ICD-10-CM

## 2017-03-10 DIAGNOSIS — M25511 Pain in right shoulder: Secondary | ICD-10-CM

## 2017-03-10 DIAGNOSIS — R531 Weakness: Secondary | ICD-10-CM

## 2017-03-12 DIAGNOSIS — I8311 Varicose veins of right lower extremity with inflammation: Secondary | ICD-10-CM | POA: Diagnosis not present

## 2017-03-20 ENCOUNTER — Ambulatory Visit
Admission: RE | Admit: 2017-03-20 | Discharge: 2017-03-20 | Disposition: A | Discharge status: Home / Self Care | Payer: 59 | Source: Ambulatory Visit | Attending: Orthopedic Surgery | Admitting: Orthopedic Surgery

## 2017-03-20 DIAGNOSIS — M7541 Impingement syndrome of right shoulder: Secondary | ICD-10-CM

## 2017-03-20 DIAGNOSIS — M25511 Pain in right shoulder: Secondary | ICD-10-CM

## 2017-03-20 DIAGNOSIS — R531 Weakness: Secondary | ICD-10-CM

## 2017-03-31 DIAGNOSIS — I8312 Varicose veins of left lower extremity with inflammation: Secondary | ICD-10-CM | POA: Diagnosis not present

## 2017-04-14 DIAGNOSIS — I8311 Varicose veins of right lower extremity with inflammation: Secondary | ICD-10-CM | POA: Diagnosis not present

## 2017-04-27 HISTORY — PX: SHOULDER SURGERY: SHX246

## 2017-05-05 DIAGNOSIS — M25511 Pain in right shoulder: Secondary | ICD-10-CM | POA: Diagnosis not present

## 2017-05-05 DIAGNOSIS — M75121 Complete rotator cuff tear or rupture of right shoulder, not specified as traumatic: Secondary | ICD-10-CM | POA: Diagnosis not present

## 2017-05-05 DIAGNOSIS — M25611 Stiffness of right shoulder, not elsewhere classified: Secondary | ICD-10-CM | POA: Diagnosis not present

## 2017-05-07 ENCOUNTER — Ambulatory Visit: Payer: Commercial Managed Care - HMO | Admitting: Obstetrics & Gynecology

## 2017-05-12 DIAGNOSIS — G8918 Other acute postprocedural pain: Secondary | ICD-10-CM | POA: Diagnosis not present

## 2017-05-12 DIAGNOSIS — M19011 Primary osteoarthritis, right shoulder: Secondary | ICD-10-CM | POA: Diagnosis not present

## 2017-05-12 DIAGNOSIS — M66811 Spontaneous rupture of other tendons, right shoulder: Secondary | ICD-10-CM | POA: Diagnosis not present

## 2017-05-12 DIAGNOSIS — S43422A Sprain of left rotator cuff capsule, initial encounter: Secondary | ICD-10-CM | POA: Diagnosis not present

## 2017-05-12 DIAGNOSIS — M75101 Unspecified rotator cuff tear or rupture of right shoulder, not specified as traumatic: Secondary | ICD-10-CM | POA: Diagnosis not present

## 2017-05-12 DIAGNOSIS — S43431A Superior glenoid labrum lesion of right shoulder, initial encounter: Secondary | ICD-10-CM | POA: Diagnosis not present

## 2017-05-12 DIAGNOSIS — M7541 Impingement syndrome of right shoulder: Secondary | ICD-10-CM | POA: Diagnosis not present

## 2017-05-18 DIAGNOSIS — M25511 Pain in right shoulder: Secondary | ICD-10-CM | POA: Diagnosis not present

## 2017-05-18 DIAGNOSIS — M75121 Complete rotator cuff tear or rupture of right shoulder, not specified as traumatic: Secondary | ICD-10-CM | POA: Diagnosis not present

## 2017-05-18 DIAGNOSIS — M25611 Stiffness of right shoulder, not elsewhere classified: Secondary | ICD-10-CM | POA: Diagnosis not present

## 2017-05-24 DIAGNOSIS — M25511 Pain in right shoulder: Secondary | ICD-10-CM | POA: Diagnosis not present

## 2017-05-24 DIAGNOSIS — R29898 Other symptoms and signs involving the musculoskeletal system: Secondary | ICD-10-CM | POA: Diagnosis not present

## 2017-05-24 DIAGNOSIS — M25611 Stiffness of right shoulder, not elsewhere classified: Secondary | ICD-10-CM | POA: Diagnosis not present

## 2017-05-31 ENCOUNTER — Encounter: Payer: Self-pay | Admitting: Physician Assistant

## 2017-05-31 ENCOUNTER — Other Ambulatory Visit: Payer: Self-pay

## 2017-05-31 ENCOUNTER — Ambulatory Visit: Payer: 59 | Admitting: Physician Assistant

## 2017-05-31 VITALS — BP 120/78 | HR 83 | Temp 98.1°F | Resp 14 | Ht 65.0 in | Wt 185.6 lb

## 2017-05-31 DIAGNOSIS — J029 Acute pharyngitis, unspecified: Secondary | ICD-10-CM | POA: Diagnosis not present

## 2017-05-31 DIAGNOSIS — J988 Other specified respiratory disorders: Secondary | ICD-10-CM

## 2017-05-31 DIAGNOSIS — I10 Essential (primary) hypertension: Secondary | ICD-10-CM

## 2017-05-31 DIAGNOSIS — R739 Hyperglycemia, unspecified: Secondary | ICD-10-CM

## 2017-05-31 DIAGNOSIS — B9789 Other viral agents as the cause of diseases classified elsewhere: Secondary | ICD-10-CM

## 2017-05-31 DIAGNOSIS — R0989 Other specified symptoms and signs involving the circulatory and respiratory systems: Secondary | ICD-10-CM

## 2017-05-31 LAB — INFLUENZA A AND B AG, IMMUNOASSAY
INFLUENZA A ANTIGEN: NOT DETECTED
INFLUENZA B ANTIGEN: NOT DETECTED

## 2017-05-31 NOTE — Progress Notes (Signed)
Patient ID: [name] MRN: [id number], DOB: 12/29/1955, 62 y.o. Date of Encounter: @DATE @  Chief Complaint:  Chief Complaint  Patient presents with  . Sore Throat    x1day  . Nausea  . Chest Pain  . Chills  . Headache    HPI: 62 y.o. year old female  presents with above.  She reports that she has been exposed to people who have been confirmed with positive flu.  Says that her son-in-law tested positive for flu.  States that they live next door and he has been in close proximity to her.  Says that she sat next to friend at church who was then confirmed with flu.  She currently has brace on her right shoulder and is supposed to be starting physical therapy so that was another reason she wanted to get tested and evaluated.  States that her symptoms started late Saturday night which was 05/29/2017.  Says that Sunday morning which was yesterday she had a really bad sore throat.  Also developed some runny nose "just felt bad ".  This morning she has had decreased appetite and a little nausea.  Minimal cough.  States that today her throat is not as bad but it's still a little bit sore and a little raw.  There was had some headache the last 2 days.  Had no known fevers.   She also reports that she is due for follow-up visit regarding her blood pressure and is wanting to address that while she is here today.  Reports that she was feeling lightheaded at times and weaned herself off of Norvasc.  Since stopping the Norvasc she has had no more lightheadedness.  She brings in blood pressure log form.  She has documented the date when she weaned off of the amlodipine.   Stopped amlodipine 03/27/17 is when she completely stopped it.  Has 15 blood pressure readings since that date.  All of them range around 127/84 -- 131/85. Is taking the losartan and the HCTZ as directed and these are causing no adverse effects and since taking just these 2 medicines she is getting that blood pressure readings 127/84  --   131/85.  For further details of her medical history ---see her full note 12/09/2016.   Past Medical History:  Diagnosis Date  . Broken ankle 1/16   will have surgery on 01/30/15-right ankle  . Elevated hemoglobin A1c 01/17/15   6.0  . Endometriosis   . Herpes simplex type 1 infection   . Hypertension   . Migraine    h/o migraines, none since hysterectomy  . Varicose vein    surgery on right leg     Home Meds: Outpatient Medications Prior to Visit  Medication Sig Dispense Refill  . aspirin 81 MG tablet Take 81 mg by mouth daily.    Marland Kitchen CALCIUM PO Take by mouth daily.    Marland Kitchen estradiol (ESTRACE) 1 MG tablet Take 1 tablet (1 mg total) by mouth daily. 90 tablet 4  . fluticasone (FLONASE) 50 MCG/ACT nasal spray Place 2 sprays into both nostrils daily. 16 g 6  . hydrochlorothiazide (HYDRODIURIL) 25 MG tablet take 1 tablet once daily 90 tablet 1  . KRILL OIL PO Take by mouth. Omega red krill oil    . losartan (COZAAR) 100 MG tablet take 1 tablet by mouth once daily 90 tablet 1  . medroxyPROGESTERone (PROVERA) 2.5 MG tablet Take 1 tablet (2.5 mg total) by mouth daily. 90 tablet 4  .  Red Yeast Rice Extract (RED YEAST RICE PO) Take by mouth daily.    . vitamin C (ASCORBIC ACID) 500 MG tablet Take 500 mg by mouth 2 (two) times daily.    . cetirizine (ZYRTEC) 10 MG tablet Take 1 tablet (10 mg total) by mouth daily. (Patient not taking: Reported on 05/31/2017) 30 tablet 11  . clobetasol ointment (TEMOVATE) 0.05 % Apply 1 application topically as needed.    . Vitamin D, Ergocalciferol, (DRISDOL) 50000 units CAPS capsule Take 1 capsule (50,000 Units total) by mouth every 7 (seven) days. (Patient not taking: Reported on 05/31/2017) 12 capsule 4  . amLODipine (NORVASC) 5 MG tablet take 1 tablet by mouth once daily (Patient not taking: Reported on 05/31/2017) 30 tablet 5   No facility-administered medications prior to visit.     Allergies:  Allergies  Allergen Reactions  . Betadine [Povidone Iodine]  Itching  . Codeine   . Other     Band aids-itchy, red rash    Social History   Socioeconomic History  . Marital status: Married    Spouse name: Not on file  . Number of children: Not on file  . Years of education: Not on file  . Highest education level: Not on file  Social Needs  . Financial resource strain: Not on file  . Food insecurity - worry: Not on file  . Food insecurity - inability: Not on file  . Transportation needs - medical: Not on file  . Transportation needs - non-medical: Not on file  Occupational History  . Not on file  Tobacco Use  . Smoking status: Never Smoker  . Smokeless tobacco: Never Used  Substance and Sexual Activity  . Alcohol use: Yes    Comment: a glass of wine a couple of times a week  . Drug use: No  . Sexual activity: Yes    Partners: Male    Birth control/protection: Surgical    Comment: LAVH/RSO, then LSO  Other Topics Concern  . Not on file  Social History Narrative  . Not on file    Family History  Problem Relation Age of Onset  . Skin cancer Unknown   . Lung cancer Father   . Spina bifida Daughter        and hydrocephalia  . Diabetes Mother   . Hypertension Mother   . Thyroid nodules Mother        being watched  . Diabetes Sister        x 3  . Hypertension Sister   . Thyroid nodules Sister      Review of Systems:  See HPI for pertinent ROS. All other ROS negative.    Physical Exam: Blood pressure 120/78, pulse 83, temperature 98.1 F (36.7 C), temperature source Oral, resp. rate 14, height 5\' 5"  (1.651 m), weight 84.2 kg (185 lb 9.6 oz), last menstrual period 04/27/1992, SpO2 98 %., Body mass index is 30.89 kg/m. General: WF. Appears in no acute distress. Head: Normocephalic, atraumatic, eyes without discharge, sclera non-icteric, nares are without discharge. Bilateral auditory canals clear, TM's are without perforation, pearly grey and translucent with reflective cone of light bilaterally. Oral cavity moist, posterior  pharynx mild erythema.  No exudate, no peritonsillar abscess. Neck: Supple. No thyromegaly. No lymphadenopathy. Lungs: Clear bilaterally to auscultation without wheezes, rales, or rhonchi. Breathing is unlabored. Heart: RRR with S1 S2. No murmurs, rubs, or gallops. Musculoskeletal:  Strength and tone normal for age. Extremities/Skin: Warm and dry. No edema.  Neuro: Alert and  oriented X 3. Moves all extremities spontaneously. Gait is normal. CNII-XII grossly in tact. Psych:  Responds to questions appropriately with a normal affect.   Results for orders placed or performed in visit on 05/31/17  STREP GROUP A AG, W/REFLEX TO CULT  Result Value Ref Range   Streptococcus, Group A Screen (Direct) NONE DETECTED   Influenza A and B Ag, Immunoassay  Result Value Ref Range   Source: NASAL    INFLUENZA A ANTIGEN NOT DETECTED NOT DETECT   INFLUENZA B ANTIGEN NOT DETECTED NOT DETECT   COMMENT:      ASSESSMENT AND PLAN:  62 y.o. year old female with  1. Sore throat - STREP GROUP A AG, W/REFLEX TO CULT  2. Chest congestion - Influenza A and B Ag, Immunoassay  3. Viral respiratory infection Discussed that strep and flu test are negative.  Discussed that she has no fever temperature 98.1.  Minimal symptoms.  Doubt flu.  Strep test is negative and clinically not consistent with strep either.  Can use over-the-counter medications for symptom relief.  Follow-up if symptoms worsen significantly or do not resolve within 7-10 days.  Or if develops fever greater than 100.  4. Essential hypertension I have removed Norvasc off of her medication list.  Blood pressure is stable and controlled on current meds.  Continue current meds the same.  Check lab to monitor. - BASIC METABOLIC PANEL WITH GFR  5. Hyperglycemia Reviewed Mrs. Dawood his last visit with me 12/09/16.  The past when her gynecologist Dr. Hyacinth Meeker had checked labs glucose was slightly high and A1c was added and was 6.0 at that time back 01/17/15.  He  has had follow-up A1c to monitor.  Was last checked 1 year ago.  05/2016 A1c was 5.7.  Recheck now to monitor. - BASIC METABOLIC PANEL WITH GFR - Hemoglobin A1c   For further details of her medical history ---see her full note 12/09/2016.   Signed, [address] [place], [place], BSFM 05/31/2017 10:00 AM

## 2017-06-01 LAB — HEMOGLOBIN A1C
HEMOGLOBIN A1C: 5.9 %{Hb} — AB (ref <5.7)
Mean Plasma Glucose: 123 (calc)
eAG (mmol/L): 6.8 (calc)

## 2017-06-01 LAB — BASIC METABOLIC PANEL WITH GFR
BUN: 12 mg/dL (ref 7–25)
CALCIUM: 9.6 mg/dL (ref 8.6–10.4)
CO2: 29 mmol/L (ref 20–32)
Chloride: 103 mmol/L (ref 98–110)
Creat: 0.55 mg/dL (ref 0.50–0.99)
GFR, EST NON AFRICAN AMERICAN: 102 mL/min/{1.73_m2} (ref >=60)
GFR, Est African American: 118 mL/min/{1.73_m2} (ref >=60)
Glucose, Bld: 99 mg/dL (ref 65–99)
POTASSIUM: 3.5 mmol/L (ref 3.5–5.3)
Sodium: 140 mmol/L (ref 135–146)

## 2017-06-02 LAB — STREP GROUP A AG, W/REFLEX TO CULT: Streptococcus, Group A Screen (Direct): NOT DETECTED

## 2017-06-02 LAB — CULTURE, GROUP A STREP
MICRO NUMBER: 90146786
SPECIMEN QUALITY:: ADEQUATE

## 2017-06-07 DIAGNOSIS — M25611 Stiffness of right shoulder, not elsewhere classified: Secondary | ICD-10-CM | POA: Diagnosis not present

## 2017-06-07 DIAGNOSIS — M75121 Complete rotator cuff tear or rupture of right shoulder, not specified as traumatic: Secondary | ICD-10-CM | POA: Diagnosis not present

## 2017-06-07 DIAGNOSIS — M25511 Pain in right shoulder: Secondary | ICD-10-CM | POA: Diagnosis not present

## 2017-06-14 ENCOUNTER — Ambulatory Visit: Payer: 59 | Admitting: Physician Assistant

## 2017-06-15 DIAGNOSIS — M25511 Pain in right shoulder: Secondary | ICD-10-CM | POA: Diagnosis not present

## 2017-06-15 DIAGNOSIS — M25611 Stiffness of right shoulder, not elsewhere classified: Secondary | ICD-10-CM | POA: Diagnosis not present

## 2017-06-15 DIAGNOSIS — R29898 Other symptoms and signs involving the musculoskeletal system: Secondary | ICD-10-CM | POA: Diagnosis not present

## 2017-06-23 DIAGNOSIS — M25511 Pain in right shoulder: Secondary | ICD-10-CM | POA: Diagnosis not present

## 2017-06-23 DIAGNOSIS — M75121 Complete rotator cuff tear or rupture of right shoulder, not specified as traumatic: Secondary | ICD-10-CM | POA: Diagnosis not present

## 2017-06-23 DIAGNOSIS — M25611 Stiffness of right shoulder, not elsewhere classified: Secondary | ICD-10-CM | POA: Diagnosis not present

## 2017-06-28 DIAGNOSIS — M25511 Pain in right shoulder: Secondary | ICD-10-CM | POA: Diagnosis not present

## 2017-06-28 DIAGNOSIS — M25611 Stiffness of right shoulder, not elsewhere classified: Secondary | ICD-10-CM | POA: Diagnosis not present

## 2017-06-28 DIAGNOSIS — R29898 Other symptoms and signs involving the musculoskeletal system: Secondary | ICD-10-CM | POA: Diagnosis not present

## 2017-07-05 DIAGNOSIS — M25611 Stiffness of right shoulder, not elsewhere classified: Secondary | ICD-10-CM | POA: Diagnosis not present

## 2017-07-05 DIAGNOSIS — R29898 Other symptoms and signs involving the musculoskeletal system: Secondary | ICD-10-CM | POA: Diagnosis not present

## 2017-07-05 DIAGNOSIS — M25511 Pain in right shoulder: Secondary | ICD-10-CM | POA: Diagnosis not present

## 2017-07-06 DIAGNOSIS — I8312 Varicose veins of left lower extremity with inflammation: Secondary | ICD-10-CM | POA: Diagnosis not present

## 2017-07-08 ENCOUNTER — Other Ambulatory Visit: Payer: Self-pay | Admitting: Physician Assistant

## 2017-07-12 DIAGNOSIS — M25511 Pain in right shoulder: Secondary | ICD-10-CM | POA: Diagnosis not present

## 2017-07-12 DIAGNOSIS — R29898 Other symptoms and signs involving the musculoskeletal system: Secondary | ICD-10-CM | POA: Diagnosis not present

## 2017-07-12 DIAGNOSIS — M25611 Stiffness of right shoulder, not elsewhere classified: Secondary | ICD-10-CM | POA: Diagnosis not present

## 2017-07-20 DIAGNOSIS — M25611 Stiffness of right shoulder, not elsewhere classified: Secondary | ICD-10-CM | POA: Diagnosis not present

## 2017-07-20 DIAGNOSIS — I8312 Varicose veins of left lower extremity with inflammation: Secondary | ICD-10-CM | POA: Diagnosis not present

## 2017-07-20 DIAGNOSIS — R29898 Other symptoms and signs involving the musculoskeletal system: Secondary | ICD-10-CM | POA: Diagnosis not present

## 2017-07-20 DIAGNOSIS — M25511 Pain in right shoulder: Secondary | ICD-10-CM | POA: Diagnosis not present

## 2017-07-23 DIAGNOSIS — Z23 Encounter for immunization: Secondary | ICD-10-CM | POA: Diagnosis not present

## 2017-07-26 DIAGNOSIS — R29898 Other symptoms and signs involving the musculoskeletal system: Secondary | ICD-10-CM | POA: Diagnosis not present

## 2017-07-26 DIAGNOSIS — M25611 Stiffness of right shoulder, not elsewhere classified: Secondary | ICD-10-CM | POA: Diagnosis not present

## 2017-07-26 DIAGNOSIS — M25511 Pain in right shoulder: Secondary | ICD-10-CM | POA: Diagnosis not present

## 2017-07-28 DIAGNOSIS — L820 Inflamed seborrheic keratosis: Secondary | ICD-10-CM | POA: Diagnosis not present

## 2017-07-28 DIAGNOSIS — L821 Other seborrheic keratosis: Secondary | ICD-10-CM | POA: Diagnosis not present

## 2017-07-28 DIAGNOSIS — B078 Other viral warts: Secondary | ICD-10-CM | POA: Diagnosis not present

## 2017-08-03 ENCOUNTER — Other Ambulatory Visit: Payer: Self-pay | Admitting: Physician Assistant

## 2017-08-03 DIAGNOSIS — R29898 Other symptoms and signs involving the musculoskeletal system: Secondary | ICD-10-CM | POA: Diagnosis not present

## 2017-08-03 DIAGNOSIS — M25611 Stiffness of right shoulder, not elsewhere classified: Secondary | ICD-10-CM | POA: Diagnosis not present

## 2017-08-03 DIAGNOSIS — M75121 Complete rotator cuff tear or rupture of right shoulder, not specified as traumatic: Secondary | ICD-10-CM | POA: Diagnosis not present

## 2017-08-06 ENCOUNTER — Other Ambulatory Visit: Payer: Self-pay | Admitting: Physician Assistant

## 2017-08-06 ENCOUNTER — Other Ambulatory Visit: Payer: Self-pay | Admitting: Obstetrics & Gynecology

## 2017-08-06 NOTE — Telephone Encounter (Signed)
Received refill request for Estradiol 1mg  #90 from [facility] [place]--they [facility] to [facility]. [facility] and need [facility].  Medication refill request: Estradiol 1mg  #90 Last AEX:  01-26-17 Next AEX: 04-01-18 Last MMG (if hormonal medication request): 06-22-16 [id number] Refill authorized: please advise

## 2017-08-09 DIAGNOSIS — R29898 Other symptoms and signs involving the musculoskeletal system: Secondary | ICD-10-CM | POA: Diagnosis not present

## 2017-08-09 DIAGNOSIS — M25511 Pain in right shoulder: Secondary | ICD-10-CM | POA: Diagnosis not present

## 2017-08-09 DIAGNOSIS — M25611 Stiffness of right shoulder, not elsewhere classified: Secondary | ICD-10-CM | POA: Diagnosis not present

## 2017-08-10 DIAGNOSIS — I8312 Varicose veins of left lower extremity with inflammation: Secondary | ICD-10-CM | POA: Diagnosis not present

## 2017-08-16 DIAGNOSIS — M25511 Pain in right shoulder: Secondary | ICD-10-CM | POA: Diagnosis not present

## 2017-08-16 DIAGNOSIS — R29898 Other symptoms and signs involving the musculoskeletal system: Secondary | ICD-10-CM | POA: Diagnosis not present

## 2017-08-16 DIAGNOSIS — M25611 Stiffness of right shoulder, not elsewhere classified: Secondary | ICD-10-CM | POA: Diagnosis not present

## 2017-08-23 DIAGNOSIS — M25611 Stiffness of right shoulder, not elsewhere classified: Secondary | ICD-10-CM | POA: Diagnosis not present

## 2017-08-23 DIAGNOSIS — M25511 Pain in right shoulder: Secondary | ICD-10-CM | POA: Diagnosis not present

## 2017-08-23 DIAGNOSIS — R29898 Other symptoms and signs involving the musculoskeletal system: Secondary | ICD-10-CM | POA: Diagnosis not present

## 2017-08-23 DIAGNOSIS — I8311 Varicose veins of right lower extremity with inflammation: Secondary | ICD-10-CM | POA: Diagnosis not present

## 2017-08-27 ENCOUNTER — Encounter: Payer: Self-pay | Admitting: Podiatry

## 2017-08-27 ENCOUNTER — Ambulatory Visit (INDEPENDENT_AMBULATORY_CARE_PROVIDER_SITE_OTHER): Payer: 59

## 2017-08-27 ENCOUNTER — Ambulatory Visit: Payer: 59 | Admitting: Podiatry

## 2017-08-27 ENCOUNTER — Ambulatory Visit: Payer: 59

## 2017-08-27 DIAGNOSIS — M7732 Calcaneal spur, left foot: Secondary | ICD-10-CM

## 2017-08-27 DIAGNOSIS — M7752 Other enthesopathy of left foot: Secondary | ICD-10-CM

## 2017-08-27 DIAGNOSIS — M779 Enthesopathy, unspecified: Secondary | ICD-10-CM

## 2017-08-27 MED ORDER — MELOXICAM 15 MG PO TABS: 15.0000 mg | ORAL_TABLET | Freq: Every day | ORAL | 0 refills | Status: DC

## 2017-08-27 NOTE — Progress Notes (Signed)
Subjective:    Patient ID: [name], female    DOB: 12-30-1955, 62 y.o.   MRN: [id number]  HPI 62 year old female presents the office today with concerns of left heel pain.  She is in the right is doing very well she is very happy with the outcome of the surgery in the right side.  Over the last 1.5 months she started noticed gradual onset of pain to the left heel. She states that she has difficulty walking down steps.  She denies any recent injury or trauma.  She is currently in the process of having vein therapy.  She describes an aching sensation.  She denies any recent treatment otherwise.  She has no other concerns.   Review of Systems  All other systems reviewed and are negative.  Past Medical History:  Diagnosis Date  . Broken ankle 1/16   will have surgery on 01/30/15-right ankle  . Elevated hemoglobin A1c 01/17/15   6.0  . Endometriosis   . Herpes simplex type 1 infection   . Hypertension   . Migraine    h/o migraines, none since hysterectomy  . Varicose vein    surgery on right leg    Past Surgical History:  Procedure Laterality Date  . ABDOMINAL HYSTERECTOMY  1994   LAVH/RSO  . ANKLE SURGERY Right   . CESAREAN SECTION    . CHOLECYSTECTOMY    . COLON RESECTION  11/06   and lap LSO  . CYSTECTOMY    . HERNIA REPAIR  1982  . HERNIA REPAIR  2003   and bladder repair     Current Outpatient Medications:  .  aspirin 81 MG tablet, Take 81 mg by mouth daily., Disp: , Rfl:  .  CALCIUM PO, Take by mouth daily., Disp: , Rfl:  .  cetirizine (ZYRTEC) 10 MG tablet, Take 1 tablet (10 mg total) by mouth daily. (Patient not taking: Reported on 05/31/2017), Disp: 30 tablet, Rfl: 11 .  clobetasol ointment (TEMOVATE) 0.05 %, Apply 1 application topically as needed., Disp: , Rfl:  .  estradiol (ESTRACE) 1 MG tablet, TAKE 1 TABLET BY MOUTH ONCE DAILY, Disp: 90 tablet, Rfl: 4 .  fluticasone (FLONASE) 50 MCG/ACT nasal spray, Place 2 sprays into both nostrils daily., Disp: 16 g,  Rfl: 6 .  hydrochlorothiazide (HYDRODIURIL) 25 MG tablet, TAKE 1 TABLET BY MOUTH ONCE DAILY, Disp: 90 tablet, Rfl: 0 .  KRILL OIL PO, Take by mouth. Omega red krill oil, Disp: , Rfl:  .  losartan (COZAAR) 100 MG tablet, TAKE 1 TABLET BY MOUTH ONCE DAILY, Disp: 30 tablet, Rfl: 0 .  medroxyPROGESTERone (PROVERA) 2.5 MG tablet, Take 1 tablet (2.5 mg total) by mouth daily., Disp: 90 tablet, Rfl: 4 .  meloxicam (MOBIC) 15 MG tablet, Take 1 tablet (15 mg total) by mouth daily., Disp: 30 tablet, Rfl: 0 .  Red Yeast Rice Extract (RED YEAST RICE PO), Take by mouth daily., Disp: , Rfl:  .  vitamin C (ASCORBIC ACID) 500 MG tablet, Take 500 mg by mouth 2 (two) times daily., Disp: , Rfl:  .  Vitamin D, Ergocalciferol, (DRISDOL) 50000 units CAPS capsule, Take 1 capsule (50,000 Units total) by mouth every 7 (seven) days. (Patient not taking: Reported on 05/31/2017), Disp: 12 capsule, Rfl: 4  Allergies  Allergen Reactions  . Betadine [Povidone Iodine] Itching  . Codeine   . Other     Band aids-itchy, red rash    Social History   Socioeconomic History  . Marital  status: Married    Spouse name: Not on file  . Number of children: Not on file  . Years of education: Not on file  . Highest education level: Not on file  Occupational History  . Not on file  Social Needs  . Financial resource strain: Not on file  . Food insecurity:    Worry: Not on file    Inability: Not on file  . Transportation needs:    Medical: Not on file    Non-medical: Not on file  Tobacco Use  . Smoking status: Never Smoker  . Smokeless tobacco: Never Used  Substance and Sexual Activity  . Alcohol use: Yes    Comment: a glass of wine a couple of times a week  . Drug use: No  . Sexual activity: Yes    Partners: Male    Birth control/protection: Surgical    Comment: LAVH/RSO, then LSO  Lifestyle  . Physical activity:    Days per week: Not on file    Minutes per session: Not on file  . Stress: Not on file  Relationships   . Social connections:    Talks on phone: Not on file    Gets together: Not on file    Attends religious service: Not on file    Active member of club or organization: Not on file    Attends meetings of clubs or organizations: Not on file    Relationship status: Not on file  . Intimate partner violence:    Fear of current or ex partner: Not on file    Emotionally abused: Not on file    Physically abused: Not on file    Forced sexual activity: Not on file  Other Topics Concern  . Not on file  Social History Narrative  . Not on file        Objective:   Physical Exam  General: AAO x3, NAD  Dermatological: Skin is warm, dry and supple bilateral. Nails x 10 are well manicured; remaining integument appears unremarkable at this time. There are no open sores, no preulcerative lesions, no rash or signs of infection present.  Vascular: Dorsalis Pedis artery and Posterior Tibial artery pedal pulses are 2/4 bilateral with immedate capillary fill time. Pedal hair growth present.There is no pain with calf compression, swelling, warmth, erythema.   Neruologic: Grossly intact via light touch bilateral. Vibratory intact via tuning fork bilateral. Protective threshold with Semmes Wienstein monofilament intact to all pedal sites bilateral.  Negative Tinel sign  Musculoskeletal: There is tenderness palpation of the posterior aspect of the calcaneus along the insertion of the Achilles tendon and small prominent retrocalcaneal bone spurs palpable.  There is no significant edema there is no erythema or increase in warmth identified today.  Thompson test is negative.  Achilles tendon appears to be intact.  There is no pain with lateral compression of calcaneus or other areas of the foot.  Muscular strength 5/5 in all groups tested bilateral.  Gait: Unassisted, Nonantalgic.      Assessment & Plan:  62 year old female with left insertional Achilles tendinitis, heel spur -Treatment options discussed  including all alternatives, risks, and complications -Etiology of symptoms were discussed -X-rays were obtained and reviewed with the patient. Small heel spur present.  There is no evidence of acute fracture or stress fracture identified today.   -Prescribed mobic. Discussed side effects of the medication and directed to stop if any are to occur and call the office.  -Night splint -Gel offloading patient was  dispensed. -She is undergoing physical therapy for the shoulder and we discussed that we can do this for the foot as well.  She is a specific order for this I can write it for her but discussed with her to talk to her physical therapist about this.  Also continue stretching, icing exercises at home daily.  Vivi Barrack DPM

## 2017-08-27 NOTE — Patient Instructions (Signed)

## 2017-08-29 DIAGNOSIS — M779 Enthesopathy, unspecified: Secondary | ICD-10-CM | POA: Insufficient documentation

## 2017-08-31 DIAGNOSIS — M25611 Stiffness of right shoulder, not elsewhere classified: Secondary | ICD-10-CM | POA: Diagnosis not present

## 2017-08-31 DIAGNOSIS — R29898 Other symptoms and signs involving the musculoskeletal system: Secondary | ICD-10-CM | POA: Diagnosis not present

## 2017-08-31 DIAGNOSIS — M25511 Pain in right shoulder: Secondary | ICD-10-CM | POA: Diagnosis not present

## 2017-09-01 ENCOUNTER — Telehealth: Payer: Self-pay

## 2017-09-01 NOTE — Telephone Encounter (Signed)
Recommend that she replace the losartan 100 mg with benazepril 20 mg. Can send prescription for benazepril 20 mg 1 p.o. daily #30+0. Have her come in for office visit in 2 weeks for [place] to recheck BP and lab after med change.

## 2017-09-01 NOTE — Telephone Encounter (Signed)
Patient called and left a message regarding her blood pressure medicine losartan.Patient states since there has been so many recalls on the medication that she would like to switch to another blood pressure medicine. Pls advise

## 2017-09-02 MED ORDER — BENAZEPRIL HCL 20 MG PO TABS: 20.0000 mg | ORAL_TABLET | Freq: Every day | ORAL | 0 refills | Status: DC

## 2017-09-02 NOTE — Telephone Encounter (Signed)
Change has been made patient is aware and appointment has been scheduled for 5/23  am

## 2017-09-06 DIAGNOSIS — M7981 Nontraumatic hematoma of soft tissue: Secondary | ICD-10-CM | POA: Diagnosis not present

## 2017-09-06 DIAGNOSIS — I8312 Varicose veins of left lower extremity with inflammation: Secondary | ICD-10-CM | POA: Diagnosis not present

## 2017-09-13 DIAGNOSIS — R29898 Other symptoms and signs involving the musculoskeletal system: Secondary | ICD-10-CM | POA: Diagnosis not present

## 2017-09-13 DIAGNOSIS — M25511 Pain in right shoulder: Secondary | ICD-10-CM | POA: Diagnosis not present

## 2017-09-13 DIAGNOSIS — M25611 Stiffness of right shoulder, not elsewhere classified: Secondary | ICD-10-CM | POA: Diagnosis not present

## 2017-09-16 ENCOUNTER — Ambulatory Visit: Payer: Self-pay | Admitting: Physician Assistant

## 2017-09-21 ENCOUNTER — Other Ambulatory Visit: Payer: Self-pay | Admitting: Obstetrics & Gynecology

## 2017-09-21 DIAGNOSIS — R6889 Other general symptoms and signs: Secondary | ICD-10-CM | POA: Diagnosis not present

## 2017-09-21 DIAGNOSIS — I8311 Varicose veins of right lower extremity with inflammation: Secondary | ICD-10-CM | POA: Diagnosis not present

## 2017-09-21 DIAGNOSIS — R29898 Other symptoms and signs involving the musculoskeletal system: Secondary | ICD-10-CM | POA: Diagnosis not present

## 2017-09-21 DIAGNOSIS — M25611 Stiffness of right shoulder, not elsewhere classified: Secondary | ICD-10-CM | POA: Diagnosis not present

## 2017-09-21 DIAGNOSIS — Z1231 Encounter for screening mammogram for malignant neoplasm of breast: Secondary | ICD-10-CM

## 2017-09-23 ENCOUNTER — Encounter: Payer: Self-pay | Admitting: Physician Assistant

## 2017-09-23 ENCOUNTER — Ambulatory Visit: Payer: 59 | Admitting: Physician Assistant

## 2017-09-23 VITALS — BP 136/88 | HR 81 | Temp 98.3°F | Resp 14 | Ht 65.0 in | Wt 182.2 lb

## 2017-09-23 DIAGNOSIS — I10 Essential (primary) hypertension: Secondary | ICD-10-CM | POA: Diagnosis not present

## 2017-09-23 NOTE — Progress Notes (Signed)
Patient ID: [name] MRN: [id number], DOB: November 08, 1955, 62 y.o. Date of Encounter: 09/23/2017, 2:22 PM    Chief Complaint:  Chief Complaint  Patient presents with  . follow up with blood pressure     HPI: 62 y.o. year old white female followup hypertension.    From Prior OV Notes:  She is taking both blood pressure medications as directed. She is on Cozaar 100 mg daily and HCTZ 25 mg daily.    Still sees Dr. Edwinna Areola routinely for GYN. (I told her that I know Dr. 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 Sabra Heck is managing her Vit D Deficiency.   Also reviewed that Dr. Sabra Heck did full panel of routine labs 01/17/15. Glucose was slightly high so A1c was added and was 6.0. Result note stated for primary care to follow-up. Will follow this up today.   At prior OV  she said  that they have a gym in their house. Some days she would do the elliptical for 2 or 3 miles. Other days she did kick boxing video and weights. She did  exercise 3-5 days per week.  At visit 06/10/15 she had recently had surgery to her ankle/foot. Therefore her exercise has been decreased. At visit 12/11/15 she reports that she is doing elliptical 4-5 days a week. Is that she is doing a lot of elliptical and treadmill just can't do a lot of high impact exercise.  As well she stated that she has an adult handicapped daughter who lives with them.   At visit 12/11/15 she brings in a log where she has been documenting blood pressure readings. Her readings are as follows: 126/88, 108/76, 124/83, 123/84, 123/85, 125/80, 118/83, 132/87, 129/87, 131/89.   At [place] 06/17/2106--- She brings some blood pressure readings -- 132/93, 138/89, 133/92, 140/88, 131/88, 125/89.  She is taking the losartan 100 mg daily and HCTZ 25 mg daily.  She is exercising almost every day.  She is having no LE edema.  She has seen on the news that they have changed the  guidelines--lowered goal for BP She has no complaints or concerns today.  At OV 06/17/2016---added Norvasc 48m QD for improved BP control.    12/09/2016: Today she reports that she did add the Norvasc 547mdaily. She is taking this in addition to other BP meds (losartan 100 mg daily and HCTZ 25 mg daily.)--as directed. She is having no Le edema, having no other adverse effects.  She brings in BP log---getting very consistent readings--- all are in same range-- ~ 118/81, 115/ 78, 126/79, 124/76.  She reports that every spring and fall she has problems with allergies and these symptoms just started in past few days. Using otc Flonase. Feels tenderness in bilateral maxillary sinuses and has clear rhinorrhea.  She continues to have routine OVs with Dr. MiSabra HeckGyn and she continues to manage her VIt D Def in addition ot other Gyn needs.  She has no other concerns today.   05/31/2017:  05/31/2017 initially was for evaluation of sore throat etc. However at that visit she also reported that she was due for follow-up visit regarding blood pressure and was wanting address that while she was here that day.  Ported that she was feeling lightheaded at times and weaned herself off of Norvasc.  Since stopping the Norvasc she had no more lightheadedness.  At that visit she brought  in blood sugar log form.  Documented to date when she weaned off of the amlodipine.  Stopped amlodipine 03/27/2017 and that is when she completely stopped it.  She had 15 blood sugar pressure readings since that date.  All of them are in the range of 127/84-130 1/85.  At the time of that visit she was currently taking losartan and HCTZ as directed and those were causing no adverse effects and since taking just those 2 medications was getting good blood pressure readings.  For at that visit kept her off of the Norvasc and continue just the losartan HCT.  09/01/2017: She had a phone note on this date.  Called and left a message regarding blood  pressure medication losartan.  She reported that since there is been some any recalls on that medication she wanted to switch to another blood pressure medication. That time we planned for her to replace that with benazepril 20 mg.  Minute for her to come in for office visit in 2 weeks for [place] to check BP and lab to follow-up med change.  09/23/2017: Today she reports that she did make that medication change and is now taking the benazepril 20 mg daily.  Also has continued taking the HCTZ.  Reports that she is feeling well and having no side effects since medication change.  No lightheadedness.  Brings in blood pressure log form and the readings since this medication change have been the following: 119/81, 125/81, 133/85, 113/79, 139/80, 114/74. She has no other concerns to address today.   Home Meds:   Outpatient Medications Prior to Visit  Medication Sig Dispense Refill  . aspirin 81 MG tablet Take 81 mg by mouth daily.    . benazepril (LOTENSIN) 20 MG tablet Take 1 tablet (20 mg total) by mouth daily. 30 tablet 0  . CALCIUM PO Take by mouth daily.    Marland Kitchen estradiol (ESTRACE) 1 MG tablet TAKE 1 TABLET BY MOUTH ONCE DAILY 90 tablet 4  . fluticasone (FLONASE) 50 MCG/ACT nasal spray Place 2 sprays into both nostrils daily. 16 g 6  . hydrochlorothiazide (HYDRODIURIL) 25 MG tablet TAKE 1 TABLET BY MOUTH ONCE DAILY 90 tablet 0  . KRILL OIL PO Take by mouth. Omega red krill oil    . medroxyPROGESTERone (PROVERA) 2.5 MG tablet Take 1 tablet (2.5 mg total) by mouth daily. 90 tablet 4  . meloxicam (MOBIC) 15 MG tablet Take 1 tablet (15 mg total) by mouth daily. 30 tablet 0  . Red Yeast Rice Extract (RED YEAST RICE PO) Take by mouth daily.    . vitamin C (ASCORBIC ACID) 500 MG tablet Take 500 mg by mouth 2 (two) times daily.    . cetirizine (ZYRTEC) 10 MG tablet Take 1 tablet (10 mg total) by mouth daily. (Patient not taking: Reported on 05/31/2017) 30 tablet 11  . clobetasol ointment (TEMOVATE) 0.86 %  Apply 1 application topically as needed.    . Vitamin D, Ergocalciferol, (DRISDOL) 50000 units CAPS capsule Take 1 capsule (50,000 Units total) by mouth every 7 (seven) days. (Patient not taking: Reported on 05/31/2017) 12 capsule 4   No facility-administered medications prior to visit.      Allergies:  Allergies  Allergen Reactions  . Betadine [Povidone Iodine] Itching  . Codeine   . Other     Band aids-itchy, red rash      Review of Systems: See HPI for pertinent ROS. All other ROS negative.    Physical Exam: Blood pressure 136/88, pulse  81, temperature 98.3 F (36.8 C), temperature source Oral, resp. rate 14, height _0  (1.651 m), weight 82.6 kg (182 lb 3.2 oz), last menstrual period 04/27/1992, SpO2 97 %., Body mass index is 30.32 kg/m. General: WNWD WF. Appears in no acute distress. Neck: Supple. No thyromegaly. No lymphadenopathy. Lungs: Clear bilaterally to auscultation without wheezes, rales, or rhonchi. Breathing is unlabored. Heart: RRR with S1 S2. No murmurs, rubs, or gallops. Musculoskeletal:  Strength and tone normal for age. Extremities/Skin: Warm and dry.  No edema.  Neuro: Alert and oriented X 3. Moves all extremities spontaneously. Gait is normal. CNII-XII grossly in tact. Psych:  Responds to questions appropriately with a normal affect.     ASSESSMENT AND PLAN:  62 y.o. year old female with   Hypertension 09/23/2017: Blood Pressure is well controlled.  Continue current medications.  She is now on benazepril 20 mg daily and HCTZ.  25 mg daily.  Check be met to follow-up labs to monitor this.  Plan for routine follow-up visit 6 months.  Follow-up sooner if needed.    -------------------------------------------------------------------------------------------------------------------------------------------------------------------------------------------------------------------------------------------------------------------  THE FOLLOWING IS COPIED FROM OV  NOTE 12/09/2016---NOT ADDRESSED AT OV 09/23/2017: Hyperglycemia OV 06/10/15 I gave and reviewed carbohydrate handout. As well she is restarting exercise as her foot is now healing. We'll check A1c today. If this is stable and can wait to repeat in follow-up in 6 months. 06/17/2016: Recheck labs to monitor - BASIC METABOLIC PANEL WITH GFR - Hemoglobin A1c 12/09/2016---Reviewed that A1C was goo 05/2016--5.7----can wait to monitor  Seasonal allergic rhinitis, unspecified trigger She is to cont Flonase. Add Zyrtec.  Discussed adding Prednisone today as she does have tenderness of maxillary sinuses--but she does not want prednisone unless absolutely necessary--she is to use flonase, zyrtec daily and if symptoms worsen, call me - cetirizine (ZYRTEC) 10 MG tablet; Take 1 tablet (10 mg total) by mouth daily.  Dispense: 30 tablet; Refill: 11   Screening mammogram, breast exam, pelvic exam--Per GYN-- Dr. Sabra Heck Vitamin D [facility] manages this. I will NOT recheck here. Screening Labs: I see in Epic that Dr. Sabra Heck checked TSH, FLP. TSH normal. FLP was good.  Screening Colonoscopy:  Patient reports she had one screening colonoscopy at age 59.--Dr. Benton  Patient reports that she has had one repeat colonoscopy since then--09/28/2012--says this one was performed secondary to abdominal pain she was experiencing.   Says that Dr. Harlene Ramus told her that he felt her abdominal pain was secondary to scar tissue secondary to all of her past abdominal surgeries.  Immunizations: Influenza:  Tetanus: She received a Tdap  booster here 01/29/2011 Pneumonia Vaccine: She has no indication to receive pneumonia vaccine until age 56  ---------------Shingrix---Discussed this at [place] 12/09/2016----she is to contact her insurance and find out coverage, cost then call our office-----------------------------   Routine office visit in 6 months or sooner if needed.  Signed, [address] [place], [place], BSFM 09/23/2017 2:22  PM

## 2017-09-24 ENCOUNTER — Ambulatory Visit: Payer: 59 | Admitting: Podiatry

## 2017-09-24 ENCOUNTER — Ambulatory Visit
Admission: RE | Admit: 2017-09-24 | Discharge: 2017-09-24 | Disposition: A | Discharge status: Home / Self Care | Payer: 59 | Source: Ambulatory Visit | Attending: Obstetrics & Gynecology | Admitting: Obstetrics & Gynecology

## 2017-09-24 DIAGNOSIS — Z1231 Encounter for screening mammogram for malignant neoplasm of breast: Secondary | ICD-10-CM

## 2017-09-24 LAB — BASIC METABOLIC PANEL WITH GFR
BUN: 15 mg/dL (ref 7–25)
CHLORIDE: 105 mmol/L (ref 98–110)
CO2: 29 mmol/L (ref 20–32)
Calcium: 9.5 mg/dL (ref 8.6–10.4)
Creat: 0.69 mg/dL (ref 0.50–0.99)
GFR, EST AFRICAN AMERICAN: 109 mL/min/{1.73_m2} (ref >=60)
GFR, Est Non African American: 94 mL/min/{1.73_m2} (ref >=60)
GLUCOSE: 122 mg/dL — AB (ref 65–99)
POTASSIUM: 3.7 mmol/L (ref 3.5–5.3)
Sodium: 143 mmol/L (ref 135–146)

## 2017-09-24 LAB — EXTRA LAV TOP TUBE

## 2017-09-27 ENCOUNTER — Other Ambulatory Visit: Payer: Self-pay | Admitting: Physician Assistant

## 2017-09-27 DIAGNOSIS — M25511 Pain in right shoulder: Secondary | ICD-10-CM | POA: Diagnosis not present

## 2017-09-27 DIAGNOSIS — R6889 Other general symptoms and signs: Secondary | ICD-10-CM | POA: Diagnosis not present

## 2017-09-27 DIAGNOSIS — M25611 Stiffness of right shoulder, not elsewhere classified: Secondary | ICD-10-CM | POA: Diagnosis not present

## 2017-10-04 DIAGNOSIS — M25611 Stiffness of right shoulder, not elsewhere classified: Secondary | ICD-10-CM | POA: Diagnosis not present

## 2017-10-04 DIAGNOSIS — R6889 Other general symptoms and signs: Secondary | ICD-10-CM | POA: Diagnosis not present

## 2017-10-04 DIAGNOSIS — R29898 Other symptoms and signs involving the musculoskeletal system: Secondary | ICD-10-CM | POA: Diagnosis not present

## 2017-10-05 DIAGNOSIS — I8312 Varicose veins of left lower extremity with inflammation: Secondary | ICD-10-CM | POA: Diagnosis not present

## 2017-10-11 DIAGNOSIS — M25611 Stiffness of right shoulder, not elsewhere classified: Secondary | ICD-10-CM | POA: Diagnosis not present

## 2017-10-11 DIAGNOSIS — R29898 Other symptoms and signs involving the musculoskeletal system: Secondary | ICD-10-CM | POA: Diagnosis not present

## 2017-10-11 DIAGNOSIS — R6889 Other general symptoms and signs: Secondary | ICD-10-CM | POA: Diagnosis not present

## 2017-10-18 DIAGNOSIS — I8311 Varicose veins of right lower extremity with inflammation: Secondary | ICD-10-CM | POA: Diagnosis not present

## 2017-10-19 DIAGNOSIS — R29898 Other symptoms and signs involving the musculoskeletal system: Secondary | ICD-10-CM | POA: Diagnosis not present

## 2017-10-19 DIAGNOSIS — Z4889 Encounter for other specified surgical aftercare: Secondary | ICD-10-CM | POA: Diagnosis not present

## 2017-10-19 DIAGNOSIS — M25611 Stiffness of right shoulder, not elsewhere classified: Secondary | ICD-10-CM | POA: Diagnosis not present

## 2017-10-19 DIAGNOSIS — R6889 Other general symptoms and signs: Secondary | ICD-10-CM | POA: Diagnosis not present

## 2017-10-29 ENCOUNTER — Other Ambulatory Visit: Payer: Self-pay | Admitting: Physician Assistant

## 2017-10-31 ENCOUNTER — Other Ambulatory Visit: Payer: Self-pay | Admitting: Obstetrics & Gynecology

## 2017-11-01 NOTE — Telephone Encounter (Signed)
Medication refill request: estrodiol Last AEX:  01/26/17 Next AEX: 04/01/18 Last MMG (if hormonal medication request): 09/27/17 Birads category 2 benign Refill authorized:  Please approve or deny if appropriate.

## 2017-11-15 ENCOUNTER — Other Ambulatory Visit: Payer: Self-pay

## 2017-11-15 ENCOUNTER — Encounter: Payer: Self-pay | Admitting: Family Medicine

## 2017-11-15 ENCOUNTER — Ambulatory Visit: Payer: 59 | Admitting: Family Medicine

## 2017-11-15 VITALS — BP 128/64 | HR 76 | Temp 98.1°F | Resp 14 | Ht 65.0 in | Wt 180.0 lb

## 2017-11-15 DIAGNOSIS — B009 Herpesviral infection, unspecified: Secondary | ICD-10-CM | POA: Diagnosis not present

## 2017-11-15 DIAGNOSIS — J3489 Other specified disorders of nose and nasal sinuses: Secondary | ICD-10-CM

## 2017-11-15 MED ORDER — VALACYCLOVIR HCL 1 G PO TABS: 1000.0000 mg | ORAL_TABLET | Freq: Two times a day (BID) | ORAL | 1 refills | Status: DC

## 2017-11-15 MED ORDER — MUPIROCIN CALCIUM 2 % EX CREA: 1.0000 "application " | TOPICAL_CREAM | Freq: Two times a day (BID) | CUTANEOUS | 0 refills | Status: DC

## 2017-11-15 NOTE — Progress Notes (Signed)
   Subjective:    Patient ID: [name], female    DOB: December 28, 1955, 62 y.o.   MRN: [id number]  Patient presents for Fever Blisters  She here with a outbreak of her fever blisters.  She has history of HSV has had her entire adult life.  She typically uses Valtrex and has not had a severe breakout in a couple years.  On occasion she will just use over-the-counter Abreva.  She was recently at the beach states that she ate something became sick sores popped out the bottom of her nose and around her mouth.  Since then she still been getting new fever blisters to pop out feels tingling sensation right before they come.  She does not have any fever no difficulty breathing her stomach virus has resolved.   Review Of Systems:  GEN- denies fatigue, fever, weight loss,weakness, recent illness HEENT- denies eye drainage, change in vision, nasal discharge, CVS- denies chest pain, palpitations RESP- denies SOB, cough, wheeze ABD- denies N/V, change in stools, abd pain Neuro- denies headache, dizziness, syncope, seizure activity       Objective:    BP 128/64   Pulse 76   Temp 98.1 F (36.7 C) (Oral)   Resp 14   Ht 5\' 5"  (1.651 m)   Wt 180 lb (81.6 kg)   LMP 04/27/1992   SpO2 99%   BMI 29.95 kg/m  GEN- NAD, alert and oriented x3 HEENT- PERRL, EOMI, non injected sclera, pink conjunctiva, MMM, oropharynx clear, 2 fever blisters upper lip, 1 beneath lower, right nares sore with mild drainge, scab above philtrum Neck- Supple, no LAD          Assessment & Plan:      Problem List Items Addressed This Visit      Unprioritized   Herpes simplex type 1 infection - Primary    Long standing history, currently with flare after recent illness Given valtrex, she can take 1 po BID for 3-7 days as needed For nasal sore, will give additional bactroban, due to superinfection       Relevant Medications   valACYclovir (VALTREX) 1000 MG tablet   mupirocin cream (BACTROBAN) 2 %    Other Visit  Diagnoses    Nasal sore          Note: This dictation was prepared with Dragon dictation along with smaller phrase technology. Any transcriptional errors that result from this process are unintentional.

## 2017-11-15 NOTE — Assessment & Plan Note (Signed)
Long standing history, currently with flare after recent illness Given valtrex, she can take 1 po BID for 3-7 days as needed For nasal sore, will give additional bactroban, due to superinfection

## 2017-11-15 NOTE — Patient Instructions (Addendum)
FU AS NEEDED  

## 2017-11-16 ENCOUNTER — Telehealth: Payer: Self-pay | Admitting: *Deleted

## 2017-11-16 NOTE — Telephone Encounter (Signed)
noted 

## 2017-11-16 NOTE — Telephone Encounter (Signed)
Call placed to patient to make aware.   Patient reports that she picked up the Valtrex on 11/15/2017 with no co-pay. Advised to continue Valtrex and we will switch to Acyclovir if required when refill is needed.

## 2017-11-16 NOTE — Telephone Encounter (Signed)
Received fax requesting alternative to Valtrex. Advised that Acyclovir tablets are the preferred alternative.   MD please advise.

## 2017-11-16 NOTE — Telephone Encounter (Signed)
Acyclovir 400mg  TID for cold sores  #30 R 1  Tell pt take for 5 days at a time

## 2017-11-25 ENCOUNTER — Encounter: Payer: Self-pay | Admitting: *Deleted

## 2017-11-25 ENCOUNTER — Other Ambulatory Visit: Payer: Self-pay

## 2017-11-25 ENCOUNTER — Ambulatory Visit (INDEPENDENT_AMBULATORY_CARE_PROVIDER_SITE_OTHER): Payer: 59 | Admitting: *Deleted

## 2017-11-25 DIAGNOSIS — Z23 Encounter for immunization: Secondary | ICD-10-CM | POA: Diagnosis not present

## 2017-11-25 NOTE — Progress Notes (Signed)
Patient seen in office for TDaP Vaccination.   Tolerated IM administration well.   Immunization history updated.

## 2017-11-28 ENCOUNTER — Other Ambulatory Visit: Payer: Self-pay | Admitting: Physician Assistant

## 2017-11-29 ENCOUNTER — Ambulatory Visit: Payer: 59 | Admitting: Physician Assistant

## 2017-12-01 DIAGNOSIS — M542 Cervicalgia: Secondary | ICD-10-CM | POA: Diagnosis not present

## 2017-12-01 DIAGNOSIS — M25511 Pain in right shoulder: Secondary | ICD-10-CM | POA: Diagnosis not present

## 2017-12-01 DIAGNOSIS — M25611 Stiffness of right shoulder, not elsewhere classified: Secondary | ICD-10-CM | POA: Diagnosis not present

## 2017-12-03 ENCOUNTER — Other Ambulatory Visit: Payer: Self-pay | Admitting: Podiatry

## 2017-12-03 MED ORDER — MELOXICAM 15 MG PO TABS: 15.0000 mg | ORAL_TABLET | Freq: Every day | ORAL | 0 refills | Status: DC

## 2017-12-03 NOTE — Telephone Encounter (Signed)
Orders for meloxicam called to [facility] [phone]

## 2017-12-03 NOTE — Addendum Note (Signed)
Addended by: Alphia Kava'CONNELL, VALERY D on: 12/03/2017 04:20 PM   Modules accepted: Orders

## 2017-12-13 DIAGNOSIS — M542 Cervicalgia: Secondary | ICD-10-CM | POA: Diagnosis not present

## 2017-12-13 DIAGNOSIS — M25611 Stiffness of right shoulder, not elsewhere classified: Secondary | ICD-10-CM | POA: Diagnosis not present

## 2017-12-13 DIAGNOSIS — M25511 Pain in right shoulder: Secondary | ICD-10-CM | POA: Diagnosis not present

## 2017-12-30 DIAGNOSIS — M25511 Pain in right shoulder: Secondary | ICD-10-CM | POA: Diagnosis not present

## 2017-12-30 DIAGNOSIS — M25611 Stiffness of right shoulder, not elsewhere classified: Secondary | ICD-10-CM | POA: Diagnosis not present

## 2017-12-30 DIAGNOSIS — M542 Cervicalgia: Secondary | ICD-10-CM | POA: Diagnosis not present

## 2018-01-02 ENCOUNTER — Other Ambulatory Visit: Payer: Self-pay | Admitting: Physician Assistant

## 2018-01-02 ENCOUNTER — Other Ambulatory Visit: Payer: Self-pay | Admitting: Podiatry

## 2018-01-04 ENCOUNTER — Telehealth: Payer: Self-pay | Admitting: Podiatry

## 2018-01-04 NOTE — Telephone Encounter (Signed)
I got a text from my pharmacy where they are trying to get a refill of my meloxicam with Dr. Ardelle Anton. I don't need that and I don't know why they are doing that. There is no need to refill that. I called the drugstore and they tried to retrieve it but I don't know if they stopped it or not. I just wanted the nurse and Dr. Ardelle Anton to know I didn't ask or need the refill of the meloxicam. So if you get the request, just deny it. Thank you.

## 2018-01-05 DIAGNOSIS — Z23 Encounter for immunization: Secondary | ICD-10-CM | POA: Diagnosis not present

## 2018-01-12 DIAGNOSIS — M25611 Stiffness of right shoulder, not elsewhere classified: Secondary | ICD-10-CM | POA: Diagnosis not present

## 2018-01-12 DIAGNOSIS — M25511 Pain in right shoulder: Secondary | ICD-10-CM | POA: Diagnosis not present

## 2018-01-12 DIAGNOSIS — M542 Cervicalgia: Secondary | ICD-10-CM | POA: Diagnosis not present

## 2018-02-01 DIAGNOSIS — L820 Inflamed seborrheic keratosis: Secondary | ICD-10-CM | POA: Diagnosis not present

## 2018-02-02 ENCOUNTER — Other Ambulatory Visit: Payer: Self-pay | Admitting: Physician Assistant

## 2018-02-02 DIAGNOSIS — R1031 Right lower quadrant pain: Secondary | ICD-10-CM | POA: Diagnosis not present

## 2018-02-04 ENCOUNTER — Other Ambulatory Visit: Payer: Self-pay | Admitting: Surgery

## 2018-02-04 DIAGNOSIS — R1031 Right lower quadrant pain: Secondary | ICD-10-CM

## 2018-02-07 ENCOUNTER — Other Ambulatory Visit: Payer: Self-pay | Admitting: Surgery

## 2018-02-07 ENCOUNTER — Ambulatory Visit (HOSPITAL_COMMUNITY)
Admission: RE | Admit: 2018-02-07 | Discharge: 2018-02-07 | Disposition: A | Discharge status: Home / Self Care | Payer: 59 | Source: Ambulatory Visit | Attending: Surgery | Admitting: Surgery

## 2018-02-07 DIAGNOSIS — M160 Bilateral primary osteoarthritis of hip: Secondary | ICD-10-CM | POA: Diagnosis not present

## 2018-02-07 DIAGNOSIS — R1031 Right lower quadrant pain: Secondary | ICD-10-CM

## 2018-03-03 ENCOUNTER — Other Ambulatory Visit: Payer: Self-pay | Admitting: Physician Assistant

## 2018-03-04 ENCOUNTER — Other Ambulatory Visit: Payer: Self-pay | Admitting: Physician Assistant

## 2018-03-14 ENCOUNTER — Other Ambulatory Visit: Payer: Self-pay

## 2018-03-14 ENCOUNTER — Encounter

## 2018-03-14 ENCOUNTER — Encounter: Payer: Self-pay | Admitting: Obstetrics & Gynecology

## 2018-03-14 ENCOUNTER — Ambulatory Visit: Payer: 59 | Admitting: Obstetrics & Gynecology

## 2018-03-14 VITALS — BP 112/70 | HR 88 | Resp 16 | Ht 65.0 in | Wt 175.2 lb

## 2018-03-14 DIAGNOSIS — E559 Vitamin D deficiency, unspecified: Secondary | ICD-10-CM

## 2018-03-14 DIAGNOSIS — K219 Gastro-esophageal reflux disease without esophagitis: Secondary | ICD-10-CM | POA: Diagnosis not present

## 2018-03-14 DIAGNOSIS — R1011 Right upper quadrant pain: Secondary | ICD-10-CM | POA: Diagnosis not present

## 2018-03-14 DIAGNOSIS — Z01419 Encounter for gynecological examination (general) (routine) without abnormal findings: Secondary | ICD-10-CM

## 2018-03-14 DIAGNOSIS — R1013 Epigastric pain: Secondary | ICD-10-CM | POA: Diagnosis not present

## 2018-03-14 MED ORDER — MEDROXYPROGESTERONE ACETATE 2.5 MG PO TABS: 2.5000 mg | ORAL_TABLET | Freq: Every day | ORAL | 4 refills | Status: DC

## 2018-03-14 MED ORDER — ESTRADIOL 1 MG PO TABS: 1.0000 mg | ORAL_TABLET | Freq: Every day | ORAL | 4 refills | Status: DC

## 2018-03-14 NOTE — Patient Instructions (Signed)
Schedule your bone density with your mammogram in early May.  There is an active order.

## 2018-03-14 NOTE — Progress Notes (Signed)
62 y.o. G2P2 Married White or Caucasian female here for annual exam.  Had shoulder surgery in Jan, 2019.  This was done with Dr. Sherlean Foot.  She does have some limitations with range of motion but no pain.  Really please with this.    Denies vaginal bleeding.  Saw Dr. Matthias Hughs this morning due to reflux issues.  Had blood work this morning.    PCP: [facility].    Patient's last menstrual period was 04/27/1992.          Sexually active: Yes.    The current method of family planning is status post hysterectomy.    Exercising: Yes.    elliptical, treadmilll Smoker:  no  Health Maintenance: Pap:  10/22/10 Normal  History of abnormal Pap:  no MMG:  09/24/17 BIRADS2:Benign  Colonoscopy:  09/28/12 Normal  BMD:   No TDaP:  2019  Pneumonia vaccine(s):  n/a Shingrix: completed  Hep C testing: 01/23/16 Neg  Screening Labs: Vitamin D   reports that she has never smoked. She has never used smokeless tobacco. She reports that she drinks about 2.0 - 3.0 standard drinks of alcohol per week. She reports that she does not use drugs.  Past Medical History:  Diagnosis Date  . Broken ankle 1/16   will have surgery on 01/30/15-right ankle  . Elevated hemoglobin A1c 01/17/15   6.0  . Endometriosis   . Herpes simplex type 1 infection   . Hypertension   . Migraine    h/o migraines, none since hysterectomy  . Varicose vein    surgery on right leg    Past Surgical History:  Procedure Laterality Date  . ABDOMINAL HYSTERECTOMY  1994   LAVH/RSO  . ANKLE SURGERY Right   . BREAST BIOPSY Left   . CESAREAN SECTION    . CHOLECYSTECTOMY    . COLON RESECTION  11/06   and lap LSO  . CYSTECTOMY    . HERNIA REPAIR  1982  . HERNIA REPAIR  2003   and bladder repair  . SHOULDER SURGERY Right 04/2017   Tendon repair     Current Outpatient Medications  Medication Sig Dispense Refill  . aspirin 81 MG tablet Take 81 mg by mouth daily.    . benazepril (LOTENSIN) 20 MG tablet TAKE 1 TABLET(20  MG) BY MOUTH DAILY 30 tablet 0  . CALCIUM PO Take by mouth daily.    Marland Kitchen estradiol (ESTRACE) 1 MG tablet TAKE 1 TABLET BY MOUTH ONCE DAILY 90 tablet 0  . fluticasone (FLONASE) 50 MCG/ACT nasal spray Place 2 sprays into both nostrils daily. 16 g 6  . hydrochlorothiazide (HYDRODIURIL) 25 MG tablet TAKE 1 TABLET BY MOUTH EVERY DAY 90 tablet 0  . KRILL OIL PO Take by mouth. Omega red krill oil    . medroxyPROGESTERone (PROVERA) 2.5 MG tablet Take 1 tablet (2.5 mg total) by mouth daily. 90 tablet 4  . mupirocin cream (BACTROBAN) 2 % Apply 1 application topically 2 (two) times daily. 15 g 0  . Red Yeast Rice Extract (RED YEAST RICE PO) Take by mouth daily.    . vitamin C (ASCORBIC ACID) 500 MG tablet Take 500 mg by mouth 2 (two) times daily.    . valACYclovir (VALTREX) 1000 MG tablet Take 1 tablet (1,000 mg total) by mouth 2 (two) times daily. (Patient not taking: Reported on 03/14/2018) 30 tablet 1   No current facility-administered medications for this visit.     Family History  Problem Relation Age of  Onset  . Skin cancer Unknown   . Lung cancer Father   . Spina bifida Daughter        and hydrocephalia  . Diabetes Mother   . Hypertension Mother   . Thyroid nodules Mother        being watched  . Diabetes Sister        x 3  . Hypertension Sister   . Thyroid nodules Sister     Review of Systems  HENT: Positive for congestion.   Gastrointestinal: Positive for abdominal pain.  All other systems reviewed and are negative.   Exam:   BP 112/70 (BP Location: Right Arm, Patient Position: Sitting, Cuff Size: Large)   Pulse 88   Resp 16   Ht 5\' 5"  (1.651 m)   Wt 175 lb 3.2 oz (79.5 kg)   LMP 04/27/1992   BMI 29.15 kg/m   Height: 5\' 5"  (165.1 cm)  Ht Readings from Last 3 Encounters:  03/14/18 5\' 5"  (1.651 m)  11/15/17 5\' 5"  (1.651 m)  09/23/17 5\' 5"  (1.651 m)    General appearance: alert, cooperative and appears stated age Head: Normocephalic, without obvious abnormality,  atraumatic Neck: no adenopathy, supple, symmetrical, trachea midline and thyroid normal to inspection and palpation Lungs: clear to auscultation bilaterally Breasts: normal appearance, no masses or tenderness Heart: regular rate and rhythm Abdomen: soft, non-tender; bowel sounds normal; no masses,  no organomegaly Extremities: extremities normal, atraumatic, no cyanosis or edema Skin: Skin color, texture, turgor normal. No rashes or lesions Lymph nodes: Cervical, supraclavicular, and axillary nodes normal. No abnormal inguinal nodes palpated Neurologic: Grossly normal   Pelvic: External genitalia:  no lesions              Urethra:  normal appearing urethra with no masses, tenderness or lesions              Bartholins and Skenes: normal                 Vagina: normal appearing vagina with normal color and discharge, no lesions              Cervix: absent              Pap taken: No. Bimanual Exam:  Uterus:  uterus absent              Adnexa: no mass, fullness, tenderness               Rectovaginal: Confirms               Anus:  normal sphincter tone, no lesions  Chaperone was present for exam.  A:  Well Woman with normal exam PMP, on HRT H/O LAVH/TSO, then later LSO due to endometriosis Hypertension Borderline cholesterol H/O oral HSV H/O Vit D deficiency Shoulder surgery in Jan, 2019  P:   Mammogram guidelines reviewed pap smear not indicated On HRT.  Risks reviewed.  Estradiol 1.0mg  daily and provera 2.5mg  daily.  #90/4RF. Vit D level obtained today Having GI evaluation with Dr. Matthias HughsBuccini. return annually or prn

## 2018-03-15 LAB — VITAMIN D 25 HYDROXY (VIT D DEFICIENCY, FRACTURES): Vit D, 25-Hydroxy: 20.6 ng/mL — ABNORMAL LOW (ref 30.0–100.0)

## 2018-03-17 ENCOUNTER — Telehealth: Payer: Self-pay

## 2018-03-17 DIAGNOSIS — E559 Vitamin D deficiency, unspecified: Secondary | ICD-10-CM

## 2018-03-17 MED ORDER — VITAMIN D (ERGOCALCIFEROL) 1.25 MG (50000 UNIT) PO CAPS: 50000.0000 [IU] | ORAL_CAPSULE | ORAL | 0 refills | Status: DC

## 2018-03-17 NOTE — Telephone Encounter (Signed)
Spoke with patient to inform her that she is vit. D is low. 50K Vit D sent to pharmacy on file. Patient will call back to make lab appointment.

## 2018-03-17 NOTE — Telephone Encounter (Signed)
-----   Message from Jerene BearsMary S [name], MD sent at 03/16/2018  6:03 AM EST ----- Please let pt know her vit d was 20.  Needs 50K Vit D weekly and then have this repeated in 12 weeks.  No orders have been placed.

## 2018-03-23 ENCOUNTER — Other Ambulatory Visit: Payer: Self-pay | Admitting: Obstetrics & Gynecology

## 2018-03-23 DIAGNOSIS — E559 Vitamin D deficiency, unspecified: Secondary | ICD-10-CM

## 2018-03-23 NOTE — Progress Notes (Signed)
Vit D order placed.

## 2018-04-01 ENCOUNTER — Other Ambulatory Visit: Payer: Self-pay | Admitting: Family Medicine

## 2018-04-01 ENCOUNTER — Ambulatory Visit: Payer: 59 | Admitting: Obstetrics & Gynecology

## 2018-04-05 ENCOUNTER — Other Ambulatory Visit: Payer: Self-pay | Admitting: Obstetrics & Gynecology

## 2018-04-07 ENCOUNTER — Ambulatory Visit: Payer: 59 | Admitting: Physician Assistant

## 2018-04-11 ENCOUNTER — Ambulatory Visit: Payer: 59 | Admitting: Family Medicine

## 2018-04-11 ENCOUNTER — Encounter: Payer: Self-pay | Admitting: Family Medicine

## 2018-04-11 VITALS — BP 128/78 | HR 86 | Temp 98.5°F | Resp 18 | Ht 65.0 in | Wt 180.6 lb

## 2018-04-11 DIAGNOSIS — I10 Essential (primary) hypertension: Secondary | ICD-10-CM | POA: Diagnosis not present

## 2018-04-11 DIAGNOSIS — K219 Gastro-esophageal reflux disease without esophagitis: Secondary | ICD-10-CM

## 2018-04-11 DIAGNOSIS — E669 Obesity, unspecified: Secondary | ICD-10-CM | POA: Insufficient documentation

## 2018-04-11 DIAGNOSIS — E7439 Other disorders of intestinal carbohydrate absorption: Secondary | ICD-10-CM | POA: Diagnosis not present

## 2018-04-11 DIAGNOSIS — Z6830 Body mass index (BMI) 30.0-30.9, adult: Secondary | ICD-10-CM

## 2018-04-11 DIAGNOSIS — E6609 Other obesity due to excess calories: Secondary | ICD-10-CM | POA: Diagnosis not present

## 2018-04-11 MED ORDER — ESOMEPRAZOLE MAGNESIUM 40 MG PO CPDR: 40.0000 mg | DELAYED_RELEASE_CAPSULE | Freq: Every day | ORAL | 0 refills | Status: DC

## 2018-04-11 NOTE — Patient Instructions (Signed)
F/U 2 Weeks FOR LABS No changes to medications

## 2018-04-11 NOTE — Assessment & Plan Note (Signed)
Blood pressure is controlled.  She will return for fasting labs for her cholesterol.  I will also recheck her A1c in the setting of her obesity and glucose intolerance and family history.  I reviewed her last few notes.

## 2018-04-11 NOTE — Assessment & Plan Note (Signed)
She has gained some weight as she has had some surgeries over the past couple years.  We discussed getting back to her exercise routine and healthy eating.

## 2018-04-11 NOTE — Progress Notes (Signed)
   Subjective:    Patient ID: [name], female    DOB: 1956-03-04, 62 y.o.   MRN: [id number]  Patient presents for Follow-up and Hypertension   Patient here to follow-up chronic medical problems.  She was previously seen by my PA who has retired. Occasions and history reviewed   [name] - GI , severe GERD , recently started on Nexium 40mg  colonoscopy up-to-date   [name] GYN prescribed Vitamin D , now on  50,000IU weekly    History of breast biopsy came back benign 2 years ago, getting yearly mammograms, [facility]   she is on estradiol- planning to start slowly tapering down over the next year    Hypertension- taking benzapril, she lost 50lbs about 7 years ago,, weight back up 20lbs over the past 2 years   Readings at home 104-139/73-80's     Dermatology- [name] .  She has had multiple precancerous lesions removed  Reviewed her last set of labs.  Did have a borderline A1c elevation at 5.9%        Review Of Systems:  GEN- denies fatigue, fever, weight loss,weakness, recent illness HEENT- denies eye drainage, change in vision, nasal discharge, CVS- denies chest pain, palpitations RESP- denies SOB, cough, wheeze ABD- denies N/V, change in stools, abd pain GU- denies dysuria, hematuria, dribbling, incontinence MSK- denies joint pain, muscle aches, injury Neuro- denies headache, dizziness, syncope, seizure activity       Objective:    BP 128/78   Pulse 86   Temp 98.5 F (36.9 C) (Oral)   Resp 18   Ht 5\' 5"  (1.651 m)   Wt 180 lb 9.6 oz (81.9 kg)   LMP 04/27/1992   SpO2 96%   BMI 30.05 kg/m  GEN- NAD, alert and oriented x3 HEENT- PERRL, EOMI, non injected sclera, pink conjunctiva, MMM, oropharynx clear Neck- Supple, no thyromegaly CVS- RRR, no murmur RESP-CTAB ABD-NABS,soft,NT,ND EXT- No edema Pulses- Radial, DP- 2+        Assessment & Plan:      Problem List Items Addressed This Visit      Unprioritized   GERD (gastroesophageal reflux disease)   Relevant Medications   esomeprazole (NEXIUM) 40 MG capsule   Glucose intolerance   Relevant Orders   Hemoglobin A1c   Hypertension - Primary    Blood pressure is controlled.  She will return for fasting labs for her cholesterol.  I will also recheck her A1c in the setting of her obesity and glucose intolerance and family history.  I reviewed her last few notes.      Relevant Orders   CBC with Differential/Platelet   Comprehensive metabolic panel   Lipid panel   Obesity    She has gained some weight as she has had some surgeries over the past couple years.  We discussed getting back to her exercise routine and healthy eating.      Relevant Orders   Lipid panel      Note: This dictation was prepared with Dragon dictation along with smaller phrase technology. Any transcriptional errors that result from this process are unintentional.

## 2018-05-04 ENCOUNTER — Other Ambulatory Visit: Payer: Self-pay | Admitting: Family Medicine

## 2018-06-02 ENCOUNTER — Other Ambulatory Visit: Payer: Self-pay | Admitting: Family Medicine

## 2018-06-09 ENCOUNTER — Other Ambulatory Visit: Payer: Self-pay | Admitting: Obstetrics & Gynecology

## 2018-06-10 ENCOUNTER — Encounter: Payer: Self-pay | Admitting: Family Medicine

## 2018-06-10 ENCOUNTER — Ambulatory Visit: Payer: 59 | Admitting: Family Medicine

## 2018-06-10 VITALS — BP 122/84 | HR 75 | Temp 98.4°F | Resp 16 | Ht 65.0 in | Wt 180.4 lb

## 2018-06-10 DIAGNOSIS — J029 Acute pharyngitis, unspecified: Secondary | ICD-10-CM | POA: Diagnosis not present

## 2018-06-10 MED ORDER — AMOXICILLIN 500 MG PO TABS: 500.0000 mg | ORAL_TABLET | Freq: Two times a day (BID) | ORAL | 0 refills | Status: AC

## 2018-06-10 NOTE — Patient Instructions (Addendum)
Treat for pharyngitis with amoxicillin - we'll call you with the strep test early next week.    Strep Throat  Strep throat is a bacterial infection of the throat. Your health care provider may call the infection tonsillitis or pharyngitis, depending on whether there is swelling in the tonsils or at the back of the throat. Strep throat is most common during the cold months of the year in children who are 8-62 years of age, but it can happen during any season in people of any age. This infection is spread from person to person (contagious) through coughing, sneezing, or close contact. What are the causes? Strep throat is caused by the bacteria called Streptococcus pyogenes. What increases the risk? This condition is more likely to develop in:  People who spend time in crowded places where the infection can spread easily.  People who have close contact with someone who has strep throat. What are the signs or symptoms? Symptoms of this condition include:  Fever or chills.  Redness, swelling, or pain in the tonsils or throat.  Pain or difficulty when swallowing.  White or yellow spots on the tonsils or throat.  Swollen, tender glands in the neck or under the jaw.  Red rash all over the body (rare). How is this diagnosed? This condition is diagnosed by performing a rapid strep test or by taking a swab of your throat (throat culture test). Results from a rapid strep test are usually ready in a few minutes, but throat culture test results are available after one or two days. How is this treated? This condition is treated with antibiotic medicine. Follow these instructions at home: Medicines  Take over-the-counter and prescription medicines only as told by your health care provider.  Take your antibiotic as told by your health care provider. Do not stop taking the antibiotic even if you start to feel better.  Have family members who also have a sore throat or fever tested for strep  throat. They may need antibiotics if they have the strep infection. Eating and drinking  Do not share food, drinking cups, or personal items that could cause the infection to spread to other people.  If swallowing is difficult, try eating soft foods until your sore throat feels better.  Drink enough fluid to keep your urine clear or pale yellow. General instructions  Gargle with a salt-water mixture 3-4 times per day or as needed. To make a salt-water mixture, completely dissolve -1 tsp of salt in 1 cup of warm water.  Make sure that all household members wash their hands well.  Get plenty of rest.  Stay home from school or work until you have been taking antibiotics for 24 hours.  Keep all follow-up visits as told by your health care provider. This is important. Contact a health care provider if:  The glands in your neck continue to get bigger.  You develop a rash, cough, or earache.  You cough up a thick liquid that is green, yellow-brown, or bloody.  You have pain or discomfort that does not get better with medicine.  Your problems seem to be getting worse rather than better.  You have a fever. Get help right away if:  You have new symptoms, such as vomiting, severe headache, stiff or painful neck, chest pain, or shortness of breath.  You have severe throat pain, drooling, or changes in your voice.  You have swelling of the neck, or the skin on the neck becomes red and tender.  You  have signs of dehydration, such as fatigue, dry mouth, and decreased urination.  You become increasingly sleepy, or you cannot wake up completely.  Your joints become red or painful. This information is not intended to replace advice given to you by your health care provider. Make sure you discuss any questions you have with your health care provider. Document Released: 04/10/2000 Document Revised: 12/11/2015 Document Reviewed: 08/06/2014 Elsevier Interactive Patient Education  [employer].

## 2018-06-10 NOTE — Progress Notes (Signed)
Patient ID: [name], female    DOB: 07/11/1955, 63 y.o.   MRN: [id number]  PCP: Salley Scarlet, MD  Chief Complaint  Patient presents with  . Sore Throat    Patient in today with c/o difficulty swallowing. Has been exposed to strep. Onset 2 days ago.     Subjective:   [name] is a 63 y.o. female, presents to clinic with CC of sore throat and strep exposure with 4 family members at home.  Wednesday, 2 days ago she developed sore throat, HA, stomach ache, sore throat a little better today but still feels swollen.  Feels like when she gets strep, which she has had as an adult.    Patient Active Problem List   Diagnosis Date Noted  . Glucose intolerance 04/11/2018  . Obesity 04/11/2018  . GERD (gastroesophageal reflux disease) 04/11/2018  . Tendonitis 08/29/2017  . Allergic rhinitis 12/09/2016  . Tendon tear, ankle 03/07/2015  . Ankle instability 03/07/2015  . Vitamin D deficiency 12/04/2013  . Herpes simplex type 1 infection   . Hypertension   . S/P right inguinal hernia repair-Davol 3D max mesh 2003 08/05/2012  . S/P arthroscopy of right shoulder 08/05/2012     Prior to Admission medications   Medication Sig Start Date End Date Taking? Authorizing Provider  aspirin 81 MG tablet Take 81 mg by mouth daily.   Yes [provider]  benazepril (LOTENSIN) 20 MG tablet TAKE 1 TABLET(20 MG) BY MOUTH DAILY 06/02/18  Yes Donita Brooks, MD  CALCIUM PO Take by mouth daily.   Yes [provider]  esomeprazole (NEXIUM) 40 MG capsule Take 1 capsule (40 mg total) by mouth daily. 04/11/18  Yes Hiltonia, Velna Hatchet, MD  estradiol (ESTRACE) 1 MG tablet Take 1 tablet (1 mg total) by mouth daily. 03/14/18  Yes Jerene Bears, MD  hydrochlorothiazide (HYDRODIURIL) 25 MG tablet TAKE 1 TABLET BY MOUTH EVERY DAY 06/02/18  Yes Donita Brooks, MD  KRILL OIL PO Take by mouth. Omega red krill oil   Yes [provider]  medroxyPROGESTERone (PROVERA) 2.5 MG  tablet Take 1 tablet (2.5 mg total) by mouth daily. 03/14/18  Yes Jerene Bears, MD  Red Yeast Rice Extract (RED YEAST RICE PO) Take by mouth daily.   Yes [provider]  vitamin C (ASCORBIC ACID) 500 MG tablet Take 500 mg by mouth 2 (two) times daily.   Yes [provider]  Vitamin D, Ergocalciferol, (DRISDOL) 1.25 MG (50000 UT) CAPS capsule Take 1 capsule (50,000 Units total) by mouth every 7 (seven) days. 03/17/18  Yes Jerene Bears, MD  fluticasone [facility]) 50 MCG/ACT nasal spray Place 2 sprays into both nostrils daily. Patient not taking: Reported on 06/10/2018 06/06/14   Dorena Bodo, PA-C  valACYclovir (VALTREX) 1000 MG tablet Take 1 tablet (1,000 mg total) by mouth 2 (two) times daily. Patient not taking: Reported on 03/14/2018 11/15/17   Salley Scarlet, MD     Allergies  Allergen Reactions  . Betadine [Povidone Iodine] Itching  . Codeine   . Other     Band aids-itchy, red rash     Family History  Problem Relation Age of Onset  . Skin cancer Unknown   . Lung cancer Father   . Spina bifida Daughter        and hydrocephalia  . Diabetes Mother   . Hypertension Mother   . Thyroid nodules Mother  being watched  . Diabetes Sister        x 3  . Hypertension Sister   . Thyroid nodules Sister      Social History   Socioeconomic History  . Marital status: Married    Spouse name: Not on file  . Number of children: Not on file  . Years of education: Not on file  . Highest education level: Not on file  Occupational History  . Not on file  Social Needs  . Financial resource strain: Not on file  . Food insecurity:    Worry: Not on file    Inability: Not on file  . Transportation needs:    Medical: Not on file    Non-medical: Not on file  Tobacco Use  . Smoking status: Never Smoker  . Smokeless tobacco: Never Used  Substance and Sexual Activity  . Alcohol use: Yes    Alcohol/week: 2.0 - 3.0 standard drinks    Types: 2 - 3 Glasses of  wine per week  . Drug use: No  . Sexual activity: Yes    Partners: Male    Birth control/protection: Surgical    Comment: LAVH/RSO, then LSO  Lifestyle  . Physical activity:    Days per week: Not on file    Minutes per session: Not on file  . Stress: Not on file  Relationships  . Social connections:    Talks on phone: Not on file    Gets together: Not on file    Attends religious service: Not on file    Active member of club or organization: Not on file    Attends meetings of clubs or organizations: Not on file    Relationship status: Not on file  . Intimate partner violence:    Fear of current or ex partner: Not on file    Emotionally abused: Not on file    Physically abused: Not on file    Forced sexual activity: Not on file  Other Topics Concern  . Not on file  Social History Narrative  . Not on file     Review of Systems  Constitutional: Negative.   HENT: Negative.   Eyes: Negative.   Respiratory: Negative.   Cardiovascular: Negative.   Gastrointestinal: Negative.   Endocrine: Negative.   Genitourinary: Negative.   Musculoskeletal: Negative.   Skin: Negative.   Allergic/Immunologic: Negative.   Neurological: Negative.   Hematological: Negative.   Psychiatric/Behavioral: Negative.   All other systems reviewed and are negative.      Objective:    Vitals:   06/10/18 0953  BP: 122/84  Pulse: 75  Resp: 16  Temp: 98.4 F (36.9 C)  TempSrc: Oral  SpO2: 100%  Weight: 180 lb 6 oz (81.8 kg)  Height: 5\' 5"  (1.651 m)      Physical Exam Vitals signs and nursing note reviewed.  Constitutional:      General: She is not in acute distress.    Appearance: She is well-developed. She is not ill-appearing, toxic-appearing or diaphoretic.  HENT:     Head: Normocephalic and atraumatic.     Jaw: There is normal jaw occlusion.     Right Ear: Tympanic membrane, ear canal and external ear normal.     Left Ear: Tympanic membrane, ear canal and external ear normal.       Nose: Nose normal. No congestion or rhinorrhea.     Mouth/Throat:     Mouth: Mucous membranes are moist.     Pharynx: Uvula midline.  Posterior oropharyngeal erythema present. No oropharyngeal exudate.     Tonsils: No tonsillar exudate. Swelling: 1+ on the right. 1+ on the left.  Eyes:     General:        Right eye: No discharge.        Left eye: No discharge.     Conjunctiva/sclera: Conjunctivae normal.     Pupils: Pupils are equal, round, and reactive to light.  Neck:     Musculoskeletal: Normal range of motion. No muscular tenderness.     Trachea: No tracheal deviation.  Cardiovascular:     Rate and Rhythm: Normal rate and regular rhythm.     Pulses: Normal pulses.     Heart sounds: Normal heart sounds.  Pulmonary:     Effort: Pulmonary effort is normal. No respiratory distress.     Breath sounds: No stridor.  Musculoskeletal: Normal range of motion.  Lymphadenopathy:     Cervical: Cervical adenopathy (mild) present.  Skin:    General: Skin is warm and dry.     Findings: No rash.  Neurological:     Mental Status: She is alert.     Motor: No abnormal muscle tone.     Coordination: Coordination normal.  Psychiatric:        Behavior: Behavior normal.           Assessment & Plan:      ICD-10-CM   1. Sore throat J02.9 STREP GROUP A AG, W/REFLEX TO CULT    Culture, Group A Strep    amoxicillin (AMOXIL) 500 MG tablet    Pt presents with sore throat and recent exposure to multiple family members with confirmed strep throat.  She endorses pain and sensation of swelling with stomach ache, similar to last time she had strep throat.  Rapid strep is negative here today, her physical exam was noted for some lymphatic tissue visible even though she has had her tonsils removed, and posterior oropharyngeal erythema, will tx with abx  given her endorse history and exposure to multiple family members.   Danelle BerryLeisa Remberto Lienhard, PA-C 06/10/18 10:23 AM

## 2018-06-12 LAB — CULTURE, GROUP A STREP
MICRO NUMBER: 196874
SPECIMEN QUALITY:: ADEQUATE

## 2018-06-12 LAB — STREP GROUP A AG, W/REFLEX TO CULT: STREPTOCOCCUS, GROUP A SCREEN (DIRECT): NOT DETECTED

## 2018-06-14 ENCOUNTER — Other Ambulatory Visit (INDEPENDENT_AMBULATORY_CARE_PROVIDER_SITE_OTHER): Payer: 59

## 2018-06-14 ENCOUNTER — Other Ambulatory Visit: Payer: Self-pay

## 2018-06-14 DIAGNOSIS — E559 Vitamin D deficiency, unspecified: Secondary | ICD-10-CM

## 2018-06-15 ENCOUNTER — Encounter: Payer: Self-pay | Admitting: Family Medicine

## 2018-06-15 LAB — VITAMIN D 25 HYDROXY (VIT D DEFICIENCY, FRACTURES): Vit D, 25-Hydroxy: 31 ng/mL (ref 30.0–100.0)

## 2018-07-01 ENCOUNTER — Other Ambulatory Visit: Payer: Self-pay | Admitting: Family Medicine

## 2018-07-29 ENCOUNTER — Other Ambulatory Visit: Payer: Self-pay | Admitting: Family Medicine

## 2018-08-26 ENCOUNTER — Other Ambulatory Visit: Payer: Self-pay | Admitting: Family Medicine

## 2018-08-26 ENCOUNTER — Other Ambulatory Visit: Payer: Self-pay | Admitting: Obstetrics & Gynecology

## 2018-08-26 NOTE — Telephone Encounter (Signed)
Medication refill request: estrace 1mg  tablet rx denied today. rx for 88yr sent on 03-14-18 to [facility] [place].

## 2018-08-29 ENCOUNTER — Other Ambulatory Visit: Payer: Self-pay | Admitting: Family Medicine

## 2018-08-29 ENCOUNTER — Telehealth: Payer: Self-pay | Admitting: Obstetrics & Gynecology

## 2018-08-29 DIAGNOSIS — Z1231 Encounter for screening mammogram for malignant neoplasm of breast: Secondary | ICD-10-CM

## 2018-08-29 NOTE — Telephone Encounter (Signed)
Patient is calling to give Dr. Hyacinth Meeker update on reduced estradiol tablet. Patient stated that she has been taking 0.5 MG and is "doing just fine."   Cc: Dr. Hyacinth Meeker and triage for [facility].

## 2018-09-30 ENCOUNTER — Other Ambulatory Visit: Payer: Self-pay

## 2018-09-30 MED ORDER — VALACYCLOVIR HCL 1 G PO TABS: 1000.0000 mg | ORAL_TABLET | Freq: Two times a day (BID) | ORAL | 1 refills | Status: DC

## 2018-10-05 NOTE — Telephone Encounter (Signed)
OK to close or is further follow up necessary? °

## 2018-10-06 NOTE — Telephone Encounter (Signed)
Please check with pt to see if she needs rx for this.  I think she was cutting her estradiol in half.  Could you please see if she needs a prescription?  Thanks.

## 2018-10-07 NOTE — Telephone Encounter (Signed)
Patient is cutting tablet in half and states she still has about 2 months worth of pills. She states she will call back when she gets low to get a refill for the 0.5mg  estradiol tablet.   Dr. Lestine Box Encounter closed.

## 2018-10-19 ENCOUNTER — Encounter: Payer: Self-pay | Admitting: Family Medicine

## 2018-10-19 ENCOUNTER — Ambulatory Visit: Payer: 59 | Admitting: Family Medicine

## 2018-10-19 ENCOUNTER — Other Ambulatory Visit: Payer: Self-pay

## 2018-10-19 VITALS — BP 128/74 | HR 88 | Temp 97.9°F | Resp 12 | Ht 65.0 in | Wt 182.0 lb

## 2018-10-19 DIAGNOSIS — Z6830 Body mass index (BMI) 30.0-30.9, adult: Secondary | ICD-10-CM

## 2018-10-19 DIAGNOSIS — I10 Essential (primary) hypertension: Secondary | ICD-10-CM

## 2018-10-19 DIAGNOSIS — K219 Gastro-esophageal reflux disease without esophagitis: Secondary | ICD-10-CM

## 2018-10-19 DIAGNOSIS — B009 Herpesviral infection, unspecified: Secondary | ICD-10-CM

## 2018-10-19 DIAGNOSIS — E7439 Other disorders of intestinal carbohydrate absorption: Secondary | ICD-10-CM | POA: Diagnosis not present

## 2018-10-19 DIAGNOSIS — E66811 Other obesity due to excess calories: Secondary | ICD-10-CM

## 2018-10-19 DIAGNOSIS — E6609 Other obesity due to excess calories: Secondary | ICD-10-CM | POA: Diagnosis not present

## 2018-10-19 MED ORDER — HYDROCHLOROTHIAZIDE 25 MG PO TABS: 25.0000 mg | ORAL_TABLET | Freq: Every day | ORAL | 2 refills | Status: DC

## 2018-10-19 MED ORDER — ASPIRIN EC 325 MG PO TBEC: 325.0000 mg | DELAYED_RELEASE_TABLET | Freq: Every day | ORAL | 0 refills | Status: DC

## 2018-10-19 MED ORDER — VALACYCLOVIR HCL 1 G PO TABS: 1000.0000 mg | ORAL_TABLET | Freq: Two times a day (BID) | ORAL | 1 refills | Status: DC

## 2018-10-19 MED ORDER — BENAZEPRIL HCL 20 MG PO TABS: ORAL_TABLET | ORAL | 2 refills | Status: DC

## 2018-10-19 NOTE — Progress Notes (Signed)
   Subjective:    Patient ID: [name], female    DOB: [birth year], 62 y.o.   MRN: [id number]  Patient presents for Follow-up (HTN- is not fasting)   Here to follow-up chronic medical problems.  Medications reviewed. Hypertension-she is currently on benazepril / HCTZ home readings  CBG 120-130/ 70-80's  Diabetes her last A1c 5.9% in Feb 2019  GERD she is on Nexium ever other day  by gastroenterology, has appt on Monday with [name]  She is due for fasting labs has not had a lipid panel  Vitamin D  deficiency is being treated by her GYN  She has had a few fever blisters with stressors, currently on valtrex   Now on 1/2 tablet of estrogen by GYN, changed in Feb , she did have a couple episodes where she felt a fluttering after her estrogen was decreased she did not have any substernal pain, no radiating symptoms, no SOB, had mild hot flashes with it Nothing recently- advised if this reoccur to let me know, and would do cardiac workup   Taking red yeast and krill oil for cholesterol     Review Of Systems:  GEN- denies fatigue, fever, weight loss,weakness, recent illness HEENT- denies eye drainage, change in vision, nasal discharge, CVS- denies chest pain, palpitations RESP- denies SOB, cough, wheeze ABD- denies N/V, change in stools, abd pain GU- denies dysuria, hematuria, dribbling, incontinence MSK- denies joint pain, muscle aches, injury Neuro- denies headache, dizziness, syncope, seizure activity       Objective:    BP 128/74   Pulse 88   Temp 97.9 F (36.6 C) (Rectal)   Resp 12   Ht 5\' 5"  (1.651 m)   Wt 182 lb (82.6 kg)   LMP 04/27/1992   SpO2 100%   BMI 30.29 kg/m  GEN- NAD, alert and oriented x3 HEENT- PERRL, EOMI, non injected sclera, pink conjunctiva, MMM, oropharynx clear, skin fever blister upper lip Neck- Supple, no thyromegaly CVS- RRR, no murmur RESP-CTAB ABD-NABS,soft,NT,ND EXT- No edema Pulses- Radial, DP- 2+        Assessment &  Plan:      Problem List Items Addressed This Visit      Unprioritized   GERD (gastroesophageal reflux disease)    nexium QOD, f/u with GI next week      Glucose intolerance   Relevant Orders   Hemoglobin A1c   Herpes simplex type 1 infection    Valtrex as needed      Relevant Medications   valACYclovir (VALTREX) 1000 MG tablet   Hypertension - Primary    Well controlled, no changes to meds Non fasting labs obtained today       Relevant Medications   aspirin EC 325 MG tablet   Other Relevant Orders   Comprehensive metabolic panel   CBC with Differential/Platelet   Lipid panel   TSH   Obesity    She continues to work on dietary changes, has maintained weight during COVID-19         Note: This dictation was prepared with Diplomatic Services operational officer dictation along with smaller Company secretary. Any transcriptional errors that result from this process are unintentional.

## 2018-10-19 NOTE — Patient Instructions (Signed)
F/U 6 months for Physical  

## 2018-10-19 NOTE — Assessment & Plan Note (Signed)
Valtrex as needed 

## 2018-10-19 NOTE — Assessment & Plan Note (Signed)
nexium QOD, f/u with GI next week

## 2018-10-19 NOTE — Assessment & Plan Note (Signed)
She continues to work on dietary changes, has maintained weight during COVID-19

## 2018-10-19 NOTE — Assessment & Plan Note (Signed)
Well controlled, no changes to meds Non fasting labs obtained today

## 2018-10-20 ENCOUNTER — Ambulatory Visit
Admission: RE | Admit: 2018-10-20 | Discharge: 2018-10-20 | Disposition: A | Discharge status: Home / Self Care | Payer: 59 | Source: Ambulatory Visit | Attending: Family Medicine | Admitting: Family Medicine

## 2018-10-20 ENCOUNTER — Telehealth: Payer: Self-pay | Admitting: *Deleted

## 2018-10-20 DIAGNOSIS — Z1231 Encounter for screening mammogram for malignant neoplasm of breast: Secondary | ICD-10-CM

## 2018-10-20 MED ORDER — ACYCLOVIR 400 MG PO TABS: 400.0000 mg | ORAL_TABLET | Freq: Three times a day (TID) | ORAL | 1 refills | Status: DC

## 2018-10-20 NOTE — Telephone Encounter (Signed)
Received fax requesting alternative to Valtrex as medication is not covered by insurance.   Preferred alternative is Acyclovir.   MD please advise.

## 2018-10-20 NOTE — Telephone Encounter (Signed)
Call placed to patient and patient made aware.   States that she just picked up Valtrex about a week prior.   Will use Acyclovir id needed.

## 2018-10-20 NOTE — Telephone Encounter (Signed)
Okay to change to acyclovir  400mg  TID for cold sores as needed  #30 R 1

## 2018-10-21 ENCOUNTER — Encounter: Payer: Self-pay | Admitting: *Deleted

## 2018-10-21 LAB — CBC WITH DIFFERENTIAL/PLATELET
Absolute Monocytes: 461 cells/uL (ref 200–950)
Basophils Absolute: 49 cells/uL (ref 0–200)
Basophils Relative: 1 %
Eosinophils Absolute: 103 cells/uL (ref 15–500)
Eosinophils Relative: 2.1 %
HCT: 41.8 % (ref 35.0–45.0)
Hemoglobin: 13.6 g/dL (ref 11.7–15.5)
Lymphs Abs: 1421 cells/uL (ref 850–3900)
MCH: 27.6 pg (ref 27.0–33.0)
MCHC: 32.5 g/dL (ref 32.0–36.0)
MCV: 84.8 fL (ref 80.0–100.0)
MPV: 11.9 fL (ref 7.5–12.5)
Monocytes Relative: 9.4 %
Neutro Abs: 2867 cells/uL (ref 1500–7800)
Neutrophils Relative %: 58.5 %
Platelets: 258 10*3/uL (ref 140–400)
RBC: 4.93 10*6/uL (ref 3.80–5.10)
RDW: 13.4 % (ref 11.0–15.0)
Total Lymphocyte: 29 %
WBC: 4.9 10*3/uL (ref 3.8–10.8)

## 2018-10-21 LAB — TSH: TSH: 0.59 mIU/L (ref 0.40–4.50)

## 2018-10-21 LAB — COMPREHENSIVE METABOLIC PANEL
AG Ratio: 2.1 (calc) (ref 1.0–2.5)
ALT: 9 U/L (ref 6–29)
AST: 14 U/L (ref 10–35)
Albumin: 4.4 g/dL (ref 3.6–5.1)
Alkaline phosphatase (APISO): 47 U/L (ref 37–153)
BUN: 15 mg/dL (ref 7–25)
CO2: 26 mmol/L (ref 20–32)
Calcium: 9.5 mg/dL (ref 8.6–10.4)
Chloride: 105 mmol/L (ref 98–110)
Creat: 0.62 mg/dL (ref 0.50–0.99)
Globulin: 2.1 g/dL (calc) (ref 1.9–3.7)
Glucose, Bld: 99 mg/dL (ref 65–99)
Potassium: 3.8 mmol/L (ref 3.5–5.3)
Sodium: 140 mmol/L (ref 135–146)
Total Bilirubin: 0.5 mg/dL (ref 0.2–1.2)
Total Protein: 6.5 g/dL (ref 6.1–8.1)

## 2018-10-21 LAB — LIPID PANEL
Cholesterol: 220 mg/dL — ABNORMAL HIGH (ref <200)
HDL: 54 mg/dL (ref >=50)
LDL Cholesterol (Calc): 140 mg/dL (calc) — ABNORMAL HIGH
Non-HDL Cholesterol (Calc): 166 mg/dL (calc) — ABNORMAL HIGH (ref <130)
Total CHOL/HDL Ratio: 4.1 (calc) (ref <5.0)
Triglycerides: 139 mg/dL (ref <150)

## 2018-10-21 LAB — HEMOGLOBIN A1C
Hgb A1c MFr Bld: 5.8 % of total Hgb — ABNORMAL HIGH (ref <5.7)
Mean Plasma Glucose: 120 (calc)
eAG (mmol/L): 6.6 (calc)

## 2018-11-25 ENCOUNTER — Other Ambulatory Visit: Payer: 59

## 2018-11-25 ENCOUNTER — Other Ambulatory Visit: Payer: Self-pay

## 2018-11-25 DIAGNOSIS — Z20822 Contact with and (suspected) exposure to covid-19: Secondary | ICD-10-CM

## 2018-11-27 LAB — NOVEL CORONAVIRUS, NAA: SARS-CoV-2, NAA: NOT DETECTED

## 2018-12-20 ENCOUNTER — Other Ambulatory Visit: Payer: Self-pay | Admitting: Obstetrics & Gynecology

## 2018-12-20 MED ORDER — ESTRADIOL 0.5 MG PO TABS: 0.5000 mg | ORAL_TABLET | Freq: Every day | ORAL | 1 refills | Status: DC

## 2018-12-20 MED ORDER — MEDROXYPROGESTERONE ACETATE 2.5 MG PO TABS: 2.5000 mg | ORAL_TABLET | Freq: Every day | ORAL | 1 refills | Status: DC

## 2018-12-20 NOTE — Telephone Encounter (Signed)
Medication refill request: estradiol 0.5mg   Last AEX:  03/14/18 SM Next AEX: 04/03/19  Last MMG (if hormonal medication request): 10/20/18 [id number]  Refill authorized: please advise.

## 2018-12-20 NOTE — Telephone Encounter (Signed)
Patient is ready to refill her estraiol prescription. [facility] [address] [place], [place] . She would like to change to 0.5 mg.

## 2019-03-20 ENCOUNTER — Other Ambulatory Visit: Payer: Self-pay

## 2019-03-21 ENCOUNTER — Encounter: Payer: Self-pay | Admitting: Family Medicine

## 2019-03-21 ENCOUNTER — Ambulatory Visit: Payer: 59 | Admitting: Family Medicine

## 2019-03-21 VITALS — BP 134/78 | HR 80 | Temp 97.4°F | Resp 16 | Ht 65.0 in | Wt 152.0 lb

## 2019-03-21 DIAGNOSIS — E7439 Other disorders of intestinal carbohydrate absorption: Secondary | ICD-10-CM

## 2019-03-21 DIAGNOSIS — K219 Gastro-esophageal reflux disease without esophagitis: Secondary | ICD-10-CM

## 2019-03-21 DIAGNOSIS — I10 Essential (primary) hypertension: Secondary | ICD-10-CM | POA: Diagnosis not present

## 2019-03-21 DIAGNOSIS — E559 Vitamin D deficiency, unspecified: Secondary | ICD-10-CM

## 2019-03-21 MED ORDER — VITAMIN D3 125 MCG (5000 UT) PO CAPS: ORAL_CAPSULE | ORAL | 0 refills | Status: DC

## 2019-03-21 MED ORDER — ACYCLOVIR 400 MG PO TABS: 400.0000 mg | ORAL_TABLET | Freq: Three times a day (TID) | ORAL | 1 refills | Status: DC

## 2019-03-21 NOTE — Assessment & Plan Note (Signed)
With her weight loss her blood pressures look great.  I am going to discontinue the hydrochlorothiazide.  She will continue the benazepril 20 mg.  We will follow-up by phone in 4 weeks with her blood pressure readings goal will be to decrease her benazepril if she is still gaining average blood pressures around 120/70-80.  We will check her metabolic panel today as well as A1c though expect this to be normal.  Her lipids were also mildly elevated at her last check we will recheck this today.

## 2019-03-21 NOTE — Assessment & Plan Note (Signed)
She is on vitamin D replacement.

## 2019-03-21 NOTE — Progress Notes (Signed)
   Subjective:    Patient ID: [name], female    DOB: 12-Jan-1956, 63 y.o.   MRN: [id number]  Patient presents for Medication Management (Pt fasting) and Medication Refill Patient here to follow-up chronic medical problems.  She has intentionally lost 30 pounds in the past 4 months.  She is worried about developing diabetes mellitus in the setting of her obesity and decided to make a change.  She is doing low-carb diet she is also doing extensive cardio on the elliptical as well as other exercises.  Her home readings have ranged from HTN- BP 106-130/70-80.  Recently her systolic blood pressure has been less than 120.  Often if she takes her blood pressure medication and checks again she has had readings down to 683-419 systolic she has not had any dizziness chest pain shortness of breath.  GERD- taking nexium 20mg  every other day     Taking Occuvite for eyes - Dr. Luretha Rued [facility]   Borderline diabetes mellitus her last A1c was 5.8% she is due for recheck today.  Still followed by her gynecologist and is on hormone therapy.   Review Of Systems:  GEN- denies fatigue, fever, weight loss,weakness, recent illness HEENT- denies eye drainage, change in vision, nasal discharge, CVS- denies chest pain, palpitations RESP- denies SOB, cough, wheeze ABD- denies N/V, change in stools, abd pain GU- denies dysuria, hematuria, dribbling, incontinence MSK- denies joint pain, muscle aches, injury Neuro- denies headache, dizziness, syncope, seizure activity       Objective:    BP 134/78   Pulse 80   Temp (!) 97.4 F (36.3 C) (Other (Comment))   Resp 16   Ht 5\' 5"  (1.651 m)   Wt 152 lb (68.9 kg)   LMP 04/27/1992   SpO2 98%   BMI 25.29 kg/m  GEN- NAD, alert and oriented x3 HEENT- PERRL, EOMI, non injected sclera, pink conjunctiva, MMM, oropharynx clear Neck- Supple, no thyromegaly CVS- RRR, no murmur RESP-CTAB ABD-NABS,soft,NT,ND EXT- No edema Pulses- Radial, DP-  2+        Assessment & Plan:      Problem List Items Addressed This Visit      Unprioritized   GERD (gastroesophageal reflux disease)   Glucose intolerance   Relevant Orders   Hemoglobin A1c   Hypertension - Primary    With her weight loss her blood pressures look great.  I am going to discontinue the hydrochlorothiazide.  She will continue the benazepril 20 mg.  We will follow-up by phone in 4 weeks with her blood pressure readings goal will be to decrease her benazepril if she is still gaining average blood pressures around 120/70-80.  We will check her metabolic panel today as well as A1c though expect this to be normal.  Her lipids were also mildly elevated at her last check we will recheck this today.      Relevant Orders   CBC with Differential   Comprehensive metabolic panel   Lipid Panel   Vitamin D deficiency    She is on vitamin D replacement.         Note: This dictation was prepared with Dragon dictation along with smaller phrase technology. Any transcriptional errors that result from this process are unintentional.

## 2019-03-21 NOTE — Patient Instructions (Addendum)
  F/u 4 weeks for tele health for blood pressure

## 2019-03-22 ENCOUNTER — Encounter: Payer: Self-pay | Admitting: *Deleted

## 2019-03-22 ENCOUNTER — Encounter: Payer: Self-pay | Admitting: Family Medicine

## 2019-03-22 DIAGNOSIS — E785 Hyperlipidemia, unspecified: Secondary | ICD-10-CM | POA: Insufficient documentation

## 2019-03-22 LAB — LIPID PANEL
Cholesterol: 225 mg/dL — ABNORMAL HIGH (ref <200)
HDL: 61 mg/dL (ref >=50)
LDL Cholesterol (Calc): 149 mg/dL (calc) — ABNORMAL HIGH
Non-HDL Cholesterol (Calc): 164 mg/dL (calc) — ABNORMAL HIGH (ref <130)
Total CHOL/HDL Ratio: 3.7 (calc) (ref <5.0)
Triglycerides: 59 mg/dL (ref <150)

## 2019-03-22 LAB — CBC WITH DIFFERENTIAL/PLATELET
Absolute Monocytes: 501 cells/uL (ref 200–950)
Basophils Absolute: 60 cells/uL (ref 0–200)
Basophils Relative: 1.3 %
Eosinophils Absolute: 51 cells/uL (ref 15–500)
Eosinophils Relative: 1.1 %
HCT: 43.4 % (ref 35.0–45.0)
Hemoglobin: 14.2 g/dL (ref 11.7–15.5)
Lymphs Abs: 1297 cells/uL (ref 850–3900)
MCH: 27.3 pg (ref 27.0–33.0)
MCHC: 32.7 g/dL (ref 32.0–36.0)
MCV: 83.3 fL (ref 80.0–100.0)
MPV: 11.7 fL (ref 7.5–12.5)
Monocytes Relative: 10.9 %
Neutro Abs: 2691 cells/uL (ref 1500–7800)
Neutrophils Relative %: 58.5 %
Platelets: 230 10*3/uL (ref 140–400)
RBC: 5.21 10*6/uL — ABNORMAL HIGH (ref 3.80–5.10)
RDW: 13.3 % (ref 11.0–15.0)
Total Lymphocyte: 28.2 %
WBC: 4.6 10*3/uL (ref 3.8–10.8)

## 2019-03-22 LAB — COMPREHENSIVE METABOLIC PANEL
AG Ratio: 2.1 (calc) (ref 1.0–2.5)
ALT: 7 U/L (ref 6–29)
AST: 12 U/L (ref 10–35)
Albumin: 4.6 g/dL (ref 3.6–5.1)
Alkaline phosphatase (APISO): 44 U/L (ref 37–153)
BUN: 14 mg/dL (ref 7–25)
CO2: 31 mmol/L (ref 20–32)
Calcium: 9.6 mg/dL (ref 8.6–10.4)
Chloride: 101 mmol/L (ref 98–110)
Creat: 0.64 mg/dL (ref 0.50–0.99)
Globulin: 2.2 g/dL (calc) (ref 1.9–3.7)
Glucose, Bld: 107 mg/dL — ABNORMAL HIGH (ref 65–99)
Potassium: 3.9 mmol/L (ref 3.5–5.3)
Sodium: 140 mmol/L (ref 135–146)
Total Bilirubin: 0.6 mg/dL (ref 0.2–1.2)
Total Protein: 6.8 g/dL (ref 6.1–8.1)

## 2019-03-22 LAB — HEMOGLOBIN A1C
Hgb A1c MFr Bld: 5.8 % of total Hgb — ABNORMAL HIGH (ref <5.7)
Mean Plasma Glucose: 120 (calc)
eAG (mmol/L): 6.6 (calc)

## 2019-03-28 ENCOUNTER — Other Ambulatory Visit: Payer: Self-pay | Admitting: Obstetrics & Gynecology

## 2019-03-31 ENCOUNTER — Other Ambulatory Visit: Payer: Self-pay

## 2019-04-03 ENCOUNTER — Other Ambulatory Visit: Payer: Self-pay

## 2019-04-03 ENCOUNTER — Ambulatory Visit: Payer: 59 | Admitting: Obstetrics & Gynecology

## 2019-04-03 ENCOUNTER — Encounter: Payer: Self-pay | Admitting: Obstetrics & Gynecology

## 2019-04-03 VITALS — BP 140/72 | HR 76 | Temp 97.2°F | Resp 12 | Ht 64.75 in | Wt 153.0 lb

## 2019-04-03 DIAGNOSIS — E559 Vitamin D deficiency, unspecified: Secondary | ICD-10-CM

## 2019-04-03 DIAGNOSIS — Z01419 Encounter for gynecological examination (general) (routine) without abnormal findings: Secondary | ICD-10-CM

## 2019-04-03 DIAGNOSIS — E2839 Other primary ovarian failure: Secondary | ICD-10-CM | POA: Diagnosis not present

## 2019-04-03 MED ORDER — MEDROXYPROGESTERONE ACETATE 2.5 MG PO TABS: 2.5000 mg | ORAL_TABLET | Freq: Every day | ORAL | 4 refills | Status: DC

## 2019-04-03 MED ORDER — ESTRADIOL 0.5 MG PO TABS: 0.5000 mg | ORAL_TABLET | Freq: Every day | ORAL | 4 refills | Status: DC

## 2019-04-03 NOTE — Progress Notes (Signed)
63 y.o. G2P2 Married White or Caucasian female here for annual exam.  Has been working on weight loss.  She's lost close to 30 pounds.  Father died in a MVA this summer.  She was around her sisters more as a result of this.  She realized how unhealthy they all were as they are all type 2 diabetic.  She decided to work on her weight and not get in a position where weight was contributing to her health.  She's like to lose 5 more pounds.  She's watching her sugar intake.    Denies vaginal bleeding.     She has come off one of her blood pressure medications.    Patient's last menstrual period was 04/27/1992.          Sexually active: Yes.    The current method of family planning is status post hysterectomy.    Exercising: Yes.    3-4 miles on elliptical, cardio kickboxing, treadmill Smoker:  no  Health Maintenance: Pap:  10/22/10 Normal  History of abnormal Pap:  no MMG:  10/20/18 BIRADS 1 negative/density b Colonoscopy:  09/28/12 f/u 10 years BMD:   never TDaP:  11/25/17 Pneumonia vaccine(s):  none Shingrix:   Zoster 07/23/17 Hep C testing: 01/23/16  Screening Labs: PCP   reports that she has never smoked. She has never used smokeless tobacco. She reports current alcohol use of about 2.0 - 3.0 standard drinks of alcohol per week. She reports that she does not use drugs.  Past Medical History:  Diagnosis Date  . Broken ankle 1/16   will have surgery on 01/30/15-right ankle  . Elevated hemoglobin A1c 01/17/15   6.0  . Endometriosis   . Herpes simplex type 1 infection   . Hypertension   . Migraine    h/o migraines, none since hysterectomy  . Varicose vein    surgery on right leg    Past Surgical History:  Procedure Laterality Date  . ABDOMINAL HYSTERECTOMY  1994   LAVH/RSO  . ANKLE SURGERY Right 01/30/2015  . BREAST BIOPSY Left   . CESAREAN SECTION    . CHOLECYSTECTOMY    . COLON RESECTION  11/06   and lap LSO  . CYSTECTOMY    . HERNIA REPAIR  1982  . HERNIA REPAIR  2003   and  bladder repair  . SHOULDER SURGERY Right 04/2017   Tendon repair     Current Outpatient Medications  Medication Sig Dispense Refill  . acyclovir (ZOVIRAX) 400 MG tablet Take 1 tablet (400 mg total) by mouth 3 (three) times daily. 30 tablet 1  . aspirin EC 325 MG tablet Take 1 tablet (325 mg total) by mouth daily. 30 tablet 0  . benazepril (LOTENSIN) 20 MG tablet TAKE 1 TABLET(20 MG) BY MOUTH DAILY 90 tablet 2  . CALCIUM PO Take by mouth daily.    . Cholecalciferol (VITAMIN D3) 125 MCG (5000 UT) CAPS 1 capsule daily 30 capsule 0  . esomeprazole (NEXIUM) 40 MG capsule Take 1 capsule (40 mg total) by mouth daily. 30 capsule 0  . estradiol (ESTRACE) 0.5 MG tablet Take 1 tablet (0.5 mg total) by mouth daily. 90 tablet 1  . fluticasone (FLONASE) 50 MCG/ACT nasal spray Place 2 sprays into both nostrils daily. 16 g 6  . KRILL OIL PO Take by mouth. Omega red krill oil    . medroxyPROGESTERone (PROVERA) 2.5 MG tablet Take 1 tablet (2.5 mg total) by mouth daily. 90 tablet 1  . Red Yeast Rice  Extract (RED YEAST RICE PO) Take by mouth daily.    . vitamin C (ASCORBIC ACID) 500 MG tablet Take 1,000 mg by mouth 3 (three) times daily.     No current facility-administered medications for this visit.     Family History  Problem Relation Age of Onset  . Skin cancer Other   . Lung cancer Father   . Spina bifida Daughter        and hydrocephalia  . Diabetes Mother   . Hypertension Mother   . Thyroid nodules Mother        being watched  . Diabetes Sister        x 3  . Hypertension Sister   . Thyroid nodules Sister     Review of Systems  All other systems reviewed and are negative.   Exam:   BP 140/72 (BP Location: Left Arm, Patient Position: Sitting, Cuff Size: Normal)   Pulse 76   Temp (!) 97.2 F (36.2 C) (Temporal)   Resp 12   Ht 5' 4.75" (1.645 m)   Wt 153 lb (69.4 kg)   LMP 04/27/1992   BMI 25.66 kg/m    Height: 5' 4.75" (164.5 cm)  Ht Readings from Last 3 Encounters:  04/03/19  5' 4.75" (1.645 m)  03/21/19 5\' 5"  (1.651 m)  10/19/18 5\' 5"  (1.651 m)    General appearance: alert, cooperative and appears stated age Head: Normocephalic, without obvious abnormality, atraumatic Neck: no adenopathy, supple, symmetrical, trachea midline and thyroid normal to inspection and palpation Lungs: clear to auscultation bilaterally Breasts: normal appearance, no masses or tenderness Heart: regular rate and rhythm Abdomen: soft, non-tender; bowel sounds normal; no masses,  no organomegaly Extremities: extremities normal, atraumatic, no cyanosis or edema Skin: Skin color, texture, turgor normal. No rashes or lesions Lymph nodes: Cervical, supraclavicular, and axillary nodes normal. No abnormal inguinal nodes palpated Neurologic: Grossly normal   Pelvic: External genitalia:  no lesions              Urethra:  normal appearing urethra with no masses, tenderness or lesions              Bartholins and Skenes: normal                 Vagina: normal appearing vagina with normal color and discharge, no lesions              Cervix: absent              Pap taken: No. Bimanual Exam:  Uterus:  uterus absent              Adnexa: normal adnexa and no mass, fullness, tenderness               Rectovaginal: Confirms               Anus:  normal sphincter tone, no lesions  Chaperone was present for exam.  A:  Well Woman with normal exam PMP, on HRT H/o LAVH/RSO, then later LSO due to endometrium Hypertension Fever blister GERD H/o low Vit D GERD Weight loss, purposeful   P:   Mammogram guidelines reviewed  Her is UTD. pap smear not indicated Blood work was last done in November Order for BMD placed for 09/2019 to do with MMG Vit D ordered today RF for estradiol 0.5mg  daily and provera 2.5mg  daily.  #90/4RF Colonoscopy UTD Return annually or prn

## 2019-04-04 LAB — VITAMIN D 25 HYDROXY (VIT D DEFICIENCY, FRACTURES): Vit D, 25-Hydroxy: 37.5 ng/mL (ref 30.0–100.0)

## 2019-04-05 ENCOUNTER — Other Ambulatory Visit: Payer: Self-pay | Admitting: Obstetrics & Gynecology

## 2019-04-05 DIAGNOSIS — Z1231 Encounter for screening mammogram for malignant neoplasm of breast: Secondary | ICD-10-CM

## 2019-04-24 ENCOUNTER — Other Ambulatory Visit: Payer: Self-pay

## 2019-04-24 ENCOUNTER — Ambulatory Visit (INDEPENDENT_AMBULATORY_CARE_PROVIDER_SITE_OTHER): Payer: 59 | Admitting: Family Medicine

## 2019-04-24 ENCOUNTER — Encounter: Payer: Self-pay | Admitting: Family Medicine

## 2019-04-24 DIAGNOSIS — J0100 Acute maxillary sinusitis, unspecified: Secondary | ICD-10-CM

## 2019-04-24 DIAGNOSIS — I10 Essential (primary) hypertension: Secondary | ICD-10-CM

## 2019-04-24 DIAGNOSIS — J302 Other seasonal allergic rhinitis: Secondary | ICD-10-CM

## 2019-04-24 MED ORDER — AMOXICILLIN 875 MG PO TABS: 875.0000 mg | ORAL_TABLET | Freq: Two times a day (BID) | ORAL | 0 refills | Status: DC

## 2019-04-24 MED ORDER — FLUTICASONE PROPIONATE 50 MCG/ACT NA SUSP: 2.0000 | Freq: Every day | NASAL | 6 refills | Status: DC

## 2019-04-24 NOTE — Progress Notes (Signed)
Virtual Visit via Telephone Note  I connected with [name] on 04/24/19 at 12:09am by telephone and verified that I am speaking with the correct person using two identifiers.      Pt location: at home   Physician location:  In office, [facility], Vic Blackbird MD     On call: patient and physician   I discussed the limitations, risks, security and privacy concerns of performing an evaluation and management service by telephone and the availability of in person appointments. I also discussed with the patient that there may be a patient responsible charge related to this service. The patient expressed understanding and agreed to proceed.   History of Present Illness: Telehealth visit to F/U blood pressure readings.  Her last visit back in November I discontinued her hydrochlorothiazide.  She had intentionally lost 30 pounds was working out regularly and has changed her diet.  Her blood pressure was on the low normal side.  She was continued on benazepril 20 mg.  Her blood pressure readings until recently when she developed sinus pressure have ranged from 10 9-1 20s over 70s to 80s.  Before her prescription her blood pressure has been 140 over 80s.  The past few days her blood pressures range from 136-151/84-86 but she admits that she was taking Motrin sinus over-the-counter.  She has history of allergic rhinitis and chronic sinusitis.  She ran out of her Flonase during the holidays is typically keeps her sinus issues at [place].  She has not had any significant drainage but does have pressure around the eyes and the maxillary sinus region.  Has not had any fever no cough or congestion.   Observations/Objective: Unable to visualize no acute distress noted over this time.  Assessment and Plan:  Hypertension overall her blood pressures have looked fairly well until she developed her recent illness.  Also think there is some elevation secondary to the female (that is in her sinus  medication.  We will have her continue to monitor her blood pressure off of the decongestion.  If her blood pressure is on average staying below 120/80 we will have her decrease the benazepril 20 mg once a day.  She will let my office when she does make this change.  With regards to the acute sinusitis she does have underlying allergies.  We will restart Flonase/ can also use nasal saline, wI will also have her use Mucinex and stop the decongestant for now.  I went ahead and gave her amoxicillin to have on hand in case she does develop purulent drainage along with her symptoms based on her history. Follow Up Instructions:    I discussed the assessment and treatment plan with the patient. The patient was provided an opportunity to ask questions and all were answered. The patient agreed with the plan and demonstrated an understanding of the instructions.   The patient was advised to call back or seek an in-person evaluation if the symptoms worsen or if the condition fails to improve as anticipated.  I provided  9  minutes of non-face-to-face time during this encounter. End TIME 12:18AM   Vic Blackbird, MD

## 2019-04-25 ENCOUNTER — Encounter: Payer: 59 | Admitting: Family Medicine

## 2019-05-19 ENCOUNTER — Ambulatory Visit: Payer: 59 | Admitting: Family Medicine

## 2019-05-19 ENCOUNTER — Encounter: Payer: Self-pay | Admitting: Family Medicine

## 2019-05-19 ENCOUNTER — Other Ambulatory Visit: Payer: Self-pay

## 2019-05-19 VITALS — BP 120/80 | HR 96 | Temp 98.7°F | Resp 18 | Ht 64.75 in | Wt 154.0 lb

## 2019-05-19 DIAGNOSIS — K5792 Diverticulitis of intestine, part unspecified, without perforation or abscess without bleeding: Secondary | ICD-10-CM | POA: Diagnosis not present

## 2019-05-19 LAB — COMPREHENSIVE METABOLIC PANEL
AG Ratio: 1.6 (calc) (ref 1.0–2.5)
ALT: 13 U/L (ref 6–29)
AST: 13 U/L (ref 10–35)
Albumin: 4.1 g/dL (ref 3.6–5.1)
Alkaline phosphatase (APISO): 53 U/L (ref 37–153)
BUN: 9 mg/dL (ref 7–25)
CO2: 29 mmol/L (ref 20–32)
Calcium: 9.3 mg/dL (ref 8.6–10.4)
Chloride: 104 mmol/L (ref 98–110)
Creat: 0.63 mg/dL (ref 0.50–0.99)
Globulin: 2.5 g/dL (calc) (ref 1.9–3.7)
Glucose, Bld: 124 mg/dL — ABNORMAL HIGH (ref 65–99)
Potassium: 4.4 mmol/L (ref 3.5–5.3)
Sodium: 141 mmol/L (ref 135–146)
Total Bilirubin: 0.6 mg/dL (ref 0.2–1.2)
Total Protein: 6.6 g/dL (ref 6.1–8.1)

## 2019-05-19 LAB — CBC WITH DIFFERENTIAL/PLATELET
Absolute Monocytes: 1397 cells/uL — ABNORMAL HIGH (ref 200–950)
Basophils Absolute: 41 cells/uL (ref 0–200)
Basophils Relative: 0.3 %
Eosinophils Absolute: 41 cells/uL (ref 15–500)
Eosinophils Relative: 0.3 %
HCT: 41.6 % (ref 35.0–45.0)
Hemoglobin: 13.3 g/dL (ref 11.7–15.5)
Lymphs Abs: 1192 cells/uL (ref 850–3900)
MCH: 27.4 pg (ref 27.0–33.0)
MCHC: 32 g/dL (ref 32.0–36.0)
MCV: 85.8 fL (ref 80.0–100.0)
MPV: 11.8 fL (ref 7.5–12.5)
Monocytes Relative: 10.2 %
Neutro Abs: 11029 cells/uL — ABNORMAL HIGH (ref 1500–7800)
Neutrophils Relative %: 80.5 %
Platelets: 218 10*3/uL (ref 140–400)
RBC: 4.85 10*6/uL (ref 3.80–5.10)
RDW: 13 % (ref 11.0–15.0)
Total Lymphocyte: 8.7 %
WBC: 13.7 10*3/uL — ABNORMAL HIGH (ref 3.8–10.8)

## 2019-05-19 MED ORDER — METRONIDAZOLE 500 MG PO TABS: 500.0000 mg | ORAL_TABLET | Freq: Three times a day (TID) | ORAL | 0 refills | Status: DC

## 2019-05-19 MED ORDER — CIPROFLOXACIN HCL 500 MG PO TABS: 500.0000 mg | ORAL_TABLET | Freq: Two times a day (BID) | ORAL | 0 refills | Status: DC

## 2019-05-19 MED ORDER — HYDROCODONE-ACETAMINOPHEN 5-325 MG PO TABS: 1.0000 | ORAL_TABLET | Freq: Four times a day (QID) | ORAL | 0 refills | Status: DC | PRN

## 2019-05-19 NOTE — Progress Notes (Signed)
   Subjective:    Patient ID: [name], female    DOB: October 06, 1955, 64 y.o.   MRN: [id number]  Patient presents for Constipation (started 01/19, lower L side, cramping, bloated, metamucil was taken with no relief, fiber intake is not working )    Pt here with abd pain since Tuesday evening.  bloating, cramping,sharp LLQ pain   She has had very small bowel movements She is passing small amount of gas  Has history of diverticulitis as well as partial colon resection in the past   she has had a few spells of abd pain on and off that have lasted a day or two and usually eases up without any intervention but this current one is more severe than her usual twinges of pain   She has tried sennakot gummies  And Metamucil to move her bowels   She does recall she has been eating more  Peanuts recently which may have contributed     No fever, no vomiting   Meds, history reviewed   Review Of Systems:  GEN- denies fatigue, fever, weight loss,weakness, recent illness HEENT- denies eye drainage, change in vision, nasal discharge, CVS- denies chest pain, palpitations RESP- denies SOB, cough, wheeze ABD- denies N/V, change in stools, +abd pain GU- denies dysuria, hematuria, dribbling, incontinence MSK- denies joint pain, muscle aches, injury Neuro- denies headache, dizziness, syncope, seizure activity       Objective:    BP 120/80 (BP Location: Right Arm, Patient Position: Sitting, Cuff Size: Normal)   Pulse 96   Temp 98.7 F (37.1 C) (Oral)   Resp 18   Ht 5' 4.75" (1.645 m)   Wt 154 lb (69.9 kg)   LMP 04/27/1992   SpO2 98%   BMI 25.83 kg/m  GEN- NAD, alert and oriented x3 HEENT- PERRL, EOMI, non injected sclera, pink conjunctiva  CVS- RRR, no murmur RESP-CTAB ABD-NABS,soft,ND, TTP LLQ, no rebound, no guarding  EXT- No edema Pulses- Radial  2+        Assessment & Plan:      Problem List Items Addressed This Visit    None    Visit Diagnoses    Acute diverticulitis     -  Primary   STAT Labs shows elevated WBC  13,000, start Cipro and fLAGYL, GIVEN NORCO for pain, clear fluids for 24 hours, If pain worsens despite meds, needs urgent CT   Relevant Medications   HYDROcodone-acetaminophen (NORCO) 5-325 MG tablet   ciprofloxacin (CIPRO) 500 MG tablet   metroNIDAZOLE (FLAGYL) 500 MG tablet   Other Relevant Orders   CBC with Differential/Platelet (Completed)   Comprehensive metabolic panel (Completed)      Note: This dictation was prepared with Dragon dictation along with smaller phrase technology. Any transcriptional errors that result from this process are unintentional.

## 2019-05-19 NOTE — Patient Instructions (Addendum)
F/U as previous  We will call with lab results Start your antibiotics as prescribed

## 2019-07-18 ENCOUNTER — Ambulatory Visit: Payer: 59 | Admitting: Obstetrics & Gynecology

## 2019-07-26 ENCOUNTER — Other Ambulatory Visit: Payer: Self-pay | Admitting: Family Medicine

## 2019-10-23 ENCOUNTER — Ambulatory Visit
Admission: RE | Admit: 2019-10-23 | Discharge: 2019-10-23 | Disposition: A | Discharge status: Home / Self Care | Payer: 59 | Source: Ambulatory Visit | Attending: Obstetrics & Gynecology | Admitting: Obstetrics & Gynecology

## 2019-10-23 ENCOUNTER — Other Ambulatory Visit: Payer: Self-pay

## 2019-10-23 DIAGNOSIS — E2839 Other primary ovarian failure: Secondary | ICD-10-CM

## 2019-10-23 DIAGNOSIS — Z1231 Encounter for screening mammogram for malignant neoplasm of breast: Secondary | ICD-10-CM

## 2020-02-20 ENCOUNTER — Ambulatory Visit
Admission: EM | Admit: 2020-02-20 | Discharge: 2020-02-20 | Disposition: A | Discharge status: Home / Self Care | Payer: 59 | Attending: Emergency Medicine | Admitting: Emergency Medicine

## 2020-02-20 ENCOUNTER — Other Ambulatory Visit: Payer: Self-pay

## 2020-02-20 ENCOUNTER — Encounter: Payer: Self-pay | Admitting: Emergency Medicine

## 2020-02-20 DIAGNOSIS — J029 Acute pharyngitis, unspecified: Secondary | ICD-10-CM | POA: Diagnosis not present

## 2020-02-20 LAB — POCT RAPID STREP A (OFFICE): Rapid Strep A Screen: NEGATIVE

## 2020-02-20 MED ORDER — AMOXICILLIN-POT CLAVULANATE 875-125 MG PO TABS: 1.0000 | ORAL_TABLET | Freq: Two times a day (BID) | ORAL | 0 refills | Status: DC

## 2020-02-20 MED ORDER — CETIRIZINE HCL 10 MG PO CAPS: 10.0000 mg | ORAL_CAPSULE | Freq: Every day | ORAL | 0 refills | Status: DC

## 2020-02-20 NOTE — Discharge Instructions (Signed)
Strep test negative, culture pending Begin Augmentin twice daily for the next week Daily cetirizine to help with congestion and drainage May restart Flonase Tylenol and ibuprofen as needed for sore throat Rest and fluids Follow-up if not improving or worsening

## 2020-02-20 NOTE — ED Provider Notes (Signed)
[facility]    CSN: [id number] Arrival date & time: 02/20/20  1008      History   Chief Complaint Chief Complaint  Patient presents with  . Sore Throat    HPI [name] is a 64 y.o. female history of prior tonsillectomy, hypertension, presenting today for evaluation of sore throat.  Patient reports that she has had intermittent sore throat for the past 2 weeks, recently has had increased swelling in her throat.  Reports she has had history of strep since her tonsillectomy and often will have very mild symptoms similar to this.  She denies any fevers.  Denies any associated cough.  Has had some postnasal drainage, but denies significant rhinorrhea.  HPI  Past Medical History:  Diagnosis Date  . Broken ankle 1/16   will have surgery on 01/30/15-right ankle  . Elevated hemoglobin A1c 01/17/15   6.0  . Endometriosis   . Herpes simplex type 1 infection   . Hypertension   . Migraine    h/o migraines, none since hysterectomy  . Varicose vein    surgery on right leg    Patient Active Problem List   Diagnosis Date Noted  . Hyperlipidemia 03/22/2019  . Glucose intolerance 04/11/2018  . GERD (gastroesophageal reflux disease) 04/11/2018  . Allergic rhinitis 12/09/2016  . Ankle instability 03/07/2015  . Vitamin D deficiency 12/04/2013  . Herpes simplex type 1 infection   . Hypertension   . S/P right inguinal hernia repair-Davol 3D max mesh 2003 08/05/2012  . S/P arthroscopy of right shoulder 08/05/2012    Past Surgical History:  Procedure Laterality Date  . ABDOMINAL HYSTERECTOMY  1994   LAVH/RSO  . ANKLE SURGERY Right 01/30/2015  . BREAST BIOPSY Left   . CESAREAN SECTION    . CHOLECYSTECTOMY    . COLON RESECTION  11/06   and lap LSO  . CYSTECTOMY    . HERNIA REPAIR  1982  . HERNIA REPAIR  2003   and bladder repair  . SHOULDER SURGERY Right 04/2017   Tendon repair     OB History    Gravida  2   Para  2   Term      Preterm      AB       Living  2     SAB      TAB      Ectopic      Multiple      Live Births               Home Medications    Prior to Admission medications   Medication Sig Start Date End Date Taking? Authorizing Provider  acyclovir (ZOVIRAX) 400 MG tablet Take 1 tablet (400 mg total) by mouth 3 (three) times daily. 03/21/19   Salley Scarlet, MD  amoxicillin-clavulanate (AUGMENTIN) 875-125 MG tablet Take 1 tablet by mouth every 12 (twelve) hours. 02/20/20   Akiva Josey C, PA-C  aspirin EC 325 MG tablet Take 1 tablet (325 mg total) by mouth daily. 10/19/18   Salley Scarlet, MD  benazepril (LOTENSIN) 20 MG tablet TAKE 1 TABLET(20 MG) BY MOUTH DAILY 07/26/19   Salley Scarlet, MD  CALCIUM PO Take by mouth daily.    [provider]  Cetirizine HCl 10 MG CAPS Take 1 capsule (10 mg total) by mouth daily for 10 days. 02/20/20 03/01/20  Sondos Wolfman C, PA-C  Cholecalciferol (VITAMIN D3) 125 MCG (5000 UT) CAPS 1 capsule daily 03/21/19  [place], Velna Hatchet, MD  ciprofloxacin (CIPRO) 500 MG tablet Take 1 tablet (500 mg total) by mouth 2 (two) times daily. 05/19/19   Salley Scarlet, MD  esomeprazole (NEXIUM) 40 MG capsule Take 1 capsule (40 mg total) by mouth daily. 04/11/18   Salley Scarlet, MD  estradiol (ESTRACE) 0.5 MG tablet Take 1 tablet (0.5 mg total) by mouth daily. 04/03/19   Jerene Bears, MD  fluticasone (FLONASE) 50 MCG/ACT nasal spray Place 2 sprays into both nostrils daily. 04/24/19   [place], Velna Hatchet, MD  HYDROcodone-acetaminophen (NORCO) 5-325 MG tablet Take 1 tablet by mouth every 6 (six) hours as needed for moderate pain. 05/19/19   Salley Scarlet, MD  KRILL OIL PO Take by mouth. Omega red krill oil    [provider]  medroxyPROGESTERone (PROVERA) 2.5 MG tablet Take 1 tablet (2.5 mg total) by mouth daily. 04/03/19   Jerene Bears, MD  Red Yeast Rice Extract (RED YEAST RICE PO) Take by mouth daily.    [provider]  vitamin C (ASCORBIC ACID) 500  MG tablet Take 1,000 mg by mouth 3 (three) times daily.    [provider]    Family History Family History  Problem Relation Age of Onset  . Skin cancer Other   . Lung cancer Father   . Spina bifida Daughter        and hydrocephalia  . Diabetes Mother   . Hypertension Mother   . Thyroid nodules Mother        being watched  . Diabetes Sister        x 3  . Hypertension Sister   . Thyroid nodules Sister     Social History Social History   Tobacco Use  . Smoking status: Never Smoker  . Smokeless tobacco: Never Used  Vaping Use  . Vaping Use: Never used  Substance Use Topics  . Alcohol use: Yes    Alcohol/week: 2.0 - 3.0 standard drinks    Types: 2 - 3 Glasses of wine per week  . Drug use: No     Allergies   Betadine [povidone iodine], Codeine, and Other   Review of Systems Review of Systems  Constitutional: Negative for activity change, appetite change, chills, fatigue and fever.  HENT: Positive for congestion and sore throat. Negative for ear pain, rhinorrhea, sinus pressure and trouble swallowing.   Eyes: Negative for discharge and redness.  Respiratory: Negative for cough, chest tightness and shortness of breath.   Cardiovascular: Negative for chest pain.  Gastrointestinal: Negative for abdominal pain, diarrhea, nausea and vomiting.  Musculoskeletal: Negative for myalgias.  Skin: Negative for rash.  Neurological: Negative for dizziness, light-headedness and headaches.     Physical Exam Triage Vital Signs ED Triage Vitals [02/20/20 1040]  Enc Vitals Group     BP (!) 158/89     Pulse Rate 86     Resp 17     Temp 98.6 F (37 C)     Temp Source Oral     SpO2 97 %     Weight 154 lb (69.9 kg)     Height 5\' 5"  (1.651 m)     Head Circumference      Peak Flow      Pain Score 7     Pain Loc      Pain Edu?      Excl. in GC?    No data found.  Updated Vital Signs BP (!) 158/89 (BP Location: Right Arm)  Pulse 86   Temp 98.6 F (37 C) (Oral)    Resp 17   Ht 5\' 5"  (1.651 m)   Wt 154 lb (69.9 kg)   LMP 04/27/1992   SpO2 97%   BMI 25.63 kg/m   Visual Acuity Right Eye Distance:   Left Eye Distance:   Bilateral Distance:    Right Eye Near:   Left Eye Near:    Bilateral Near:     Physical Exam Vitals and nursing note reviewed.  Constitutional:      Appearance: She is well-developed.     Comments: No acute distress  HENT:     Head: Normocephalic and atraumatic.     Ears:     Comments: Bilateral ears without tenderness to palpation of external auricle, tragus and mastoid, EAC's without erythema or swelling, TM's with good bony landmarks and cone of light. Non erythematous.     Nose: Nose normal.     Mouth/Throat:     Comments: Oral mucosa pink and moist, no tonsillar enlargement or exudate. Posterior pharynx patent and erythematous, no uvula deviation or swelling. Normal phonation. Eyes:     Conjunctiva/sclera: Conjunctivae normal.  Neck:     Comments: Bilateral tonsillar lymphadenopathy, no overlying swelling redness, full active range of motion Cardiovascular:     Rate and Rhythm: Normal rate.  Pulmonary:     Effort: Pulmonary effort is normal. No respiratory distress.     Comments: Breathing comfortably at rest, CTABL, no wheezing, rales or other adventitious sounds auscultated Abdominal:     General: There is no distension.  Musculoskeletal:        General: Normal range of motion.     Cervical back: Neck supple.  Skin:    General: Skin is warm and dry.  Neurological:     Mental Status: She is alert and oriented to person, place, and time.      UC Treatments / Results  Labs (all labs ordered are listed, but only abnormal results are displayed) Labs Reviewed  CULTURE, GROUP A STREP [facility])  POCT RAPID STREP A (OFFICE)    EKG   Radiology No results found.  Procedures Procedures (including critical care time)  Medications Ordered in UC Medications - No data to display  Initial Impression /  Assessment and Plan / UC Course  I have reviewed the triage vital signs and the nursing notes.  Pertinent labs & imaging results that were available during my care of the patient were reviewed by me and considered in my medical decision making (see chart for details).     Strep test negative, given length of symptoms with recent worsening opted to go ahead and cover for pharyngitis and placing on Augmentin, continue symptomatic and supportive care, daily antihistamine to help with his nasal drainage/allergies contributing to throat irritation.  Tylenol and ibuprofen as needed.  Strep culture pending.  Discussed strict return precautions. Patient verbalized understanding and is agreeable with plan.  Final Clinical Impressions(s) / UC Diagnoses   Final diagnoses:  Acute pharyngitis, unspecified etiology     Discharge Instructions     Strep test negative, culture pending Begin Augmentin twice daily for the next week Daily cetirizine to help with congestion and drainage May restart Flonase Tylenol and ibuprofen as needed for sore throat Rest and fluids Follow-up if not improving or worsening   ED Prescriptions    Medication Sig Dispense Auth. Provider   amoxicillin-clavulanate (AUGMENTIN) 875-125 MG tablet Take 1 tablet by mouth every 12 (twelve) hours. 14  tablet Ayyan Sites C, PA-C   Cetirizine HCl 10 MG CAPS Take 1 capsule (10 mg total) by mouth daily for 10 days. 10 capsule Angello Chien, [place] C, PA-C     PDMP not reviewed this encounter.   Lew Dawes, PA-C 02/20/20 1134

## 2020-02-20 NOTE — ED Triage Notes (Signed)
Sore throat x 2 weeks, now her throat feels swollen.  States she has hx of strep

## 2020-02-23 ENCOUNTER — Other Ambulatory Visit: Payer: Self-pay

## 2020-02-23 ENCOUNTER — Encounter: Payer: Self-pay | Admitting: Family Medicine

## 2020-02-23 ENCOUNTER — Ambulatory Visit: Payer: 59 | Admitting: Family Medicine

## 2020-02-23 VITALS — BP 142/86 | HR 82 | Temp 98.1°F | Ht 65.0 in | Wt 159.0 lb

## 2020-02-23 DIAGNOSIS — E559 Vitamin D deficiency, unspecified: Secondary | ICD-10-CM

## 2020-02-23 DIAGNOSIS — E7439 Other disorders of intestinal carbohydrate absorption: Secondary | ICD-10-CM | POA: Diagnosis not present

## 2020-02-23 DIAGNOSIS — E785 Hyperlipidemia, unspecified: Secondary | ICD-10-CM

## 2020-02-23 DIAGNOSIS — F4323 Adjustment disorder with mixed anxiety and depressed mood: Secondary | ICD-10-CM

## 2020-02-23 DIAGNOSIS — I10 Essential (primary) hypertension: Secondary | ICD-10-CM

## 2020-02-23 LAB — CULTURE, GROUP A STREP (THRC)

## 2020-02-23 MED ORDER — ALPRAZOLAM 0.5 MG PO TABS: 0.5000 mg | ORAL_TABLET | Freq: Two times a day (BID) | ORAL | 0 refills | Status: DC | PRN

## 2020-02-23 MED ORDER — BENAZEPRIL HCL 40 MG PO TABS
ORAL_TABLET | ORAL | 1 refills | Status: DC
Start: 2020-02-23 — End: 2020-06-03

## 2020-02-23 NOTE — Patient Instructions (Addendum)
F/U 3 months  Xanax as needed for sleep and stress  Increase benazepril to 40mg 

## 2020-02-23 NOTE — Assessment & Plan Note (Addendum)
Increase benezapril to 40mg  once a day Will not start HCTZ as she is traveling a lot and therefore has to  urinate more She will check BP at home  Goal 120-130/ 70-80

## 2020-02-23 NOTE — Assessment & Plan Note (Signed)
Recheck level Continue placement

## 2020-02-23 NOTE — Assessment & Plan Note (Signed)
Recheck at next visit,fasting

## 2020-02-23 NOTE — Progress Notes (Signed)
Subjective:    Patient ID: [name], female    DOB: [birth year], 64 y.o.   MRN: [id number]  Patient presents for Follow-up Patient here to follow-up chronic medical problems. Medications reviewed.  HTn- she is taking lotensin 20mg  once a day At home 130-150/80-90  Shortness of breath with increasing blood pressure but admits that she has been under a lot of stress  She is helping to care for her mother who lives an hour away along with her sisters.  Things been very stressful since her diet passed away a year ago.  She also cares for her family and the daughter who has chronic health problems.  She denies any exercise she has not been eating much easily.  She is not sleeping well.  She feels very overwhelmed with everything.  She often does not have the energy mentally to get up to go help with her mother  Currently on augmentin from UC due to pharynitis   Hyperperlipidemia- taking red yeast Rice, is not fasting today     Review Of Systems:  GEN- denies fatigue, fever, weight loss,weakness, recent illness HEENT- denies eye drainage, change in vision, nasal discharge, CVS- denies chest pain, palpitations RESP- denies SOB, cough, wheeze ABD- denies N/V, change in stools, abd pain GU- denies dysuria, hematuria, dribbling, incontinence MSK- denies joint pain, muscle aches, injury Neuro- denies headache, dizziness, syncope, seizure activity       Objective:    BP (!) 142/86 (BP Location: Right Arm, Patient Position: Sitting, Cuff Size: Normal)   Pulse 82   Temp 98.1 F (36.7 C) (Oral)   Ht 5\' 5"  (1.651 m)   Wt 159 lb (72.1 kg)   LMP 04/27/1992   SpO2 97%   BMI 26.46 kg/m  GEN- NAD, alert and oriented x3 HEENT- PERRL, EOMI, non injected sclera, pink conjunctiva, MMM, oropharynx clear Neck- Supple, no thyromegaly CVS- RRR, no murmur RESP-CTAB ABD-NABS,soft,NT,ND PSYCH Tearful depressed affect not overly anxious no suicidal ideations well-groomed good eye  contact EXT- No edema Pulses- Radial, DP- 2+   PH9 score 9 with nurse      Assessment & Plan:      Problem List Items Addressed This Visit      Unprioritized   Glucose intolerance   Relevant Orders   Hemoglobin A1c   Hyperlipidemia    Recheck at next visit,fasting       Relevant Medications   benazepril (LOTENSIN) 40 MG tablet   Hypertension - Primary    Increase benezapril to 40mg  once a day Will not start HCTZ as she is traveling a lot and therefore has to  urinate more She will check BP at home  Goal 120-130/ 70-80      Relevant Medications   benazepril (LOTENSIN) 40 MG tablet   Other Relevant Orders   Comprehensive metabolic panel   CBC with Differential/Platelet   Vitamin D deficiency    Recheck level Continue placement       Relevant Orders   Vitamin D, 25-hydroxy    Other Visit Diagnoses    Situational mixed anxiety and depressive disorder       improving sleep will help with fatigue, very difficult situation but she is not resting, given xanax prn for short period, discused this is a habit forming med    Relevant Medications   ALPRAZolam (XANAX) 0.5 MG tablet      Note: This dictation was prepared with Dragon dictation along with smaller phrase technology. Any transcriptional errors that result  from this process are unintentional.

## 2020-02-24 LAB — CBC WITH DIFFERENTIAL/PLATELET
Absolute Monocytes: 437 cells/uL (ref 200–950)
Basophils Absolute: 50 cells/uL (ref 0–200)
Basophils Relative: 1.2 %
Eosinophils Absolute: 71 cells/uL (ref 15–500)
Eosinophils Relative: 1.7 %
HCT: 43.8 % (ref 35.0–45.0)
Hemoglobin: 13.8 g/dL (ref 11.7–15.5)
Lymphs Abs: 1289 cells/uL (ref 850–3900)
MCH: 27.1 pg (ref 27.0–33.0)
MCHC: 31.5 g/dL — ABNORMAL LOW (ref 32.0–36.0)
MCV: 85.9 fL (ref 80.0–100.0)
MPV: 11.9 fL (ref 7.5–12.5)
Monocytes Relative: 10.4 %
Neutro Abs: 2352 cells/uL (ref 1500–7800)
Neutrophils Relative %: 56 %
Platelets: 228 10*3/uL (ref 140–400)
RBC: 5.1 10*6/uL (ref 3.80–5.10)
RDW: 13.2 % (ref 11.0–15.0)
Total Lymphocyte: 30.7 %
WBC: 4.2 10*3/uL (ref 3.8–10.8)

## 2020-02-24 LAB — COMPREHENSIVE METABOLIC PANEL
AG Ratio: 2 (calc) (ref 1.0–2.5)
ALT: 9 U/L (ref 6–29)
AST: 13 U/L (ref 10–35)
Albumin: 4.3 g/dL (ref 3.6–5.1)
Alkaline phosphatase (APISO): 51 U/L (ref 37–153)
BUN: 18 mg/dL (ref 7–25)
CO2: 26 mmol/L (ref 20–32)
Calcium: 9.3 mg/dL (ref 8.6–10.4)
Chloride: 104 mmol/L (ref 98–110)
Creat: 0.66 mg/dL (ref 0.50–0.99)
Globulin: 2.2 g/dL (calc) (ref 1.9–3.7)
Glucose, Bld: 93 mg/dL (ref 65–99)
Potassium: 4.3 mmol/L (ref 3.5–5.3)
Sodium: 139 mmol/L (ref 135–146)
Total Bilirubin: 0.5 mg/dL (ref 0.2–1.2)
Total Protein: 6.5 g/dL (ref 6.1–8.1)

## 2020-02-24 LAB — HEMOGLOBIN A1C
Hgb A1c MFr Bld: 5.7 % of total Hgb — ABNORMAL HIGH (ref <5.7)
Mean Plasma Glucose: 117 (calc)
eAG (mmol/L): 6.5 (calc)

## 2020-02-24 LAB — VITAMIN D 25 HYDROXY (VIT D DEFICIENCY, FRACTURES): Vit D, 25-Hydroxy: 26 ng/mL — ABNORMAL LOW (ref 30–100)

## 2020-03-13 ENCOUNTER — Encounter: Payer: Self-pay | Admitting: Family Medicine

## 2020-03-13 ENCOUNTER — Ambulatory Visit: Payer: 59 | Admitting: Family Medicine

## 2020-03-13 ENCOUNTER — Other Ambulatory Visit: Payer: Self-pay

## 2020-03-13 VITALS — BP 102/66 | HR 76 | Temp 97.8°F | Resp 14 | Ht 65.0 in | Wt 159.0 lb

## 2020-03-13 DIAGNOSIS — J0111 Acute recurrent frontal sinusitis: Secondary | ICD-10-CM

## 2020-03-13 MED ORDER — FLUCONAZOLE 150 MG PO TABS: 150.0000 mg | ORAL_TABLET | Freq: Once | ORAL | 0 refills | Status: AC

## 2020-03-13 MED ORDER — PREDNISONE 20 MG PO TABS: 20.0000 mg | ORAL_TABLET | Freq: Every day | ORAL | 0 refills | Status: DC

## 2020-03-13 MED ORDER — LEVOFLOXACIN 500 MG PO TABS: 500.0000 mg | ORAL_TABLET | Freq: Every day | ORAL | 0 refills | Status: DC

## 2020-03-13 NOTE — Progress Notes (Signed)
   Subjective:    Patient ID: [name], female    DOB: [birth year], 64 y.o.   MRN: [id number]  Patient presents for Inness (x2 weeks - was seen at [facility] and given ABTx for sinus congestion, bt did not resolve sx- continnues to have cough, mucus buildup joint pain and fever)  Patient here with recurrent sore throat sinus pressure and drainage.  She has pressure on her ears as well.  Occasional cough but mostly because of the phlegm in her throat.  Does not feel tight in the chest no shortness of breath.  No change in taste or smell.  She was treated at the urgent care about 2 weeks ago with amoxicillin she had some improvement but her symptoms did not completely resolve.  Note she was seen on the 29th but needs to follow-up for chronic medical problems she was having significant problems with sleep in the.  She was started on low-dose of alprazolam states that she actually slept the whole night through and now has only been taking 0.25 mg at that time and doing well.   Review Of Systems:  GEN- denies fatigue, fever, weight loss,weakness, recent illness HEENT- denies eye drainage, change in vision,+ nasal discharge, CVS- denies chest pain, palpitations RESP- denies SOB, cough, wheeze ABD- denies N/V, change in stools, abd pain GU- denies dysuria, hematuria, dribbling, incontinence MSK- denies joint pain, muscle aches, injury Neuro- denies headache, dizziness, syncope, seizure activity       Objective:    BP 102/66   Pulse 76   Temp 97.8 F (36.6 C) (Temporal)   Resp 14   Ht 5\' 5"  (1.651 m)   Wt 159 lb (72.1 kg)   LMP 04/27/1992   SpO2 98%   BMI 26.46 kg/m  GEN- NAD, alert and oriented x3 HEENT- PERRL, EOMI, non injected sclera, pink conjunctiva, MMM, oropharynx clear , TM clear bilat no effusion,  + maxillary sinus tenderness, inflammed turbinates,  Nasal drainage  Neck- Supple + ant and submandibular  LAD CVS- RRR, no murmur RESP-CTAB EXT- No edema Pulses- Radial  2+         Assessment & Plan:      Problem List Items Addressed This Visit    None    Visit Diagnoses    Acute recurrent frontal sinusitis    -  Primary   Recurrent sinusitis not completely cleared with previous antibiotic.  Will change to Levaquin x7 days add prednisone for inflammation nasal saline, continue oral anti-histamine   Relevant Medications   levofloxacin (LEVAQUIN) 500 MG tablet   fluconazole (DIFLUCAN) 150 MG tablet   predniSONE (DELTASONE) 20 MG tablet      Note: This dictation was prepared with Dragon dictation along with smaller phrase technology. Any transcriptional errors that result from this process are unintentional.

## 2020-03-13 NOTE — Patient Instructions (Addendum)
Use nasal saline Continue zyrtec daily  Take new antibiotic levaquin and steroids ( for 5 days)  F/U as needed

## 2020-04-04 NOTE — Progress Notes (Signed)
64 y.o. G2P2 Married White or Caucasian female here for annual exam.    Continues to take estradiol (oral) and Provera. Pt is unsure why she takes provera. She was recently told she has osteopenia and also wants to continue estrogen for vaginal dryness and skin reasons.  Will discontinue Provera for now and discuss patch next year.  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   Pt has been able to keep her weight off and exercises regularly.  Patient's last menstrual period was 04/27/1992.          Sexually active: Yes.    The current method of family planning is status post hysterectomy.    Exercising: Yes.    Kickboxing, weights Smoker:  no  Health Maintenance: Pap:  10-22-10 normal History of abnormal Pap:  no MMG:  10-23-2019 category c density birads 1:neg Colonoscopy:  09-28-12 f/u 24yrs BMD:   10-23-2019 TDaP:  2019 Gardasil:   n/a Covid-19: pfizer Pneumonia vaccine(s):  Not done Shingrix:   Had both per patient Hep C testing: neg 2017 Screening Labs:  With PCP  reports that she has never smoked. She has never used smokeless tobacco. She reports current alcohol use of about 2.0 - 3.0 standard drinks of alcohol per week. She reports that she does not use drugs.  Past Medical History:  Diagnosis Date   Broken ankle 1/16   will have surgery on 01/30/15-right ankle   Elevated hemoglobin A1c 01/17/15   6.0   Endometriosis    Herpes simplex type 1 infection    Hypertension    Migraine    h/o migraines, none since hysterectomy   Situational depression    Varicose vein    surgery on right leg    Past Surgical History:  Procedure Laterality Date   ABDOMINAL HYSTERECTOMY  1994   LAVH/RSO   ANKLE SURGERY Right 01/30/2015   BREAST BIOPSY Left    CESAREAN SECTION     CHOLECYSTECTOMY     COLON RESECTION  11/06   and lap LSO   CYSTECTOMY      HERNIA REPAIR  1982   HERNIA REPAIR  2003   and bladder repair   SHOULDER SURGERY Right 04/2017   Tendon repair     Current Outpatient Medications  Medication Sig Dispense Refill   acyclovir (ZOVIRAX) 400 MG tablet Take 1 tablet (400 mg total) by mouth 3 (three) times daily. (Patient taking differently: Take 400 mg by mouth as needed.) 30 tablet 1   aspirin EC 325 MG tablet Take 1 tablet (325 mg total) by mouth daily. 30 tablet 0   benazepril (LOTENSIN) 40 MG tablet TAKE 1 TABLET  BY MOUTH DAILY 90 tablet 1   CALCIUM PO Take by mouth daily.     Cetirizine HCl 10 MG CAPS Take 1 capsule (10 mg total) by mouth daily for 10 days. 10 capsule 0   Cholecalciferol (VITAMIN D3) 125 MCG (5000 UT) CAPS 1 capsule daily 30 capsule 0   Coenzyme Q10 (COQ-10 PO) Take by mouth.     esomeprazole (NEXIUM) 40 MG capsule Take 1 capsule (40 mg total) by mouth daily. 30 capsule 0   estradiol (ESTRACE) 0.5 MG tablet Take 1 tablet (0.5 mg total) by mouth daily. 90 tablet 4   KRILL OIL PO Take by mouth. Omega red krill oil  medroxyPROGESTERone (PROVERA) 2.5 MG tablet Take 1 tablet (2.5 mg total) by mouth daily. 90 tablet 4   Red Yeast Rice Extract (RED YEAST RICE PO) Take by mouth daily.     vitamin C (ASCORBIC ACID) 500 MG tablet Take 1,000 mg by mouth 3 (three) times daily.     No current facility-administered medications for this visit.    Family History  Problem Relation Age of Onset   Skin cancer Other    Lung cancer Father    Spina bifida Daughter        and hydrocephalia   Diabetes Mother    Hypertension Mother    Thyroid nodules Mother        being watched   Diabetes Sister        x 3   Hypertension Sister    Thyroid nodules Sister     Review of Systems  Constitutional: Negative.   HENT: Negative.   Eyes: Negative.   Respiratory: Negative.   Cardiovascular: Negative.   Gastrointestinal: Negative.   Endocrine: Negative.   Genitourinary: Negative.    Musculoskeletal: Negative.   Skin: Negative.   Allergic/Immunologic: Negative.   Neurological: Negative.   Hematological: Negative.   Psychiatric/Behavioral: Negative.     Exam:   BP 118/76    Pulse 70    Resp 16    Ht 5' 4.25" (1.632 m)    Wt 156 lb (70.8 kg)    LMP 04/27/1992    BMI 26.57 kg/m   Height: 5' 4.25" (163.2 cm)  General appearance: alert, cooperative and appears stated age Head: Normocephalic, without obvious abnormality, atraumatic Neck: no adenopathy, supple, symmetrical, trachea midline and thyroid normal to inspection and palpation Lungs: clear to auscultation bilaterally Breasts: normal appearance, no masses or tenderness Heart: regular rate and rhythm Abdomen: soft, non-tender; bowel sounds normal; no masses,  no organomegaly Extremities: extremities normal, atraumatic, no cyanosis or edema Skin: Skin color, texture, turgor normal. No rashes or lesions Lymph nodes: Cervical, supraclavicular, and axillary nodes normal. No abnormal inguinal nodes palpated Neurologic: Grossly normal   Pelvic: External genitalia:  no lesions              Urethra:  normal appearing urethra with no masses, tenderness or lesions              Bartholins and Skenes: normal                 Vagina: normal appearing vagina with normal color and discharge, no lesions              Cervix: absent              Pap taken: No. Bimanual Exam:  Uterus:  uterus absent              Adnexa: absent               Rectovaginal: Confirms               Anus:  normal sphincter tone, no visible engorged hemorroids  Taylor, CMA Chaperone was present for exam.  A:  Well Woman with normal exam  Menopause on HRT  Concerned about hemorrhoids, not evident today.   P:   Mammogram due 09/2020  pap smear may discontinue  Estradiol 0.05 mg po sent to pharmacy, d/c Provera 2.5 mg  (HYST in 1994)  Asking about hemorrhoid surgery, advised not indicated at this time. Dr. Hyacinth Meeker suggested if she wants  evaluation, go to colorectal surgeon.  At this time I do not recommend, but call if having a problem in future.  return annually or prn

## 2020-04-08 ENCOUNTER — Other Ambulatory Visit: Payer: Self-pay

## 2020-04-08 ENCOUNTER — Ambulatory Visit: Payer: 59 | Admitting: Nurse Practitioner

## 2020-04-08 ENCOUNTER — Encounter: Payer: Self-pay | Admitting: Nurse Practitioner

## 2020-04-08 ENCOUNTER — Ambulatory Visit: Payer: 59 | Admitting: Obstetrics & Gynecology

## 2020-04-08 VITALS — BP 118/76 | HR 70 | Resp 16 | Ht 64.25 in | Wt 156.0 lb

## 2020-04-08 DIAGNOSIS — Z79890 Hormone replacement therapy (postmenopausal): Secondary | ICD-10-CM

## 2020-04-08 DIAGNOSIS — Z01419 Encounter for gynecological examination (general) (routine) without abnormal findings: Secondary | ICD-10-CM | POA: Diagnosis not present

## 2020-04-08 DIAGNOSIS — N951 Menopausal and female climacteric states: Secondary | ICD-10-CM

## 2020-04-08 MED ORDER — ESTRADIOL 0.5 MG PO TABS: 0.5000 mg | ORAL_TABLET | Freq: Every day | ORAL | 4 refills | Status: DC

## 2020-04-08 NOTE — Patient Instructions (Signed)
Nice to meet you! Congratulations keeping your weight off. Your next mammogram is due 09/2020 Your estradiol will be refilled today.  Health Maintenance for Postmenopausal Women Menopause is a normal process in which your ability to get pregnant comes to an end. This process happens slowly over many months or years, usually between the ages of 75 and 50. Menopause is complete when you have missed your menstrual periods for 12 months. It is important to talk with your health care provider about some of the most common conditions that affect women after menopause (postmenopausal women). These include heart disease, cancer, and bone loss (osteoporosis). Adopting a healthy lifestyle and getting preventive care can help to promote your health and wellness. The actions you take can also lower your chances of developing some of these common conditions. What should I know about menopause? During menopause, you may get a number of symptoms, such as:  Hot flashes. These can be moderate or severe.  Night sweats.  Decrease in sex drive.  Mood swings.  Headaches.  Tiredness.  Irritability.  Memory problems.  Insomnia. Choosing to treat or not to treat these symptoms is a decision that you make with your health care provider. Do I need hormone replacement therapy?  Hormone replacement therapy is effective in treating symptoms that are caused by menopause, such as hot flashes and night sweats.  Hormone replacement carries certain risks, especially as you become older. If you are thinking about using estrogen or estrogen with progestin, discuss the benefits and risks with your health care provider. What is my risk for heart disease and stroke? The risk of heart disease, heart attack, and stroke increases as you age. One of the causes may be a change in the body's hormones during menopause. This can affect how your body uses dietary fats, triglycerides, and cholesterol. Heart attack and stroke are  medical emergencies. There are many things that you can do to help prevent heart disease and stroke. Watch your blood pressure  High blood pressure causes heart disease and increases the risk of stroke. This is more likely to develop in people who have high blood pressure readings, are of African descent, or are overweight.  Have your blood pressure checked: ? Every 3-5 years if you are 55-47 years of age. ? Every year if you are 38 years old or older. Eat a healthy diet   Eat a diet that includes plenty of vegetables, fruits, low-fat dairy products, and lean protein.  Do not eat a lot of foods that are high in solid fats, added sugars, or sodium. Get regular exercise Get regular exercise. This is one of the most important things you can do for your health. Most adults should:  Try to exercise for at least 150 minutes each week. The exercise should increase your heart rate and make you sweat (moderate-intensity exercise).  Try to do strengthening exercises at least twice each week. Do these in addition to the moderate-intensity exercise.  Spend less time sitting. Even light physical activity can be beneficial. Other tips  Work with your health care provider to achieve or maintain a healthy weight.  Do not use any products that contain nicotine or tobacco, such as cigarettes, e-cigarettes, and chewing tobacco. If you need help quitting, ask your health care provider.  Know your numbers. Ask your health care provider to check your cholesterol and your blood sugar (glucose). Continue to have your blood tested as directed by your health care provider. Do I need screening for cancer?  Depending on your health history and family history, you may need to have cancer screening at different stages of your life. This may include screening for:  Breast cancer.  Cervical cancer.  Lung cancer.  Colorectal cancer. What is my risk for osteoporosis? After menopause, you may be at increased  risk for osteoporosis. Osteoporosis is a condition in which bone destruction happens more quickly than new bone creation. To help prevent osteoporosis or the bone fractures that can happen because of osteoporosis, you may take the following actions:  If you are 70-2 years old, get at least 1,000 mg of calcium and at least 600 mg of vitamin D per day.  If you are older than age 93 but younger than age 70, get at least 1,200 mg of calcium and at least 600 mg of vitamin D per day.  If you are older than age 108, get at least 1,200 mg of calcium and at least 800 mg of vitamin D per day. Smoking and drinking excessive alcohol increase the risk of osteoporosis. Eat foods that are rich in calcium and vitamin D, and do weight-bearing exercises several times each week as directed by your health care provider. How does menopause affect my mental health? Depression may occur at any age, but it is more common as you become older. Common symptoms of depression include:  Low or sad mood.  Changes in sleep patterns.  Changes in appetite or eating patterns.  Feeling an overall lack of motivation or enjoyment of activities that you previously enjoyed.  Frequent crying spells. Talk with your health care provider if you think that you are experiencing depression. General instructions See your health care provider for regular wellness exams and vaccines. This may include:  Scheduling regular health, dental, and eye exams.  Getting and maintaining your vaccines. These include: ? Influenza vaccine. Get this vaccine each year before the flu season begins. ? Pneumonia vaccine. ? Shingles vaccine. ? Tetanus, diphtheria, and pertussis (Tdap) booster vaccine. Your health care provider may also recommend other immunizations. Tell your health care provider if you have ever been abused or do not feel safe at home. Summary  Menopause is a normal process in which your ability to get pregnant comes to an  end.  This condition causes hot flashes, night sweats, decreased interest in sex, mood swings, headaches, or lack of sleep.  Treatment for this condition may include hormone replacement therapy.  Take actions to keep yourself healthy, including exercising regularly, eating a healthy diet, watching your weight, and checking your blood pressure and blood sugar levels.  Get screened for cancer and depression. Make sure that you are up to date with all your vaccines. This information is not intended to replace advice given to you by your health care provider. Make sure you discuss any questions you have with your health care provider. Document Revised: 04/06/2018 Document Reviewed: 04/06/2018 Elsevier Patient Education  2020 [employer].

## 2020-04-19 ENCOUNTER — Other Ambulatory Visit: Payer: Self-pay | Admitting: Obstetrics & Gynecology

## 2020-04-19 DIAGNOSIS — N951 Menopausal and female climacteric states: Secondary | ICD-10-CM

## 2020-05-20 ENCOUNTER — Ambulatory Visit: Payer: 59 | Admitting: Family Medicine

## 2020-05-23 ENCOUNTER — Other Ambulatory Visit: Payer: 59

## 2020-05-23 ENCOUNTER — Other Ambulatory Visit: Payer: Self-pay

## 2020-05-23 DIAGNOSIS — Z20822 Contact with and (suspected) exposure to covid-19: Secondary | ICD-10-CM

## 2020-05-24 LAB — NOVEL CORONAVIRUS, NAA: SARS-CoV-2, NAA: NOT DETECTED

## 2020-05-24 LAB — SARS-COV-2, NAA 2 DAY TAT

## 2020-06-03 ENCOUNTER — Encounter: Payer: Self-pay | Admitting: Family Medicine

## 2020-06-03 ENCOUNTER — Ambulatory Visit: Payer: 59 | Admitting: Family Medicine

## 2020-06-03 ENCOUNTER — Other Ambulatory Visit: Payer: Self-pay

## 2020-06-03 VITALS — BP 142/80 | HR 82 | Temp 97.9°F | Resp 14 | Ht 64.25 in | Wt 162.0 lb

## 2020-06-03 DIAGNOSIS — G4700 Insomnia, unspecified: Secondary | ICD-10-CM | POA: Insufficient documentation

## 2020-06-03 DIAGNOSIS — E785 Hyperlipidemia, unspecified: Secondary | ICD-10-CM

## 2020-06-03 DIAGNOSIS — E559 Vitamin D deficiency, unspecified: Secondary | ICD-10-CM | POA: Diagnosis not present

## 2020-06-03 DIAGNOSIS — E7439 Other disorders of intestinal carbohydrate absorption: Secondary | ICD-10-CM

## 2020-06-03 DIAGNOSIS — F5102 Adjustment insomnia: Secondary | ICD-10-CM

## 2020-06-03 DIAGNOSIS — I10 Essential (primary) hypertension: Secondary | ICD-10-CM | POA: Diagnosis not present

## 2020-06-03 DIAGNOSIS — K219 Gastro-esophageal reflux disease without esophagitis: Secondary | ICD-10-CM | POA: Diagnosis not present

## 2020-06-03 MED ORDER — ACYCLOVIR 400 MG PO TABS
400.0000 mg | ORAL_TABLET | Freq: Three times a day (TID) | ORAL | 1 refills | Status: DC
Start: 2020-06-03 — End: 2021-12-31

## 2020-06-03 MED ORDER — BENAZEPRIL HCL 40 MG PO TABS
ORAL_TABLET | ORAL | 1 refills | Status: DC
Start: 2020-06-03 — End: 2020-12-04

## 2020-06-03 MED ORDER — ALPRAZOLAM 0.5 MG PO TABS: 0.5000 mg | ORAL_TABLET | Freq: Two times a day (BID) | ORAL | 1 refills | Status: DC | PRN

## 2020-06-03 NOTE — Assessment & Plan Note (Signed)
Controlled no changes 

## 2020-06-03 NOTE — Patient Instructions (Addendum)
Return for fasting labs labs

## 2020-06-03 NOTE — Assessment & Plan Note (Signed)
Return for fasting labs tomorrow Continue red yeast rice Weight up a few pounds from baseline, admited to some dietary indiscretions during the holidays Recheck A1C

## 2020-06-03 NOTE — Assessment & Plan Note (Signed)
situational with care of mother and daughter who has physical needs Prn xanax, does not use every night

## 2020-06-03 NOTE — Progress Notes (Signed)
   Subjective:    Patient ID: Marica Otter, female    DOB: 1955/06/07, 65 y.o.   MRN: [id number]  Patient presents for Follow-up (Is not fasting/)  Patient here to follow-up chronic medical problems medications reviewed. Hypertension she is taken benazepril 40 mg once a day blood pressure  Insomnia related to stressors she has been on alprazolam since the fall  Hyperlipidemia she is taking red yeast rice  Follows a GYN she is on estrogen and progesterone replacement.  GERD she takes Nexium 40 mg daily to maintain her symptoms.  Borderline diabetes mellitus for the past few years she has maintained her A1c between 5.7 and 5.9.  Last A1c 5.7 in October she tries to reduce sugars   Vitamin D deficiency currently on 4000 IU daily   Review Of Systems:  GEN- denies fatigue, fever, weight loss,weakness, recent illness HEENT- denies eye drainage, change in vision, nasal discharge, CVS- denies chest pain, palpitations RESP- denies SOB, cough, wheeze ABD- denies N/V, change in stools, abd pain GU- denies dysuria, hematuria, dribbling, incontinence MSK- denies joint pain, muscle aches, injury Neuro- denies headache, dizziness, syncope, seizure activity       Objective:    BP (!) 142/80   Pulse 82   Temp 97.9 F (36.6 C) (Temporal)   Resp 14   Ht 5' 4.25" (1.632 m)   Wt 162 lb (73.5 kg)   LMP 04/27/1992   SpO2 97%   BMI 27.59 kg/m  GEN- NAD, alert and oriented x3 HEENT- PERRL, EOMI, non injected sclera, pink conjunctiva, MMM, oropharynx clear Neck- Supple, no thyromegaly CVS- RRR, no murmur RESP-CTAB ABD-NABS,soft,NT,ND EXT- No edema Pulses- Radial, DP- 2+        Assessment & Plan:      Problem List Items Addressed This Visit      Unprioritized   GERD (gastroesophageal reflux disease)   Glucose intolerance   Relevant Orders   Hemoglobin A1c   Hyperlipidemia    Return for fasting labs tomorrow Continue red yeast rice Weight up a few pounds from baseline,  admited to some dietary indiscretions during the holidays Recheck A1C      Relevant Medications   benazepril (LOTENSIN) 40 MG tablet   Other Relevant Orders   Lipid panel   Hypertension - Primary    Controlled no changes       Relevant Medications   benazepril (LOTENSIN) 40 MG tablet   Other Relevant Orders   CBC with Differential/Platelet   Comprehensive metabolic panel   Insomnia    situational with care of mother and daughter who has physical needs Prn xanax, does not use every night      Vitamin D deficiency   Relevant Orders   Vitamin D, 25-hydroxy      Note: This dictation was prepared with Dragon dictation along with smaller phrase technology. Any transcriptional errors that result from this process are unintentional.

## 2020-06-04 ENCOUNTER — Other Ambulatory Visit: Payer: 59

## 2020-06-04 DIAGNOSIS — E785 Hyperlipidemia, unspecified: Secondary | ICD-10-CM

## 2020-06-04 DIAGNOSIS — E559 Vitamin D deficiency, unspecified: Secondary | ICD-10-CM

## 2020-06-04 DIAGNOSIS — E7439 Other disorders of intestinal carbohydrate absorption: Secondary | ICD-10-CM

## 2020-06-04 DIAGNOSIS — I10 Essential (primary) hypertension: Secondary | ICD-10-CM

## 2020-06-06 LAB — CBC WITH DIFFERENTIAL/PLATELET
Absolute Monocytes: 440 cells/uL (ref 200–950)
Basophils Absolute: 62 cells/uL (ref 0–200)
Basophils Relative: 1.4 %
Eosinophils Absolute: 79 cells/uL (ref 15–500)
Eosinophils Relative: 1.8 %
HCT: 42.8 % (ref 35.0–45.0)
Hemoglobin: 13.7 g/dL (ref 11.7–15.5)
Lymphs Abs: 1426 cells/uL (ref 850–3900)
MCH: 27.2 pg (ref 27.0–33.0)
MCHC: 32 g/dL (ref 32.0–36.0)
MCV: 84.9 fL (ref 80.0–100.0)
MPV: 11.7 fL (ref 7.5–12.5)
Monocytes Relative: 10 %
Neutro Abs: 2394 cells/uL (ref 1500–7800)
Neutrophils Relative %: 54.4 %
Platelets: 220 10*3/uL (ref 140–400)
RBC: 5.04 10*6/uL (ref 3.80–5.10)
RDW: 13.1 % (ref 11.0–15.0)
Total Lymphocyte: 32.4 %
WBC: 4.4 10*3/uL (ref 3.8–10.8)

## 2020-06-06 LAB — COMPREHENSIVE METABOLIC PANEL
AG Ratio: 2.2 (calc) (ref 1.0–2.5)
ALT: 8 U/L (ref 6–29)
AST: 12 U/L (ref 10–35)
Albumin: 4.3 g/dL (ref 3.6–5.1)
Alkaline phosphatase (APISO): 46 U/L (ref 37–153)
BUN: 15 mg/dL (ref 7–25)
CO2: 28 mmol/L (ref 20–32)
Calcium: 9.3 mg/dL (ref 8.6–10.4)
Chloride: 107 mmol/L (ref 98–110)
Creat: 0.58 mg/dL (ref 0.50–0.99)
Globulin: 2 g/dL (calc) (ref 1.9–3.7)
Glucose, Bld: 101 mg/dL — ABNORMAL HIGH (ref 65–99)
Potassium: 4.8 mmol/L (ref 3.5–5.3)
Sodium: 143 mmol/L (ref 135–146)
Total Bilirubin: 0.4 mg/dL (ref 0.2–1.2)
Total Protein: 6.3 g/dL (ref 6.1–8.1)

## 2020-06-06 LAB — HEMOGLOBIN A1C
Hgb A1c MFr Bld: 5.8 % of total Hgb — ABNORMAL HIGH (ref <5.7)
Mean Plasma Glucose: 120 mg/dL
eAG (mmol/L): 6.6 mmol/L

## 2020-06-06 LAB — VITAMIN D 25 HYDROXY (VIT D DEFICIENCY, FRACTURES): Vit D, 25-Hydroxy: 27 ng/mL — ABNORMAL LOW (ref 30–100)

## 2020-06-06 LAB — LIPID PANEL
Cholesterol: 239 mg/dL — ABNORMAL HIGH (ref <200)
HDL: 78 mg/dL (ref >=50)
LDL Cholesterol (Calc): 147 mg/dL (calc) — ABNORMAL HIGH
Non-HDL Cholesterol (Calc): 161 mg/dL (calc) — ABNORMAL HIGH (ref <130)
Total CHOL/HDL Ratio: 3.1 (calc) (ref <5.0)
Triglycerides: 51 mg/dL (ref <150)

## 2020-06-11 MED ORDER — VITAMIN D (ERGOCALCIFEROL) 1.25 MG (50000 UNIT) PO CAPS: ORAL_CAPSULE | ORAL | 0 refills | Status: DC

## 2020-06-11 MED ORDER — PRAVASTATIN SODIUM 20 MG PO TABS: ORAL_TABLET | ORAL | 0 refills | Status: DC

## 2020-06-14 ENCOUNTER — Ambulatory Visit: Payer: 59 | Admitting: Obstetrics & Gynecology

## 2020-08-17 ENCOUNTER — Ambulatory Visit: Payer: 59 | Admitting: Podiatry

## 2020-08-17 ENCOUNTER — Encounter: Payer: Self-pay | Admitting: Podiatry

## 2020-08-17 ENCOUNTER — Other Ambulatory Visit: Payer: Self-pay

## 2020-08-17 ENCOUNTER — Ambulatory Visit (INDEPENDENT_AMBULATORY_CARE_PROVIDER_SITE_OTHER): Payer: 59

## 2020-08-17 ENCOUNTER — Other Ambulatory Visit: Payer: Self-pay | Admitting: Podiatry

## 2020-08-17 DIAGNOSIS — M7750 Other enthesopathy of unspecified foot: Secondary | ICD-10-CM | POA: Diagnosis not present

## 2020-08-17 DIAGNOSIS — M7752 Other enthesopathy of left foot: Secondary | ICD-10-CM | POA: Diagnosis not present

## 2020-08-17 DIAGNOSIS — M778 Other enthesopathies, not elsewhere classified: Secondary | ICD-10-CM | POA: Diagnosis not present

## 2020-08-17 DIAGNOSIS — M19072 Primary osteoarthritis, left ankle and foot: Secondary | ICD-10-CM | POA: Diagnosis not present

## 2020-08-17 DIAGNOSIS — M722 Plantar fascial fibromatosis: Secondary | ICD-10-CM | POA: Diagnosis not present

## 2020-08-17 MED ORDER — MELOXICAM 15 MG PO TABS: 15.0000 mg | ORAL_TABLET | Freq: Every day | ORAL | 0 refills | Status: DC

## 2020-08-17 NOTE — Progress Notes (Signed)
Subjective: 65 year old female presents the office today for concerns of left foot pain.  She states that she gets pain across the top of her foot from 1 side to the other which is been on the about 2 months.  She denies any recent injury or trauma to her foot.  She does get swelling.  Occasional sharp discomfort.  She states the pain is not bad today.  She does state that certain shoes aggravate her symptoms.  She is on no recent treatment.  She has no new concerns otherwise.  States that her right ankle is been doing great and no concerns.  Denies any systemic complaints such as fevers, chills, nausea, vomiting. No acute changes since last appointment, and no other complaints at this time.   Objective: AAO x3, NAD DP/PT pulses palpable bilaterally, CRT less than 3 seconds On the left foot there is edema present mostly to the dorsal aspect of the midfoot going distal to the ankle.  There is no erythema or warmth associate with swelling.  There is minimal discomfort on the dorsal aspect the midfoot but there is no specific area of pinpoint tenderness.  Today there is no significant discomfort on the course or insertion point of he had left heel pain.  There is no pain of the tibia, fibula.  No pain with ankle joint range of motion.  No pain of the subtalar joint.  MMT 5/5. No pain with calf compression, swelling, warmth, erythema  Assessment: Left foot swelling, arthritis/tendonitis; history of plantar fasciitis  Plan: -All treatment options discussed with the patient including all alternatives, risks, complications.  -X-rays obtained reviewed.  Arthritic changes present the dorsal midfoot.  There is no evidence of acute fracture identified today.  Questionable old injury to the medial malleolus on the AP ankle but does not appear to be acute.  No pain in this area. -Prescribed mobic. Discussed side effects of the medication and directed to stop if any are to occur and call the office.  -Ice to  the area daily -Orthotics have been doing well but they seem not to be contouring her foot.  She was measured for new orthotics today. -Discussed supportive shoes and different shoe gear. -If no improvement MRI -Patient encouraged to call the office with any questions, concerns, change in symptoms.   Vivi Barrack DPM

## 2020-09-06 ENCOUNTER — Telehealth: Payer: Self-pay | Admitting: Podiatry

## 2020-09-06 NOTE — Telephone Encounter (Signed)
Orthotics in.. lvm for pt to call to discuss if ok with picking up since she has had them previously several yrs ago or if she feels she needs an appt.

## 2020-09-11 ENCOUNTER — Ambulatory Visit: Payer: 59 | Admitting: Nurse Practitioner

## 2020-09-11 ENCOUNTER — Encounter: Payer: Self-pay | Admitting: Nurse Practitioner

## 2020-09-11 ENCOUNTER — Other Ambulatory Visit: Payer: Self-pay

## 2020-09-11 VITALS — BP 132/62 | HR 86 | Temp 98.4°F | Resp 16 | Ht 64.25 in | Wt 161.0 lb

## 2020-09-11 DIAGNOSIS — H9202 Otalgia, left ear: Secondary | ICD-10-CM

## 2020-09-11 DIAGNOSIS — J302 Other seasonal allergic rhinitis: Secondary | ICD-10-CM | POA: Diagnosis not present

## 2020-09-11 MED ORDER — LEVOCETIRIZINE DIHYDROCHLORIDE 5 MG PO TABS: 5.0000 mg | ORAL_TABLET | Freq: Every evening | ORAL | 1 refills | Status: DC

## 2020-09-11 MED ORDER — PREDNISONE 10 MG PO TABS: 10.0000 mg | ORAL_TABLET | Freq: Every day | ORAL | 0 refills | Status: DC

## 2020-09-11 NOTE — Progress Notes (Signed)
Subjective:    Patient ID: [name], female    DOB: 1956-01-29, 65 y.o.   MRN: [id number]  HPI: [name] is a 64 y.o. female presenting for ear pain.  Chief Complaint  Patient presents with  . Seasonal allergies    Allergies, sinus pressure, drainage, B ear pressure and pain- states that L ear> R ear, has been gong on in L ear or months, ringing in ears No impaction to ears   EAR PAIN Had fluid in it October; was "treated" and went away. Duration: weeks Involved ear(s): left Severity:  Moderate to severe  Quality:  Throbbing; worse in the evenings Fever: no Otorrhea: no Upper respiratory infection symptoms: no; allergies Pruritus: no Hearing loss: no  Tinnitus: yes Popping helps: yes Water immersion no Using Q-tips: yes; does not "go deep"  Recurrent otitis media: no Status: stable Treatments attempted: Motrin/Tylenol  Allergies - cannot go outside without mask, starts coughing.  Currently taking ceritizine for allergies, using flonase in the mornings.  In the past, used fexofendaine for allergies and this stopped working.   Allergies  Allergen Reactions  . Betadine [Povidone Iodine] Itching  . Codeine   . Other     Band aids-itchy, red rash    Outpatient Encounter Medications as of 09/11/2020  Medication Sig  . aspirin EC 325 MG tablet Take 1 tablet (325 mg total) by mouth daily. (Patient taking differently: Take 325 mg by mouth every other day.)  . benazepril (LOTENSIN) 40 MG tablet TAKE 1 TABLET  BY MOUTH DAILY  . CALCIUM PO Take by mouth daily.  . Coenzyme Q10 (COQ-10 PO) Take by mouth.  . estradiol (ESTRACE) 0.5 MG tablet Take 1 tablet (0.5 mg total) by mouth daily.  Marland Kitchen KRILL OIL PO Take by mouth. Omega red krill oil  . levocetirizine (XYZAL) 5 MG tablet Take 1 tablet (5 mg total) by mouth every evening.  . meloxicam (MOBIC) 15 MG tablet Take 1 tablet (15 mg total) by mouth daily.  . predniSONE (DELTASONE) 10 MG tablet Take 1 tablet (10 mg total)  by mouth daily with breakfast. Take 3 tablets (30 mg) days 1-2; take 2 tablets (20 mg) days 3-4, take 1 tablet (10 mg) days 5-6, then stop.  . Red Yeast Rice Extract (RED YEAST RICE PO) Take by mouth daily.  . vitamin C (ASCORBIC ACID) 500 MG tablet Take 1,000 mg by mouth 3 (three) times daily.  . Vitamin D, Ergocalciferol, (DRISDOL) 1.25 MG (50000 UNIT) CAPS capsule TAKE 1 (ONE) TABLET 50,000U WEEKLY FOR 12 WEEKS.  . [DISCONTINUED] Cetirizine HCl 10 MG CAPS Take 1 capsule (10 mg total) by mouth daily for 10 days.  Marland Kitchen acyclovir (ZOVIRAX) 400 MG tablet Take 1 tablet (400 mg total) by mouth 3 (three) times daily. (Patient not taking: Reported on 09/11/2020)  . ALPRAZolam (XANAX) 0.5 MG tablet Take 1 tablet (0.5 mg total) by mouth 2 (two) times daily as needed for anxiety. (Patient not taking: Reported on 09/11/2020)  . Cholecalciferol (VITAMIN D3) 125 MCG (5000 UT) CAPS 1 capsule daily (Patient not taking: Reported on 09/11/2020)  . [DISCONTINUED] esomeprazole (NEXIUM) 40 MG capsule Take 1 capsule (40 mg total) by mouth daily. (Patient not taking: Reported on 09/11/2020)  . [DISCONTINUED] pravastatin (PRAVACHOL) 20 MG tablet TAKE 1 TABLET AT BEDTIME (Patient not taking: Reported on 09/11/2020)   No facility-administered encounter medications on file as of 09/11/2020.    Patient Active Problem List   Diagnosis Date Noted  .  Insomnia 06/03/2020  . Hyperlipidemia 03/22/2019  . Glucose intolerance 04/11/2018  . GERD (gastroesophageal reflux disease) 04/11/2018  . Allergic rhinitis 12/09/2016  . Ankle instability 03/07/2015  . Vitamin D deficiency 12/04/2013  . Herpes simplex type 1 infection   . Hypertension   . S/P right inguinal hernia repair-Davol 3D max mesh 2003 08/05/2012  . S/P arthroscopy of right shoulder 08/05/2012    Past Medical History:  Diagnosis Date  . Broken ankle 1/16   will have surgery on 01/30/15-right ankle  . Elevated hemoglobin A1c 01/17/15   6.0  . Endometriosis   .  Herpes simplex type 1 infection   . Hypertension   . Migraine    h/o migraines, none since hysterectomy  . Situational depression   . Varicose vein    surgery on right leg    Relevant past medical, surgical, family and social history reviewed and updated as indicated. Interim medical history since our last visit reviewed.  Review of Systems Per HPI unless specifically indicated above     Objective:    BP 132/62   Pulse 86   Temp 98.4 F (36.9 C) (Temporal)   Resp 16   Ht 5' 4.25" (1.632 m)   Wt 161 lb (73 kg)   LMP 04/27/1992   SpO2 98%   BMI 27.42 kg/m   Wt Readings from Last 3 Encounters:  09/11/20 161 lb (73 kg)  06/03/20 162 lb (73.5 kg)  04/08/20 156 lb (70.8 kg)    Physical Exam Vitals and nursing note reviewed.  Constitutional:      General: She is not in acute distress.    Appearance: Normal appearance. She is not toxic-appearing.  HENT:     Head: Normocephalic and atraumatic.     Right Ear: External ear normal. There is impacted cerumen.     Left Ear: Tympanic membrane, ear canal and external ear normal. There is impacted cerumen.     Nose: Nose normal. No congestion.     Mouth/Throat:     Mouth: Mucous membranes are moist.     Pharynx: Oropharynx is clear. No oropharyngeal exudate or posterior oropharyngeal erythema.  Eyes:     General: No scleral icterus.       Right eye: No discharge.        Left eye: No discharge.  Pulmonary:     Effort: Pulmonary effort is normal. No respiratory distress.     Breath sounds: Normal breath sounds. No wheezing, rhonchi or rales.  Musculoskeletal:     Cervical back: Normal range of motion.  Lymphadenopathy:     Cervical: No cervical adenopathy.  Skin:    General: Skin is warm and dry.     Capillary Refill: Capillary refill takes less than 2 seconds.     Coloration: Skin is not jaundiced or pale.     Findings: No erythema.  Neurological:     Mental Status: She is alert.  Psychiatric:        Mood and Affect:  Mood normal.        Behavior: Behavior normal.        Thought Content: Thought content normal.        Judgment: Judgment normal.       Assessment & Plan:  1. Seasonal allergic rhinitis, unspecified trigger Chronic.  Since cetirizine seems to not be offering much benefit, will switch to Xyzal.  Continue flonase.  Follow up if Xyzal does not help.  - levocetirizine (XYZAL) 5 MG tablet; Take 1 tablet (  5 mg total) by mouth every evening.  Dispense: 90 tablet; Refill: 1  2. Ear pain, left Bilateral ears irrigated with solution of warm water and peroxide.  Impacted cerumen removed from left ear; patient reported some improvement in the ringing and ear pain.  Will also give short course of steroids to help with inflammation due to likely eustachian tube dysfunction.  Follow up with no improvement or with worsening symptoms and will consider ENT referral.  - predniSONE (DELTASONE) 10 MG tablet; Take 1 tablet (10 mg total) by mouth daily with breakfast. Take 3 tablets (30 mg) days 1-2; take 2 tablets (20 mg) days 3-4, take 1 tablet (10 mg) days 5-6, then stop.  Dispense: 12 tablet; Refill: 0    Follow up plan: Return for as scheduled.

## 2020-09-24 ENCOUNTER — Other Ambulatory Visit: Payer: Self-pay

## 2020-09-24 ENCOUNTER — Ambulatory Visit (INDEPENDENT_AMBULATORY_CARE_PROVIDER_SITE_OTHER): Payer: 59 | Admitting: Podiatry

## 2020-09-24 DIAGNOSIS — M778 Other enthesopathies, not elsewhere classified: Secondary | ICD-10-CM

## 2020-09-24 DIAGNOSIS — M722 Plantar fascial fibromatosis: Secondary | ICD-10-CM

## 2020-09-24 DIAGNOSIS — M19072 Primary osteoarthritis, left ankle and foot: Secondary | ICD-10-CM

## 2020-09-24 DIAGNOSIS — M7750 Other enthesopathy of unspecified foot: Secondary | ICD-10-CM

## 2020-09-24 NOTE — Progress Notes (Signed)
Patient presents today for orthotic pick up. Patient voices no new complaints.  Orthotics were fitted to patient's feet. No discomfort and no rubbing. Patient satisfied with the orthotics.  Orthotics were dispensed to patient with instructions for break in wear and to call the office with any concerns or questions. 

## 2020-09-24 NOTE — Patient Instructions (Signed)

## 2020-10-30 ENCOUNTER — Other Ambulatory Visit: Payer: Self-pay | Admitting: Obstetrics & Gynecology

## 2020-10-30 DIAGNOSIS — Z1231 Encounter for screening mammogram for malignant neoplasm of breast: Secondary | ICD-10-CM

## 2020-10-31 ENCOUNTER — Ambulatory Visit: Payer: 59

## 2020-11-22 ENCOUNTER — Other Ambulatory Visit: Payer: Self-pay | Admitting: *Deleted

## 2020-11-22 NOTE — Telephone Encounter (Signed)
Received fax requesting refill on Xanax.   Ok to refill??  Last office visit 09/11/2020.  Last refill 06/03/2020, #1 refill.

## 2020-11-25 NOTE — Telephone Encounter (Signed)
Patient has appointment on 12/02/2020.

## 2020-12-02 ENCOUNTER — Other Ambulatory Visit: Payer: Self-pay

## 2020-12-02 ENCOUNTER — Ambulatory Visit: Payer: 59

## 2020-12-04 ENCOUNTER — Ambulatory Visit: Payer: 59 | Admitting: Nurse Practitioner

## 2020-12-04 ENCOUNTER — Other Ambulatory Visit: Payer: Self-pay

## 2020-12-04 VITALS — BP 142/100 | HR 71 | Temp 98.5°F | Ht 64.25 in | Wt 164.2 lb

## 2020-12-04 DIAGNOSIS — F4321 Adjustment disorder with depressed mood: Secondary | ICD-10-CM

## 2020-12-04 DIAGNOSIS — E559 Vitamin D deficiency, unspecified: Secondary | ICD-10-CM

## 2020-12-04 DIAGNOSIS — E785 Hyperlipidemia, unspecified: Secondary | ICD-10-CM

## 2020-12-04 DIAGNOSIS — Z79899 Other long term (current) drug therapy: Secondary | ICD-10-CM

## 2020-12-04 DIAGNOSIS — F5102 Adjustment insomnia: Secondary | ICD-10-CM

## 2020-12-04 DIAGNOSIS — I10 Essential (primary) hypertension: Secondary | ICD-10-CM

## 2020-12-04 DIAGNOSIS — R7303 Prediabetes: Secondary | ICD-10-CM

## 2020-12-04 DIAGNOSIS — K219 Gastro-esophageal reflux disease without esophagitis: Secondary | ICD-10-CM

## 2020-12-04 MED ORDER — ALPRAZOLAM 0.5 MG PO TABS: 0.5000 mg | ORAL_TABLET | Freq: Every evening | ORAL | 0 refills | Status: DC | PRN

## 2020-12-04 MED ORDER — CITALOPRAM HYDROBROMIDE 10 MG PO TABS
10.0000 mg | ORAL_TABLET | Freq: Every day | ORAL | 1 refills | Status: DC
Start: 2020-12-04 — End: 2021-01-17

## 2020-12-04 MED ORDER — BENAZEPRIL HCL 40 MG PO TABS: ORAL_TABLET | ORAL | 1 refills | Status: DC

## 2020-12-04 NOTE — Progress Notes (Addendum)
Subjective:    Patient ID: [name], female    DOB: 05-28-1955, 65 y.o.   MRN: [id number]  HPI: [name] is a 65 y.o. female presenting for anxiety and insomnia.   Chief Complaint  Patient presents with   Medication Refill   family truama    Taking xanax to help sleep. Parents killed by mva/drunk driver and sudden loss of sister   INSOMNIA Lost sister in June unexpectedly.  She is primary caregiver, along with sisters, for mother who is now disabled secondary to MVA.  They are trying to get her in a nursing home for long term care.  She tries to shut her mind off at night, but sometimes her mind will not shut off.  In the past, she took alprazolam 0.5 mg 1/2 tablet at night time for sleep as needed.  She never took this medication daily, usually only for a couple of days per week at the most.  Duration: chronic Satisfied with sleep quality: no Difficulty falling asleep: yes Difficulty staying asleep: yes Waking a few hours after sleep onset: yes Early morning awakenings: no Daytime hypersomnolence: no Wakes feeling refreshed: no Good sleep hygiene: yes Depressed/anxious mood: yes Recent stress: yes  SITUATIONAL ANXIETY/DEPRESSION Duration: acute Anxious mood: yes  Excessive worrying: yes Irritability: yes  Sweating: no Nausea: no Palpitations:yes Hyperventilation: no Panic attacks: no Agoraphobia: no  Obscessions/compulsions: no Depressed mood: yes Depression screen [facility] 2/9 12/04/2020 06/03/2020 02/23/2020 10/19/2018 05/31/2017  Decreased Interest 2 0 0 1 0  Down, Depressed, Hopeless 2 0 0 0 0  PHQ - 2 Score 4 0 0 1 0  Altered sleeping 3 - - - -  Tired, decreased energy 2 - - - -  Change in appetite 2 - - - -  Feeling bad or failure about yourself  1 - - - -  Trouble concentrating 2 - - - -  Moving slowly or fidgety/restless 2 - - - -  Suicidal thoughts 0 - - - -  PHQ-9 Score 16 - - - -  Difficult doing work/chores Somewhat difficult - - - -    GAD 7 :  Generalized Anxiety Score 12/04/2020  Nervous, Anxious, on Edge 2  Control/stop worrying 1  Worry too much - different things 2  Trouble relaxing 2  Restless 2  Easily annoyed or irritable 2  Afraid - awful might happen 1  Total GAD 7 Score 12  Anxiety Difficulty Somewhat difficult  Anhedonia: yes Weight changes: no Insomnia: yes  hard to fall asleep and stay asleep   Hypersomnia: no Fatigue/loss of energy: yes Feelings of worthlessness: yes Feelings of guilt: yes Impaired concentration/indecisiveness: yes Suicidal ideations: no  Crying spells: no Recent Stressors/Life Changes: yes   Relationship problems: no   Family stress: yes     Financial stress: no    Job stress: no    Recent death/loss: yes  HYPERTENSION / HYPERLIPIDEMIA Currently taking benazepril 40 mg daily.  She is trying to work hard on exercise to prevent disease and not have to take medicine. Satisfied with current treatment? yes Duration of hypertension: chronic BP monitoring frequency: monthly BP range: range 120s-150s/70-90s.  Average ~140/80 BEFORE taking medication BP medication side effects: no Duration of hyperlipidemia: chronic Cholesterol medication side effects: not on prescription treatment Cholesterol supplements: red yeast rice, Co q 10 Past cholesterol medications: no Aspirin: yes Recent stressors: yes Recurrent headaches: no Visual changes: no Palpitations: no Dyspnea: no Chest pain: no Lower extremity edema:  no Dizzy/lightheaded: no  The 10-year ASCVD risk score Denman George(Goff DC Jr., et al., 2013) is: 7.7%   Values used to calculate the score:     Age: [age] years     Sex: Female     Is Non-Hispanic African American: No     Diabetic: No     Tobacco smoker: No     Systolic Blood Pressure: 142 mmHg     Is BP treated: Yes     HDL Cholesterol: 78 mg/dL     Total Cholesterol: 239 mg/dL   PRE-DIABETES Hypoglycemic episodes:no Polydipsia/polyuria: no Visual disturbance: no Chest pain:  no Paresthesias: no Glucose Monitoring: no Physical activity: tries to exercise every other day on her elliptical Dietary habits: tries to eat at home  Allergies  Allergen Reactions   Betadine [Povidone Iodine] Itching   Codeine    Other     Band aids-itchy, red rash    Outpatient Encounter Medications as of 12/04/2020  Medication Sig   aspirin EC 325 MG tablet Take 1 tablet (325 mg total) by mouth daily. (Patient taking differently: Take 325 mg by mouth every other day.)   CALCIUM PO Take by mouth daily.   Cholecalciferol (VITAMIN D3) 125 MCG (5000 UT) CAPS 1 capsule daily   citalopram (CELEXA) 10 MG tablet Take 1 tablet (10 mg total) by mouth daily.   Coenzyme Q10 (COQ-10 PO) Take by mouth.   estradiol (ESTRACE) 0.5 MG tablet Take 1 tablet (0.5 mg total) by mouth daily.   KRILL OIL PO Take by mouth. Omega red krill oil   levocetirizine (XYZAL) 5 MG tablet Take 1 tablet (5 mg total) by mouth every evening.   Red Yeast Rice Extract (RED YEAST RICE PO) Take by mouth daily.   vitamin C (ASCORBIC ACID) 500 MG tablet Take 1,000 mg by mouth 3 (three) times daily.   Vitamin D, Ergocalciferol, (DRISDOL) 1.25 MG (50000 UNIT) CAPS capsule TAKE 1 (ONE) TABLET 50,000U WEEKLY FOR 12 WEEKS.   [DISCONTINUED] ALPRAZolam (XANAX) 0.5 MG tablet Take 1 tablet (0.5 mg total) by mouth 2 (two) times daily as needed for anxiety.   [DISCONTINUED] benazepril (LOTENSIN) 40 MG tablet TAKE 1 TABLET  BY MOUTH DAILY   [DISCONTINUED] predniSONE (DELTASONE) 10 MG tablet Take 1 tablet (10 mg total) by mouth daily with breakfast. Take 3 tablets (30 mg) days 1-2; take 2 tablets (20 mg) days 3-4, take 1 tablet (10 mg) days 5-6, then stop.   acyclovir (ZOVIRAX) 400 MG tablet Take 1 tablet (400 mg total) by mouth 3 (three) times daily. (Patient not taking: Reported on 12/04/2020)   ALPRAZolam (XANAX) 0.5 MG tablet Take 1 tablet (0.5 mg total) by mouth at bedtime as needed for sleep.   benazepril (LOTENSIN) 40 MG tablet TAKE  1 TABLET  BY MOUTH DAILY   meloxicam (MOBIC) 15 MG tablet Take 1 tablet (15 mg total) by mouth daily. (Patient not taking: Reported on 12/04/2020)   No facility-administered encounter medications on file as of 12/04/2020.    Patient Active Problem List   Diagnosis Date Noted   Prediabetes 12/05/2020   Controlled substance agreement signed 12/05/2020   Situational depression 12/04/2020   Insomnia 06/03/2020   Hyperlipidemia 03/22/2019   GERD (gastroesophageal reflux disease) 04/11/2018   Allergic rhinitis 12/09/2016   Ankle instability 03/07/2015   Vitamin D deficiency 12/04/2013   Herpes simplex type 1 infection    Hypertension    S/P right inguinal hernia repair-Davol 3D max mesh 2003 08/05/2012   S/P  arthroscopy of right shoulder 08/05/2012    Past Medical History:  Diagnosis Date   Broken ankle 1/16   will have surgery on 01/30/15-right ankle   Elevated hemoglobin A1c 01/17/15   6.0   Endometriosis    Herpes simplex type 1 infection    Hypertension    Migraine    h/o migraines, none since hysterectomy   Situational depression    Varicose vein    surgery on right leg    Relevant past medical, surgical, family and social history reviewed and updated as indicated. Interim medical history since our last visit reviewed.  Review of Systems Per HPI unless specifically indicated above     Objective:    BP (!) 142/100   Pulse 71   Temp 98.5 F (36.9 C)   Ht 5' 4.25" (1.632 m)   Wt 164 lb 3.2 oz (74.5 kg)   LMP 04/27/1992   SpO2 98%   BMI 27.97 kg/m   Wt Readings from Last 3 Encounters:  12/04/20 164 lb 3.2 oz (74.5 kg)  09/11/20 161 lb (73 kg)  06/03/20 162 lb (73.5 kg)    Physical Exam Vitals and nursing note reviewed.  Constitutional:      General: She is not in acute distress.    Appearance: Normal appearance. She is not toxic-appearing.  Cardiovascular:     Rate and Rhythm: Normal rate and regular rhythm.     Heart sounds: Normal heart sounds. No murmur  heard. Pulmonary:     Effort: Pulmonary effort is normal. No respiratory distress.     Breath sounds: Normal breath sounds. No wheezing, rhonchi or rales.  Musculoskeletal:        General: Normal range of motion.     Right lower leg: No edema.     Left lower leg: No edema.  Skin:    General: Skin is warm and dry.     Capillary Refill: Capillary refill takes less than 2 seconds.     Coloration: Skin is not jaundiced or pale.     Findings: No erythema.  Neurological:     Mental Status: She is alert and oriented to person, place, and time.     Motor: No weakness.     Gait: Gait normal.  Psychiatric:        Mood and Affect: Mood normal.        Behavior: Behavior normal.        Thought Content: Thought content normal.        Judgment: Judgment normal.      Assessment & Plan:   Problem List Items Addressed This Visit       Cardiovascular and Mediastinum   Hypertension    Chronic.  BP elevated above goal of less than 130/80 today in clinic.  Encouraged checking BP at home about 1 hour after taking medication.  Notify us if readings consistently >130/80.  She is taking benazepril 40 mg, will check electrolytes and kidney function.  If BP remains elevated, can consider adding HCTZ.  Follow up in 6 weeks.      Relevant Medications   benazepril (LOTENSIN) 40 MG tablet     Digestive   GERD (gastroesophageal reflux disease)    Stable.  Will check CBC.        Other   Vitamin D deficiency    Will check Vitamin D.  Continue daily replacement with 4000-5000 IU daily for now.      Relevant Orders   VITAMIN D 25 Hydroxy (Vit-D  Deficiency, Fractures)   Situational depression    Acute.  PHQ-9 and GAD-7 elevated today.  No SI/HI.  Discussed medication versus watchful waiting.  Patient elects to proceed with starting medication secondary to increased stress and holiday time coming up which is also stressful.  Discussed SSRIs-side effects and that it may take up to 6 weeks before feels  benefit from medication.  We will start citalopram 10 mg daily and follow-up in 6 weeks.      Relevant Medications   ALPRAZolam (XANAX) 0.5 MG tablet   citalopram (CELEXA) 10 MG tablet   Prediabetes    Chronic.  We will check hemoglobin A1c when patient returns for fasting lab work.  Continue dietary and lifestyle changes including limiting simple sugars and carbohydrates.  Physical activity goal is 30 minutes 5 times weekly.      Relevant Orders   Hemoglobin A1c   Insomnia - Primary    Acute on chronic.  PDMP reviewed and appropriate-we will refill alprazolam to help with insomnia.  She does not plan to take this nightly, a few times weekly at most.  She understands that this can be habit-forming if taken every day.  Controlled substance agreement signed today.  Refill for alprazolam 0.5 mg given.  Should last until next visit.      Relevant Medications   ALPRAZolam (XANAX) 0.5 MG tablet   Hyperlipidemia    Chronic.  She has been working on dietary lifestyle changes consistently.  She takes red yeast rice.  We will have patient return for fasting lab work. Discussed The 10-year ASCVD risk score Denman George DC Jr., et al., 2013) is: 7.7%   Values used to calculate the score:     Age: 20 years     Sex: Female     Is Non-Hispanic African American: No     Diabetic: No     Tobacco smoker: No     Systolic Blood Pressure: 142 mmHg     Is BP treated: Yes     HDL Cholesterol: 78 mg/dL     Total Cholesterol: 239 mg/dL If recalculation with fasting labs greater than 7.5%, discussed that I will recommend starting a statin at that time.       Relevant Medications   benazepril (LOTENSIN) 40 MG tablet   Other Relevant Orders   COMPLETE METABOLIC PANEL WITH GFR   Lipid panel   CBC with Differential/Platelet   Controlled substance agreement signed    For alprazolam 0.5 mg #30; 0 refills.  Uses sparingly.        Follow up plan: Return in about 6 weeks (around 01/15/2021) for mood follow  up.

## 2020-12-05 ENCOUNTER — Encounter: Payer: Self-pay | Admitting: Nurse Practitioner

## 2020-12-05 DIAGNOSIS — R7303 Prediabetes: Secondary | ICD-10-CM | POA: Insufficient documentation

## 2020-12-05 DIAGNOSIS — Z79899 Other long term (current) drug therapy: Secondary | ICD-10-CM | POA: Insufficient documentation

## 2020-12-05 NOTE — Assessment & Plan Note (Signed)
For alprazolam 0.5 mg #30; 0 refills.  Uses sparingly.

## 2020-12-05 NOTE — Assessment & Plan Note (Signed)
Stable.  Will check CBC.

## 2020-12-05 NOTE — Assessment & Plan Note (Signed)
Acute.  PHQ-9 and GAD-7 elevated today.  No SI/HI.  Discussed medication versus watchful waiting.  Patient elects to proceed with starting medication secondary to increased stress and holiday time coming up which is also stressful.  Discussed SSRIs-side effects and that it may take up to 6 weeks before feels benefit from medication.  We will start citalopram 10 mg daily and follow-up in 6 weeks.

## 2020-12-05 NOTE — Assessment & Plan Note (Signed)
Chronic.  We will check hemoglobin A1c when patient returns for fasting lab work.  Continue dietary and lifestyle changes including limiting simple sugars and carbohydrates.  Physical activity goal is 30 minutes 5 times weekly.

## 2020-12-05 NOTE — Assessment & Plan Note (Signed)
Will check Vitamin D.  Continue daily replacement with 4000-5000 IU daily for now.

## 2020-12-05 NOTE — Assessment & Plan Note (Signed)
Chronic.  BP elevated above goal of less than 130/80 today in clinic.  Encouraged checking BP at home about 1 hour after taking medication.  Notify us if readings consistently >130/80.  She is taking benazepril 40 mg, will check electrolytes and kidney function.  If BP remains elevated, can consider adding HCTZ.  Follow up in 6 weeks.

## 2020-12-05 NOTE — Assessment & Plan Note (Signed)
Acute on chronic.  PDMP reviewed and appropriate-we will refill alprazolam to help with insomnia.  She does not plan to take this nightly, a few times weekly at most.  She understands that this can be habit-forming if taken every day.  Controlled substance agreement signed today.  Refill for alprazolam 0.5 mg given.  Should last until next visit.

## 2020-12-05 NOTE — Assessment & Plan Note (Signed)
Chronic.  She has been working on dietary lifestyle changes consistently.  She takes red yeast rice.  We will have patient return for fasting lab work. Discussed The 10-year ASCVD risk score Denman George DC Jr., et al., 2013) is: 7.7%   Values used to calculate the score:     Age: 65 years     Sex: Female     Is Non-Hispanic African American: No     Diabetic: No     Tobacco smoker: No     Systolic Blood Pressure: 142 mmHg     Is BP treated: Yes     HDL Cholesterol: 78 mg/dL     Total Cholesterol: 239 mg/dL If recalculation with fasting labs greater than 7.5%, discussed that I will recommend starting a statin at that time.

## 2020-12-23 ENCOUNTER — Ambulatory Visit
Admission: RE | Admit: 2020-12-23 | Discharge: 2020-12-23 | Disposition: A | Discharge status: Home / Self Care | Payer: 59 | Source: Ambulatory Visit | Attending: Obstetrics & Gynecology | Admitting: Obstetrics & Gynecology

## 2020-12-23 ENCOUNTER — Other Ambulatory Visit: Payer: Self-pay

## 2020-12-23 DIAGNOSIS — Z1231 Encounter for screening mammogram for malignant neoplasm of breast: Secondary | ICD-10-CM

## 2021-01-15 ENCOUNTER — Ambulatory Visit: Payer: 59 | Admitting: Nurse Practitioner

## 2021-01-17 ENCOUNTER — Other Ambulatory Visit: Payer: Self-pay

## 2021-01-17 ENCOUNTER — Encounter: Payer: Self-pay | Admitting: Nurse Practitioner

## 2021-01-17 ENCOUNTER — Ambulatory Visit: Payer: 59 | Admitting: Nurse Practitioner

## 2021-01-17 VITALS — BP 128/80 | HR 80 | Temp 98.3°F | Ht 64.0 in | Wt 165.6 lb

## 2021-01-17 DIAGNOSIS — F418 Other specified anxiety disorders: Secondary | ICD-10-CM | POA: Diagnosis not present

## 2021-01-17 DIAGNOSIS — F4321 Adjustment disorder with depressed mood: Secondary | ICD-10-CM | POA: Diagnosis not present

## 2021-01-17 DIAGNOSIS — I10 Essential (primary) hypertension: Secondary | ICD-10-CM | POA: Diagnosis not present

## 2021-01-17 MED ORDER — CITALOPRAM HYDROBROMIDE 20 MG PO TABS: 20.0000 mg | ORAL_TABLET | Freq: Every day | ORAL | 1 refills | Status: DC

## 2021-01-17 NOTE — Assessment & Plan Note (Signed)
Chronic, improved.  PHQ-9 and GAD-7 are both improved today.  No SI/HI.  Will increase citalopram to 20 mg daily and continue with as needed alprazolam.  Refill given for citalopram today.  Patient to call and let me know if citalopram 20 mg is not working well and we will set up a time to discuss.  Follow up in 3-6 months.  

## 2021-01-17 NOTE — Progress Notes (Signed)
Subjective:    Patient ID: [name], female    DOB: May 25, 1955, 65 y.o.   MRN: [id number]  HPI: [name] is a 65 y.o. female presenting for blood pressure and mood follow up.  Chief Complaint  Patient presents with   Follow-up   SITUATIONAL ANXIETY/DEPRESSION Feels like citalopram 10 mg is making a good difference.  She has been having fewer and father in-between panic attacks.  She is also sleeping better at night.  Mood status: better Satisfied with current treatment?: yes Symptom severity: moderate  Duration of current treatment : months Side effects: no Medication compliance: excellent compliance Psychotherapy/counseling: no Depressed mood: yes Anxious mood: yes Anhedonia: yes Significant weight loss or gain: no Insomnia: yes Fatigue: yes Feelings of worthlessness or guilt: yes Impaired concentration/indecisiveness: yes Suicidal ideations: no Hopelessness: no Crying spells: no Depression screen [facility] 2/9 01/17/2021 12/04/2020 06/03/2020 02/23/2020 10/19/2018  Decreased Interest 1 2 0 0 1  Down, Depressed, Hopeless 1 2 0 0 0  PHQ - 2 Score 2 4 0 0 1  Altered sleeping 1 3 - - -  Tired, decreased energy 2 2 - - -  Change in appetite 0 2 - - -  Feeling bad or failure about yourself  2 1 - - -  Trouble concentrating 2 2 - - -  Moving slowly or fidgety/restless 2 2 - - -  Suicidal thoughts 0 0 - - -  PHQ-9 Score 11 16 - - -  Difficult doing work/chores - Somewhat difficult - - -    GAD 7 : Generalized Anxiety Score 01/17/2021 12/04/2020  Nervous, Anxious, on Edge 1 2  Control/stop worrying 1 1  Worry too much - different things 1 2  Trouble relaxing 2 2  Restless 1 2  Easily annoyed or irritable 0 2  Afraid - awful might happen 1 1  Total GAD 7 Score 7 12  Anxiety Difficulty Somewhat difficult Somewhat difficult    HYPERTENSION Hypertension status: controlled  BP monitoring frequency:  a few times a week BP range: 120s/70s Medication compliance:  excellent Aspirin: yes Recurrent headaches: no Visual changes: no Palpitations: no Dyspnea: no Chest pain: no Lower extremity edema: no Dizzy/lightheaded: no  Allergies  Allergen Reactions   Betadine [Povidone Iodine] Itching   Codeine    Other     Band aids-itchy, red rash    Outpatient Encounter Medications as of 01/17/2021  Medication Sig   citalopram (CELEXA) 20 MG tablet Take 1 tablet (20 mg total) by mouth daily.   acyclovir (ZOVIRAX) 400 MG tablet Take 1 tablet (400 mg total) by mouth 3 (three) times daily. (Patient not taking: Reported on 12/04/2020)   ALPRAZolam (XANAX) 0.5 MG tablet Take 1 tablet (0.5 mg total) by mouth at bedtime as needed for sleep.   aspirin EC 325 MG tablet Take 1 tablet (325 mg total) by mouth daily. (Patient taking differently: Take 325 mg by mouth every other day.)   benazepril (LOTENSIN) 40 MG tablet TAKE 1 TABLET  BY MOUTH DAILY   CALCIUM PO Take by mouth daily.   Cholecalciferol (VITAMIN D3) 125 MCG (5000 UT) CAPS 1 capsule daily   Coenzyme Q10 (COQ-10 PO) Take by mouth.   estradiol (ESTRACE) 0.5 MG tablet Take 1 tablet (0.5 mg total) by mouth daily.   KRILL OIL PO Take by mouth. Omega red krill oil   levocetirizine (XYZAL) 5 MG tablet Take 1 tablet (5 mg total) by mouth every evening.   meloxicam (  MOBIC) 15 MG tablet Take 1 tablet (15 mg total) by mouth daily. (Patient not taking: Reported on 12/04/2020)   Red Yeast Rice Extract (RED YEAST RICE PO) Take by mouth daily.   vitamin C (ASCORBIC ACID) 500 MG tablet Take 1,000 mg by mouth 3 (three) times daily.   Vitamin D, Ergocalciferol, (DRISDOL) 1.25 MG (50000 UNIT) CAPS capsule TAKE 1 (ONE) TABLET 50,000U WEEKLY FOR 12 WEEKS.   [DISCONTINUED] citalopram (CELEXA) 10 MG tablet Take 1 tablet (10 mg total) by mouth daily.   No facility-administered encounter medications on file as of 01/17/2021.    Patient Active Problem List   Diagnosis Date Noted   Situational anxiety 01/17/2021   Prediabetes  12/05/2020   Controlled substance agreement signed 12/05/2020   Situational depression 12/04/2020   Insomnia 06/03/2020   Hyperlipidemia 03/22/2019   GERD (gastroesophageal reflux disease) 04/11/2018   Allergic rhinitis 12/09/2016   Ankle instability 03/07/2015   Vitamin D deficiency 12/04/2013   Herpes simplex type 1 infection    Hypertension    S/P right inguinal hernia repair-Davol 3D max mesh 2003 08/05/2012   S/P arthroscopy of right shoulder 08/05/2012    Past Medical History:  Diagnosis Date   Broken ankle 1/16   will have surgery on 01/30/15-right ankle   Elevated hemoglobin A1c 01/17/15   6.0   Endometriosis    Herpes simplex type 1 infection    Hypertension    Migraine    h/o migraines, none since hysterectomy   Situational depression    Varicose vein    surgery on right leg    Relevant past medical, surgical, family and social history reviewed and updated as indicated. Interim medical history since our last visit reviewed.  Review of Systems Per HPI unless specifically indicated above     Objective:    BP 128/80   Pulse 80   Temp 98.3 F (36.8 C) (Oral)   Ht 5\' 4"  (1.626 m)   Wt 165 lb 9.6 oz (75.1 kg)   LMP 04/27/1992   SpO2 98%   BMI 28.43 kg/m   Wt Readings from Last 3 Encounters:  01/17/21 165 lb 9.6 oz (75.1 kg)  12/04/20 164 lb 3.2 oz (74.5 kg)  09/11/20 161 lb (73 kg)    Physical Exam Vitals and nursing note reviewed.  Constitutional:      General: She is not in acute distress.    Appearance: Normal appearance. She is not toxic-appearing.  Eyes:     General: No scleral icterus.    Extraocular Movements: Extraocular movements intact.  Skin:    General: Skin is warm and dry.     Coloration: Skin is not jaundiced or pale.     Findings: No erythema.  Neurological:     Mental Status: She is alert and oriented to person, place, and time.     Motor: No weakness.     Gait: Gait normal.  Psychiatric:        Mood and Affect: Mood normal.         Behavior: Behavior normal.        Thought Content: Thought content normal.        Judgment: Judgment normal.      Assessment & Plan:   Problem List Items Addressed This Visit       Cardiovascular and Mediastinum   Hypertension    Chronic.  BP is much improved and at goal today in clinic.  The majority of her home readings are at goal as  well.  Continue benazepril 40 mg daily and follow up in 6 months.  Patient will return for fasting blood work next week.        Other   Situational depression    Chronic, improved.  PHQ-9 and GAD-7 are both improved today.  No SI/HI.  Will increase citalopram to 20 mg daily and continue with as needed alprazolam.  Refill given for citalopram today.  Patient to call and let me know if citalopram 20 mg is not working well and we will set up a time to discuss.  Follow up in 3-6 months.       Relevant Medications   citalopram (CELEXA) 20 MG tablet   Situational anxiety - Primary    Chronic, improved.  PHQ-9 and GAD-7 are both improved today.  No SI/HI.  Will increase citalopram to 20 mg daily and continue with as needed alprazolam.  Refill given for citalopram today.  Patient to call and let me know if citalopram 20 mg is not working well and we will set up a time to discuss.  Follow up in 3-6 months.       Relevant Medications   citalopram (CELEXA) 20 MG tablet     Follow up plan: Return for 3-6 months mood f/u.

## 2021-01-17 NOTE — Assessment & Plan Note (Signed)
Chronic, improved.  PHQ-9 and GAD-7 are both improved today.  No SI/HI.  Will increase citalopram to 20 mg daily and continue with as needed alprazolam.  Refill given for citalopram today.  Patient to call and let me know if citalopram 20 mg is not working well and we will set up a time to discuss.  Follow up in 3-6 months.

## 2021-01-17 NOTE — Assessment & Plan Note (Signed)
Chronic.  BP is much improved and at goal today in clinic.  The majority of her home readings are at goal as well.  Continue benazepril 40 mg daily and follow up in 6 months.  Patient will return for fasting blood work next week.

## 2021-03-27 ENCOUNTER — Ambulatory Visit (HOSPITAL_BASED_OUTPATIENT_CLINIC_OR_DEPARTMENT_OTHER): Payer: 59 | Admitting: Obstetrics & Gynecology

## 2021-03-31 ENCOUNTER — Encounter: Payer: Self-pay | Admitting: Nurse Practitioner

## 2021-03-31 ENCOUNTER — Ambulatory Visit (INDEPENDENT_AMBULATORY_CARE_PROVIDER_SITE_OTHER): Payer: 59 | Admitting: Obstetrics & Gynecology

## 2021-03-31 ENCOUNTER — Encounter (HOSPITAL_BASED_OUTPATIENT_CLINIC_OR_DEPARTMENT_OTHER): Payer: Self-pay | Admitting: Obstetrics & Gynecology

## 2021-03-31 ENCOUNTER — Ambulatory Visit: Payer: 59 | Admitting: Nurse Practitioner

## 2021-03-31 ENCOUNTER — Other Ambulatory Visit: Payer: Self-pay

## 2021-03-31 VITALS — BP 160/100 | HR 74 | Ht 64.0 in | Wt 166.8 lb

## 2021-03-31 VITALS — BP 154/85 | HR 79 | Ht 64.25 in | Wt 167.2 lb

## 2021-03-31 DIAGNOSIS — Z90710 Acquired absence of both cervix and uterus: Secondary | ICD-10-CM

## 2021-03-31 DIAGNOSIS — E559 Vitamin D deficiency, unspecified: Secondary | ICD-10-CM

## 2021-03-31 DIAGNOSIS — N951 Menopausal and female climacteric states: Secondary | ICD-10-CM

## 2021-03-31 DIAGNOSIS — Z79890 Hormone replacement therapy (postmenopausal): Secondary | ICD-10-CM

## 2021-03-31 DIAGNOSIS — I10 Essential (primary) hypertension: Secondary | ICD-10-CM | POA: Diagnosis not present

## 2021-03-31 DIAGNOSIS — F418 Other specified anxiety disorders: Secondary | ICD-10-CM | POA: Diagnosis not present

## 2021-03-31 DIAGNOSIS — R7303 Prediabetes: Secondary | ICD-10-CM

## 2021-03-31 DIAGNOSIS — Z01419 Encounter for gynecological examination (general) (routine) without abnormal findings: Secondary | ICD-10-CM | POA: Diagnosis not present

## 2021-03-31 DIAGNOSIS — E785 Hyperlipidemia, unspecified: Secondary | ICD-10-CM

## 2021-03-31 DIAGNOSIS — F4321 Adjustment disorder with depressed mood: Secondary | ICD-10-CM

## 2021-03-31 DIAGNOSIS — F5102 Adjustment insomnia: Secondary | ICD-10-CM

## 2021-03-31 MED ORDER — ALPRAZOLAM 0.5 MG PO TABS: 0.2500 mg | ORAL_TABLET | Freq: Every evening | ORAL | 0 refills | Status: DC | PRN

## 2021-03-31 MED ORDER — ESTRADIOL 0.5 MG PO TABS: 0.5000 mg | ORAL_TABLET | Freq: Every day | ORAL | 4 refills | Status: DC

## 2021-03-31 MED ORDER — BENAZEPRIL HCL 40 MG PO TABS: 40.0000 mg | ORAL_TABLET | Freq: Every day | ORAL | 1 refills | Status: DC

## 2021-03-31 MED ORDER — CITALOPRAM HYDROBROMIDE 40 MG PO TABS: 40.0000 mg | ORAL_TABLET | Freq: Every day | ORAL | 1 refills | Status: DC

## 2021-03-31 NOTE — Assessment & Plan Note (Signed)
Chronic, stable.  She uses alprazolam 0.5 mg 1/2 tablet as needed to help with sleep.  She does not take this at least 3 times weekly because she stays with her mother who is chronically sick.  PDMP reviewed and appropriate-refill given for this medication.  Controlled substance agreement has been signed in the past.  Needs UDS next visit.  Follow-up 3 to 6 months.

## 2021-03-31 NOTE — Assessment & Plan Note (Signed)
Chronic.  Check hemoglobin A1c today.  Continue dietary lifestyle changes.  Physical activity goal is 30 minutes 5 times weekly.  She has been having a tough time getting in physical activity because of caring for her mother who is chronically sick.

## 2021-03-31 NOTE — Assessment & Plan Note (Signed)
Check vitamin D level today.  Continue replacement with vitamin D3 5000 IU daily for now.  Follow-up 6 months.

## 2021-03-31 NOTE — Assessment & Plan Note (Signed)
Chronic.  Blood pressure is elevated today in clinic.  I have asked the patient to check her blood pressure at home and notify us if readings are consistently greater than 140/90.  Plan to continue benazepril 40 mg daily.  Check kidney function with electrolytes today.  Follow-up 6 months unless blood pressure running high at home.

## 2021-03-31 NOTE — Progress Notes (Signed)
Subjective:    Patient ID: [name], female    DOB: 09/02/1955, 65 y.o.   MRN: [id number]  HPI: [name] is a 65 y.o. female presenting for follow up.  Chief Complaint  Patient presents with   Follow-up    Medication follow up patient states stable   ANXIETY/STRESS/INSOMNIA Currently taking citalopram 20 mg daily and as needed alprazolam 0.5 mg 1/2 tablet.  Did not sleep well last night - has a lot on her mind with the holidays and her mother who is chronically sick. She and 1 of her sisters provide 24/7 care for her.  She also provides care for her daughter who has spina bifida. Duration: chronic Anxious mood: yes  Excessive worrying: yes Irritability: no Sweating: no Nausea: no Palpitations:no Hyperventilation: no Panic attacks: no Agoraphobia: no  Obscessions/compulsions: no Depressed mood: yes Depression screen [facility] 2/9 03/31/2021 01/17/2021 12/04/2020 06/03/2020 02/23/2020  Decreased Interest 1 1 2  0 0  Down, Depressed, Hopeless 1 1 2  0 0  PHQ - 2 Score 2 2 4  0 0  Altered sleeping 1 1 3  - -  Tired, decreased energy 1 2 2  - -  Change in appetite 1 0 2 - -  Feeling bad or failure about yourself  1 2 1  - -  Trouble concentrating 2 2 2  - -  Moving slowly or fidgety/restless 1 2 2  - -  Suicidal thoughts 0 0 0 - -  PHQ-9 Score 9 11 16  - -  Difficult doing work/chores Somewhat difficult - Somewhat difficult - -    GAD 7 : Generalized Anxiety Score 03/31/2021 01/17/2021 12/04/2020  Nervous, Anxious, on Edge 1 1 2   Control/stop worrying 1 1 1   Worry too much - different things 1 1 2   Trouble relaxing 2 2 2   Restless 1 1 2   Easily annoyed or irritable 0 0 2  Afraid - awful might happen 1 1 1   Total GAD 7 Score 7 7 12   Anxiety Difficulty Somewhat difficult Somewhat difficult Somewhat difficult    Anhedonia: yes Weight changes: no Insomnia: yes hard to stay asleep  Hypersomnia: no Fatigue/loss of energy: yes Feelings of worthlessness: yes Feelings of guilt:  yes Impaired concentration/indecisiveness: yes Suicidal ideations: no  Crying spells: no Recent Stressors/Life Changes: yes   Relationship problems: no   Family stress: yes     Financial stress: no    Job stress: no    Recent death/loss: no  HYPERTENSION/HYPERLIPIDEMIA Took BP medication today - currently taking benazepril 40 mg daily.  Has not been checking her BP at home as much as she was but plans to start back.  She is feeling very anxious and stressed today. Hypertension status: elevated today in office  BP monitoring frequency:  rarely Medication compliance: good compliance - misses doses sometimes and can "feel" it with headache Aspirin: yes Recurrent headaches: no Visual changes: no Palpitations: no Dyspnea: no Chest pain: no Lower extremity edema: no Dizzy/lightheaded: no  VITAMIN D DEFICIENCY Duration: chronic Previous Vitamin D level: 27 in 05/2020 Current supplementation: Vitamin D3 5000 IU daily  Allergies  Allergen Reactions   Betadine [Povidone Iodine] Itching   Codeine    Other     Band aids-itchy, red rash    Outpatient Encounter Medications as of 03/31/2021  Medication Sig   acyclovir (ZOVIRAX) 400 MG tablet Take 1 tablet (400 mg total) by mouth 3 (three) times daily.   aspirin EC 325 MG tablet Take 1 tablet (325 mg  total) by mouth daily. (Patient taking differently: Take 325 mg by mouth every other day.)   CALCIUM PO Take by mouth daily.   Cholecalciferol (VITAMIN D3) 125 MCG (5000 UT) CAPS 1 capsule daily   citalopram (CELEXA) 40 MG tablet Take 1 tablet (40 mg total) by mouth daily.   Coenzyme Q10 (COQ-10 PO) Take by mouth.   estradiol (ESTRACE) 0.5 MG tablet Take 1 tablet (0.5 mg total) by mouth daily.   KRILL OIL PO Take by mouth. Omega red krill oil   levocetirizine (XYZAL) 5 MG tablet Take 1 tablet (5 mg total) by mouth every evening.   meloxicam (MOBIC) 15 MG tablet Take 1 tablet (15 mg total) by mouth daily.   Red Yeast Rice Extract (RED  YEAST RICE PO) Take by mouth daily.   vitamin C (ASCORBIC ACID) 500 MG tablet Take 1,000 mg by mouth 3 (three) times daily.   Vitamin D, Ergocalciferol, (DRISDOL) 1.25 MG (50000 UNIT) CAPS capsule TAKE 1 (ONE) TABLET 50,000U WEEKLY FOR 12 WEEKS.   [DISCONTINUED] ALPRAZolam (XANAX) 0.5 MG tablet Take 1 tablet (0.5 mg total) by mouth at bedtime as needed for sleep.   [DISCONTINUED] benazepril (LOTENSIN) 40 MG tablet TAKE 1 TABLET  BY MOUTH DAILY   [DISCONTINUED] citalopram (CELEXA) 20 MG tablet Take 1 tablet (20 mg total) by mouth daily.   ALPRAZolam (XANAX) 0.5 MG tablet Take 0.5 tablets (0.25 mg total) by mouth at bedtime as needed for sleep.   benazepril (LOTENSIN) 40 MG tablet Take 1 tablet (40 mg total) by mouth daily.   No facility-administered encounter medications on file as of 03/31/2021.    Patient Active Problem List   Diagnosis Date Noted   Situational anxiety 01/17/2021   Prediabetes 12/05/2020   Controlled substance agreement signed 12/05/2020   Situational depression 12/04/2020   Insomnia 06/03/2020   Hyperlipidemia 03/22/2019   GERD (gastroesophageal reflux disease) 04/11/2018   Allergic rhinitis 12/09/2016   Ankle instability 03/07/2015   Vitamin D deficiency 12/04/2013   Herpes simplex type 1 infection    Hypertension    S/P right inguinal hernia repair-Davol 3D max mesh 2003 08/05/2012   S/P arthroscopy of right shoulder 08/05/2012    Past Medical History:  Diagnosis Date   Broken ankle 1/16   will have surgery on 01/30/15-right ankle   Elevated hemoglobin A1c 01/17/15   6.0   Endometriosis    Herpes simplex type 1 infection    Hypertension    Migraine    h/o migraines, none since hysterectomy   Situational depression    Varicose vein    surgery on right leg    Relevant past medical, surgical, family and social history reviewed and updated as indicated. Interim medical history since our last visit reviewed.  Review of Systems Per HPI unless specifically  indicated above     Objective:    BP (!) 160/100   Pulse 74   Ht 5\' 4"  (1.626 m)   Wt 166 lb 12.8 oz (75.7 kg)   LMP 04/27/1992   SpO2 99%   BMI 28.63 kg/m   Wt Readings from Last 3 Encounters:  03/31/21 166 lb 12.8 oz (75.7 kg)  01/17/21 165 lb 9.6 oz (75.1 kg)  12/04/20 164 lb 3.2 oz (74.5 kg)    Physical Exam Vitals and nursing note reviewed.  Constitutional:      General: She is not in acute distress.    Appearance: Normal appearance. She is not toxic-appearing.  HENT:     Head:  Normocephalic and atraumatic.  Eyes:     General: No scleral icterus.       Right eye: No discharge.        Left eye: No discharge.     Extraocular Movements: Extraocular movements intact.  Cardiovascular:     Rate and Rhythm: Normal rate and regular rhythm.     Pulses: Normal pulses.     Heart sounds: Normal heart sounds. No murmur heard. Pulmonary:     Effort: Pulmonary effort is normal. No respiratory distress.     Breath sounds: No wheezing or rhonchi.  Musculoskeletal:     Right lower leg: No edema.     Left lower leg: No edema.  Skin:    General: Skin is warm and dry.     Coloration: Skin is not jaundiced or pale.     Findings: No erythema.  Neurological:     Mental Status: She is alert and oriented to person, place, and time.     Motor: No weakness.     Gait: Gait normal.  Psychiatric:        Mood and Affect: Mood normal.        Behavior: Behavior normal.        Thought Content: Thought content normal.        Judgment: Judgment normal.      Assessment & Plan:   Problem List Items Addressed This Visit       Cardiovascular and Mediastinum   Hypertension    Chronic.  Blood pressure is elevated today in clinic.  I have asked the patient to check her blood pressure at home and notify us if readings are consistently greater than 140/90.  Plan to continue benazepril 40 mg daily.  Check kidney function with electrolytes today.  Follow-up 6 months unless blood pressure running  high at home.      Relevant Medications   benazepril (LOTENSIN) 40 MG tablet     Other   Vitamin D deficiency    Check vitamin D level today.  Continue replacement with vitamin D3 5000 IU daily for now.  Follow-up 6 months.      Situational depression    Chronic, PHQ-9 and GAD-7 slightly elevated today.  No SI/HI.  Increase citalopram to 40 mg daily and continue as needed alprazolam for sleep.  Follow-up in 3 to 6 months or sooner if increasing citalopram does not help with her mood.      Relevant Medications   citalopram (CELEXA) 40 MG tablet   ALPRAZolam (XANAX) 0.5 MG tablet   Situational anxiety - Primary    Chronic, PHQ-9 and GAD-7 slightly elevated today.  No SI/HI.  Increase citalopram to 40 mg daily and continue as needed alprazolam for sleep.  Follow-up in 3 to 6 months or sooner if increasing citalopram does not help with her mood.      Relevant Medications   citalopram (CELEXA) 40 MG tablet   ALPRAZolam (XANAX) 0.5 MG tablet   Prediabetes    Chronic.  Check hemoglobin A1c today.  Continue dietary lifestyle changes.  Physical activity goal is 30 minutes 5 times weekly.  She has been having a tough time getting in physical activity because of caring for her mother who is chronically sick.      Insomnia    Chronic, stable.  She uses alprazolam 0.5 mg 1/2 tablet as needed to help with sleep.  She does not take this at least 3 times weekly because she stays with her mother who  is chronically sick.  PDMP reviewed and appropriate-refill given for this medication.  Controlled substance agreement has been signed in the past.  Needs UDS next visit.  Follow-up 3 to 6 months.      Relevant Medications   ALPRAZolam (XANAX) 0.5 MG tablet   Hyperlipidemia    Chronic.  Patient is not fasting today, however will check lipids today.  She has been working on dietary and lifestyle changes.  Continue this work.  Follow-up pending results from cholesterol testing.      Relevant  Medications   benazepril (LOTENSIN) 40 MG tablet     Follow up plan: Return in about 6 months (around 09/29/2021) for follow up.

## 2021-03-31 NOTE — Assessment & Plan Note (Signed)
Chronic, PHQ-9 and GAD-7 slightly elevated today.  No SI/HI.  Increase citalopram to 40 mg daily and continue as needed alprazolam for sleep.  Follow-up in 3 to 6 months or sooner if increasing citalopram does not help with her mood.

## 2021-03-31 NOTE — Progress Notes (Signed)
65 y.o. G2P2 Married White or Caucasian female here for annual exam.  Mother had a significant care accident in 2020.  She is at her home but someone is with her all of the time.  This has been a stressor.  Pt's 15 yo sister died of heart valve issues that initially came from having a pedicure.    Pt reports she's had a lot of anxiety this year.  Citalopram was increased to 40mg  today.    Denies vaginal bleeding.    Patient's last menstrual period was 04/27/1992.          The current method of family planning is status post hysterectomy.    Exercising: Yes.     Smoker:  no  Health Maintenance: Pap:  2012 History of abnormal Pap:  no MMG:  12/23/2020 Negative Colonoscopy:  09/28/2012, follow up 10 years BMD:   10/23/2019 , -1.2 Screening Labs: done today   reports that she has never smoked. She has never used smokeless tobacco. She reports current alcohol use of about 2.0 - 3.0 standard drinks per week. She reports that she does not use drugs.  Past Medical History:  Diagnosis Date   Broken ankle 1/16   will have surgery on 01/30/15-right ankle   Elevated hemoglobin A1c 01/17/15   6.0   Endometriosis    Herpes simplex type 1 infection    Hypertension    Migraine    h/o migraines, none since hysterectomy   Situational depression    Varicose vein    surgery on right leg    Past Surgical History:  Procedure Laterality Date   ABDOMINAL HYSTERECTOMY  1994   LAVH/RSO   ANKLE SURGERY Right 01/30/2015   BREAST BIOPSY Left    CESAREAN SECTION     CHOLECYSTECTOMY     COLON RESECTION  11/06   and lap LSO   CYSTECTOMY     HERNIA REPAIR  1982   HERNIA REPAIR  2003   and bladder repair   SHOULDER SURGERY Right 04/2017   Tendon repair     Current Outpatient Medications  Medication Sig Dispense Refill   acyclovir (ZOVIRAX) 400 MG tablet Take 1 tablet (400 mg total) by mouth 3 (three) times daily. 30 tablet 1   ALPRAZolam (XANAX) 0.5 MG tablet Take 0.5 tablets (0.25 mg total) by  mouth at bedtime as needed for sleep. 60 tablet 0   aspirin EC 325 MG tablet Take 1 tablet (325 mg total) by mouth daily. (Patient taking differently: Take 325 mg by mouth every other day.) 30 tablet 0   benazepril (LOTENSIN) 40 MG tablet Take 1 tablet (40 mg total) by mouth daily. 90 tablet 1   CALCIUM PO Take by mouth daily.     Cholecalciferol (VITAMIN D3) 125 MCG (5000 UT) CAPS 1 capsule daily 30 capsule 0   citalopram (CELEXA) 40 MG tablet Take 1 tablet (40 mg total) by mouth daily. 90 tablet 1   Coenzyme Q10 (COQ-10 PO) Take by mouth.     KRILL OIL PO Take by mouth. Omega red krill oil     levocetirizine (XYZAL) 5 MG tablet Take 1 tablet (5 mg total) by mouth every evening. 90 tablet 1   meloxicam (MOBIC) 15 MG tablet Take 1 tablet (15 mg total) by mouth daily. 30 tablet 0   Red Yeast Rice Extract (RED YEAST RICE PO) Take by mouth daily.     vitamin C (ASCORBIC ACID) 500 MG tablet Take 1,000 mg by mouth 3 (three)  times daily.     Vitamin D, Ergocalciferol, (DRISDOL) 1.25 MG (50000 UNIT) CAPS capsule TAKE 1 (ONE) TABLET 50,000U WEEKLY FOR 12 WEEKS. 12 capsule 0   estradiol (ESTRACE) 0.5 MG tablet Take 1 tablet (0.5 mg total) by mouth daily. 90 tablet 4   No current facility-administered medications for this visit.    Family History  Problem Relation Age of Onset   Skin cancer Other    Lung cancer Father    Spina bifida Daughter        and hydrocephalia   Diabetes Mother    Hypertension Mother    Thyroid nodules Mother        being watched   Diabetes Sister        x 3   Hypertension Sister    Thyroid nodules Sister     Review of Systems  All other systems reviewed and are negative.  Exam:   BP (!) 154/85 (BP Location: Right Arm, Patient Position: Sitting, Cuff Size: Large)   Pulse 79   Ht 5' 4.25" (1.632 m) Comment: reported  Wt 167 lb 3.2 oz (75.8 kg)   LMP 04/27/1992   BMI 28.48 kg/m   Height: 5' 4.25" (163.2 cm) (reported)  General appearance: alert, cooperative  and appears stated age Head: Normocephalic, without obvious abnormality, atraumatic Neck: no adenopathy, supple, symmetrical, trachea midline and thyroid normal to inspection and palpation Lungs: clear to auscultation bilaterally Breasts: normal appearance, no masses or tenderness Heart: regular rate and rhythm Abdomen: soft, non-tender; bowel sounds normal; no masses,  no organomegaly Extremities: extremities normal, atraumatic, no cyanosis or edema Skin: Skin color, texture, turgor normal. No rashes or lesions Lymph nodes: Cervical, supraclavicular, and axillary nodes normal. No abnormal inguinal nodes palpated Neurologic: Grossly normal   Pelvic: External genitalia:  no lesions              Urethra:  normal appearing urethra with no masses, tenderness or lesions              Bartholins and Skenes: normal                 Vagina: normal appearing vagina with normal color and no discharge, no lesions              Cervix: absent              Pap taken: No. Bimanual Exam:  Uterus:  uterus absent              Adnexa: normal adnexa and no mass, fullness, tenderness               Rectovaginal: Confirms               Anus:  normal sphincter tone, no lesions  Chaperone, Ina Homes, CMA, was present for exam.  Assessment/Plan: 1. Well woman exam with routine gynecological exam - pap smear not indicated - MMG up to date - colonoscopy due 2024 - BMD 2021 - vaccines reviewed and up to date  2. Menopausal syndrome on hormone replacement therapy - estradiol (ESTRACE) 0.5 MG tablet; Take 1 tablet (0.5 mg total) by mouth daily.  Dispense: 90 tablet; Refill: 4  3. H/O total hysterectomy - with RSO and then later LSO  4. Vitamin D deficiency - on OTC Vit D

## 2021-03-31 NOTE — Assessment & Plan Note (Signed)
Chronic.  Patient is not fasting today, however will check lipids today.  She has been working on dietary and lifestyle changes.  Continue this work.  Follow-up pending results from cholesterol testing.

## 2021-04-01 LAB — COMPLETE METABOLIC PANEL WITH GFR
AG Ratio: 2.1 (calc) (ref 1.0–2.5)
ALT: 10 U/L (ref 6–29)
AST: 15 U/L (ref 10–35)
Albumin: 4.2 g/dL (ref 3.6–5.1)
Alkaline phosphatase (APISO): 46 U/L (ref 37–153)
BUN: 14 mg/dL (ref 7–25)
CO2: 27 mmol/L (ref 20–32)
Calcium: 9.2 mg/dL (ref 8.6–10.4)
Chloride: 104 mmol/L (ref 98–110)
Creat: 0.61 mg/dL (ref 0.50–1.05)
Globulin: 2 g/dL (calc) (ref 1.9–3.7)
Glucose, Bld: 98 mg/dL (ref 65–99)
Potassium: 4.1 mmol/L (ref 3.5–5.3)
Sodium: 141 mmol/L (ref 135–146)
Total Bilirubin: 0.4 mg/dL (ref 0.2–1.2)
Total Protein: 6.2 g/dL (ref 6.1–8.1)
eGFR: 100 mL/min/{1.73_m2} (ref >=60)

## 2021-04-01 LAB — CBC WITH DIFFERENTIAL/PLATELET
Absolute Monocytes: 491 cells/uL (ref 200–950)
Basophils Absolute: 59 cells/uL (ref 0–200)
Basophils Relative: 1.1 %
Eosinophils Absolute: 81 cells/uL (ref 15–500)
Eosinophils Relative: 1.5 %
HCT: 41 % (ref 35.0–45.0)
Hemoglobin: 13.3 g/dL (ref 11.7–15.5)
Lymphs Abs: 1679 cells/uL (ref 850–3900)
MCH: 27.4 pg (ref 27.0–33.0)
MCHC: 32.4 g/dL (ref 32.0–36.0)
MCV: 84.5 fL (ref 80.0–100.0)
MPV: 11.7 fL (ref 7.5–12.5)
Monocytes Relative: 9.1 %
Neutro Abs: 3089 cells/uL (ref 1500–7800)
Neutrophils Relative %: 57.2 %
Platelets: 249 10*3/uL (ref 140–400)
RBC: 4.85 10*6/uL (ref 3.80–5.10)
RDW: 13.4 % (ref 11.0–15.0)
Total Lymphocyte: 31.1 %
WBC: 5.4 10*3/uL (ref 3.8–10.8)

## 2021-04-01 LAB — HEMOGLOBIN A1C
Hgb A1c MFr Bld: 5.7 % of total Hgb — ABNORMAL HIGH (ref <5.7)
Mean Plasma Glucose: 117 mg/dL
eAG (mmol/L): 6.5 mmol/L

## 2021-04-01 LAB — LIPID PANEL
Cholesterol: 236 mg/dL — ABNORMAL HIGH (ref <200)
HDL: 78 mg/dL (ref >=50)
LDL Cholesterol (Calc): 134 mg/dL (calc) — ABNORMAL HIGH
Non-HDL Cholesterol (Calc): 158 mg/dL (calc) — ABNORMAL HIGH (ref <130)
Total CHOL/HDL Ratio: 3 (calc) (ref <5.0)
Triglycerides: 126 mg/dL (ref <150)

## 2021-04-01 LAB — VITAMIN D 25 HYDROXY (VIT D DEFICIENCY, FRACTURES): Vit D, 25-Hydroxy: 48 ng/mL (ref 30–100)

## 2021-04-07 ENCOUNTER — Other Ambulatory Visit: Payer: Self-pay

## 2021-04-07 ENCOUNTER — Ambulatory Visit
Admission: EM | Admit: 2021-04-07 | Discharge: 2021-04-07 | Disposition: A | Discharge status: Home / Self Care | Payer: 59 | Attending: Family Medicine | Admitting: Family Medicine

## 2021-04-07 DIAGNOSIS — R112 Nausea with vomiting, unspecified: Secondary | ICD-10-CM

## 2021-04-07 DIAGNOSIS — J069 Acute upper respiratory infection, unspecified: Secondary | ICD-10-CM

## 2021-04-07 DIAGNOSIS — Z20828 Contact with and (suspected) exposure to other viral communicable diseases: Secondary | ICD-10-CM

## 2021-04-07 MED ORDER — ONDANSETRON 4 MG PO TBDP
4.0000 mg | ORAL_TABLET | Freq: Once | ORAL | Status: AC
  Administered 2021-04-07: 4 mg via ORAL

## 2021-04-07 MED ORDER — PROMETHAZINE-DM 6.25-15 MG/5ML PO SYRP: 5.0000 mL | ORAL_SOLUTION | Freq: Four times a day (QID) | ORAL | 0 refills | Status: DC | PRN

## 2021-04-07 MED ORDER — ONDANSETRON 4 MG PO TBDP: 4.0000 mg | ORAL_TABLET | Freq: Three times a day (TID) | ORAL | 0 refills | Status: DC | PRN

## 2021-04-07 MED ORDER — OSELTAMIVIR PHOSPHATE 75 MG PO CAPS: 75.0000 mg | ORAL_CAPSULE | Freq: Two times a day (BID) | ORAL | 0 refills | Status: DC

## 2021-04-07 NOTE — ED Provider Notes (Signed)
[facility]    CSN: [id number] Arrival date & time: 04/07/21  1035      History   Chief Complaint No chief complaint on file.   HPI [name] is a 65 y.o. female.   Patient presenting today with 2-day history of significant headache, cough, body aches, fatigue, chills, nausea, vomiting, congestion.  Denies chest pain, shortness of breath, abdominal pain, diarrhea.  So far trying over-the-counter cold and congestion medications with no relief.  Not tolerating anything by mouth at this time.  Granddaughter diagnosed with the flu on Saturday.   Past Medical History:  Diagnosis Date   Broken ankle 1/16   will have surgery on 01/30/15-right ankle   Elevated hemoglobin A1c 01/17/15   6.0   Endometriosis    Herpes simplex type 1 infection    Hypertension    Migraine    h/o migraines, none since hysterectomy   Situational depression    Varicose vein    surgery on right leg    Patient Active Problem List   Diagnosis Date Noted   Situational anxiety 01/17/2021   Prediabetes 12/05/2020   Controlled substance agreement signed 12/05/2020   Situational depression 12/04/2020   Insomnia 06/03/2020   Hyperlipidemia 03/22/2019   GERD (gastroesophageal reflux disease) 04/11/2018   Allergic rhinitis 12/09/2016   Ankle instability 03/07/2015   Vitamin D deficiency 12/04/2013   Herpes simplex type 1 infection    Hypertension    S/P right inguinal hernia repair-Davol 3D max mesh 2003 08/05/2012   S/P arthroscopy of right shoulder 08/05/2012    Past Surgical History:  Procedure Laterality Date   ABDOMINAL HYSTERECTOMY  1994   LAVH/RSO   ANKLE SURGERY Right 01/30/2015   BREAST BIOPSY Left    CESAREAN SECTION     CHOLECYSTECTOMY     COLON RESECTION  11/06   and lap LSO   CYSTECTOMY     HERNIA REPAIR  1982   HERNIA REPAIR  2003   and bladder repair   SHOULDER SURGERY Right 04/2017   Tendon repair     OB History     Gravida  2   Para  2   Term       Preterm      AB      Living  2      SAB      IAB      Ectopic      Multiple      Live Births               Home Medications    Prior to Admission medications   Medication Sig Start Date End Date Taking? Authorizing Provider  ondansetron (ZOFRAN-ODT) 4 MG disintegrating tablet Take 1 tablet (4 mg total) by mouth every 8 (eight) hours as needed for nausea or vomiting. 04/07/21  Yes Particia Nearing, PA-C  oseltamivir (TAMIFLU) 75 MG capsule Take 1 capsule (75 mg total) by mouth every 12 (twelve) hours. 04/07/21  Yes Particia Nearing, PA-C  promethazine-dextromethorphan (PROMETHAZINE-DM) 6.25-15 MG/5ML syrup Take 5 mLs by mouth 4 (four) times daily as needed. 04/07/21  Yes Particia Nearing, PA-C  acyclovir (ZOVIRAX) 400 MG tablet Take 1 tablet (400 mg total) by mouth 3 (three) times daily. 06/03/20   Salley Scarlet, MD  ALPRAZolam Prudy Feeler) 0.5 MG tablet Take 0.5 tablets (0.25 mg total) by mouth at bedtime as needed for sleep. 03/31/21   Valentino Nose, NP  aspirin EC 325 MG tablet Take  1 tablet (325 mg total) by mouth daily. Patient taking differently: Take 325 mg by mouth every other day. 10/19/18   Salley Scarlet, MD  benazepril (LOTENSIN) 40 MG tablet Take 1 tablet (40 mg total) by mouth daily. 03/31/21   Valentino Nose, NP  CALCIUM PO Take by mouth daily.    [provider]  Cholecalciferol (VITAMIN D3) 125 MCG (5000 UT) CAPS 1 capsule daily 03/21/19   [place], Velna Hatchet, MD  citalopram (CELEXA) 40 MG tablet Take 1 tablet (40 mg total) by mouth daily. 03/31/21   Valentino Nose, NP  Coenzyme Q10 (COQ-10 PO) Take by mouth.    [provider]  estradiol (ESTRACE) 0.5 MG tablet Take 1 tablet (0.5 mg total) by mouth daily. 03/31/21   Jerene Bears, MD  KRILL OIL PO Take by mouth. Omega red krill oil    [provider]  levocetirizine (XYZAL) 5 MG tablet Take 1 tablet (5 mg total) by mouth every evening. 09/11/20    Valentino Nose, NP  meloxicam (MOBIC) 15 MG tablet Take 1 tablet (15 mg total) by mouth daily. 08/17/20 08/17/21  Vivi Barrack, DPM  Red Yeast Rice Extract (RED YEAST RICE PO) Take by mouth daily.    [provider]  vitamin C (ASCORBIC ACID) 500 MG tablet Take 1,000 mg by mouth 3 (three) times daily.    [provider]    Family History Family History  Problem Relation Age of Onset   Skin cancer Other    Lung cancer Father    Spina bifida Daughter        and hydrocephalia   Diabetes Mother    Hypertension Mother    Thyroid nodules Mother        being watched   Diabetes Sister        x 3   Hypertension Sister    Thyroid nodules Sister     Social History Social History   Tobacco Use   Smoking status: Never   Smokeless tobacco: Never  Vaping Use   Vaping Use: Never used  Substance Use Topics   Alcohol use: Yes    Alcohol/week: 2.0 - 3.0 standard drinks    Types: 2 - 3 Glasses of wine per week    Comment: Occassionally   Drug use: No     Allergies   Betadine [povidone iodine], Codeine, and Other   Review of Systems Review of Systems Per HPI  Physical Exam Triage Vital Signs ED Triage Vitals  Enc Vitals Group     BP 04/07/21 1301 138/89     Pulse Rate 04/07/21 1301 80     Resp 04/07/21 1301 18     Temp 04/07/21 1301 98.4 F (36.9 C)     Temp Source 04/07/21 1301 Oral     SpO2 04/07/21 1301 97 %     Weight --      Height --      Head Circumference --      Peak Flow --      Pain Score 04/07/21 1258 10     Pain Loc --      Pain Edu? --      Excl. in GC? --    No data found.  Updated Vital Signs BP 138/89 (BP Location: Right Arm)   Pulse 80   Temp 98.4 F (36.9 C) (Oral)   Resp 18   LMP 04/27/1992   SpO2 97%   Visual Acuity Right Eye Distance:  Left Eye Distance:   Bilateral Distance:    Right Eye Near:   Left Eye Near:    Bilateral Near:     Physical Exam Vitals and nursing note reviewed.  Constitutional:       Appearance: Normal appearance.  HENT:     Head: Atraumatic.     Right Ear: Tympanic membrane and external ear normal.     Left Ear: Tympanic membrane and external ear normal.     Nose: Rhinorrhea present.     Mouth/Throat:     Mouth: Mucous membranes are moist.     Pharynx: Posterior oropharyngeal erythema present.  Eyes:     Extraocular Movements: Extraocular movements intact.     Conjunctiva/sclera: Conjunctivae normal.  Cardiovascular:     Rate and Rhythm: Normal rate and regular rhythm.     Heart sounds: Normal heart sounds.  Pulmonary:     Effort: Pulmonary effort is normal.     Breath sounds: Normal breath sounds. No wheezing or rales.  Abdominal:     General: Bowel sounds are normal. There is no distension.     Palpations: Abdomen is soft.     Tenderness: There is no abdominal tenderness. There is no guarding.  Musculoskeletal:        General: Normal range of motion.     Cervical back: Normal range of motion and neck supple.  Skin:    General: Skin is warm and dry.  Neurological:     Mental Status: She is alert and oriented to person, place, and time.  Psychiatric:        Mood and Affect: Mood normal.        Thought Content: Thought content normal.     UC Treatments / Results  Labs (all labs ordered are listed, but only abnormal results are displayed) Labs Reviewed  COVID-19, FLU A+B NAA    EKG   Radiology No results found.  Procedures Procedures (including critical care time)  Medications Ordered in UC Medications  ondansetron (ZOFRAN-ODT) disintegrating tablet 4 mg (4 mg Oral Given 04/07/21 1336)    Initial Impression / Assessment and Plan / UC Course  I have reviewed the triage vital signs and the nursing notes.  Pertinent labs & imaging results that were available during my care of the patient were reviewed by me and considered in my medical decision making (see chart for details).     Vital signs benign and reassuring, given exposures  and symptoms suspect influenza.  We will start Tamiflu, Zofran, Phenergan DM and await COVID and flu results for confirmation.  Discussed supportive over-the-counter medications and home care.  Return for acutely worsening symptoms.  Final Clinical Impressions(s) / UC Diagnoses   Final diagnoses:  Exposure to the flu  Viral URI with cough  Nausea and vomiting, unspecified vomiting type   Discharge Instructions   None    ED Prescriptions     Medication Sig Dispense Auth. Provider   ondansetron (ZOFRAN-ODT) 4 MG disintegrating tablet Take 1 tablet (4 mg total) by mouth every 8 (eight) hours as needed for nausea or vomiting. 20 tablet Particia Nearing, [place]   oseltamivir (TAMIFLU) 75 MG capsule Take 1 capsule (75 mg total) by mouth every 12 (twelve) hours. 10 capsule Particia Nearing, [place]   promethazine-dextromethorphan (PROMETHAZINE-DM) 6.25-15 MG/5ML syrup Take 5 mLs by mouth 4 (four) times daily as needed. 100 mL Particia Nearing, [place]      PDMP not reviewed this encounter.   Particia Nearing,  PA-C 04/07/21 1511

## 2021-04-07 NOTE — ED Triage Notes (Signed)
Patient states that she thinks she has the Flu.  Headache for 2 days with coughing and vomiting. Body aches.  She states that her granddaughter was diagnosed with the Flu on Saturday.   Denies Fever.

## 2021-04-08 LAB — COVID-19, FLU A+B NAA
Influenza A, NAA: DETECTED — AB
Influenza B, NAA: NOT DETECTED
SARS-CoV-2, NAA: NOT DETECTED

## 2021-04-09 ENCOUNTER — Telehealth: Payer: Self-pay

## 2021-04-09 NOTE — Telephone Encounter (Signed)
Pt called in stating that she tested positive for the flu and now daughter is showing symptoms of flu. Pt would like to know if she could get a prescription for tamiflu sent to pharmacy. Please advise.  Cb#: [phone]

## 2021-04-09 NOTE — Telephone Encounter (Signed)
Looks like UC sent in Tamiflu on Monday for this patient

## 2021-04-09 NOTE — Telephone Encounter (Signed)
Spoke with patient.

## 2021-04-10 ENCOUNTER — Ambulatory Visit: Payer: 59 | Admitting: Nurse Practitioner

## 2021-04-14 ENCOUNTER — Other Ambulatory Visit: Payer: Self-pay

## 2021-04-14 ENCOUNTER — Ambulatory Visit
Admission: EM | Admit: 2021-04-14 | Discharge: 2021-04-14 | Disposition: A | Discharge status: Home / Self Care | Payer: 59 | Attending: Urgent Care | Admitting: Urgent Care

## 2021-04-14 DIAGNOSIS — J018 Other acute sinusitis: Secondary | ICD-10-CM

## 2021-04-14 DIAGNOSIS — J3489 Other specified disorders of nose and nasal sinuses: Secondary | ICD-10-CM

## 2021-04-14 DIAGNOSIS — R052 Subacute cough: Secondary | ICD-10-CM

## 2021-04-14 MED ORDER — PREDNISONE 20 MG PO TABS: ORAL_TABLET | ORAL | 0 refills | Status: DC

## 2021-04-14 MED ORDER — PROMETHAZINE-DM 6.25-15 MG/5ML PO SYRP: 5.0000 mL | ORAL_SOLUTION | Freq: Three times a day (TID) | ORAL | 0 refills | Status: DC | PRN

## 2021-04-14 MED ORDER — AMOXICILLIN 875 MG PO TABS: 875.0000 mg | ORAL_TABLET | Freq: Two times a day (BID) | ORAL | 0 refills | Status: DC

## 2021-04-14 MED ORDER — CETIRIZINE HCL 10 MG PO TABS: 10.0000 mg | ORAL_TABLET | Freq: Every day | ORAL | 0 refills | Status: DC

## 2021-04-14 NOTE — ED Triage Notes (Signed)
Pt reports cough x 1 week; headache and sinus pressure x 3 days. Repost she had flu 1 week ago. Muicnex gives some relief.

## 2021-04-14 NOTE — ED Provider Notes (Signed)
[facility]   MRN: [id number] DOB: 07/26/1955  Subjective:   [name] is a 65 y.o. female presenting for 1 week history of persistent and worsening sinus pressure, sinus headaches, sinus pain, bilateral ear pain, coughing.  Reports that her flulike symptoms are improved but now the cough is worsening and sore her sinus symptoms.  Has been using Mucinex without relief.  Has a history of allergic rhinitis, is not taking her levocetirizine.  No chest pain, shortness of breath or wheezing.  Patient is not a smoker.  No history of asthma.  No current facility-administered medications for this encounter.  Current Outpatient Medications:    acyclovir (ZOVIRAX) 400 MG tablet, Take 1 tablet (400 mg total) by mouth 3 (three) times daily., Disp: 30 tablet, Rfl: 1   ALPRAZolam (XANAX) 0.5 MG tablet, Take 0.5 tablets (0.25 mg total) by mouth at bedtime as needed for sleep., Disp: 60 tablet, Rfl: 0   aspirin EC 325 MG tablet, Take 1 tablet (325 mg total) by mouth daily. (Patient taking differently: Take 325 mg by mouth every other day.), Disp: 30 tablet, Rfl: 0   benazepril (LOTENSIN) 40 MG tablet, Take 1 tablet (40 mg total) by mouth daily., Disp: 90 tablet, Rfl: 1   CALCIUM PO, Take by mouth daily., Disp: , Rfl:    Cholecalciferol (VITAMIN D3) 125 MCG (5000 UT) CAPS, 1 capsule daily, Disp: 30 capsule, Rfl: 0   citalopram (CELEXA) 40 MG tablet, Take 1 tablet (40 mg total) by mouth daily., Disp: 90 tablet, Rfl: 1   Coenzyme Q10 (COQ-10 PO), Take by mouth., Disp: , Rfl:    estradiol (ESTRACE) 0.5 MG tablet, Take 1 tablet (0.5 mg total) by mouth daily., Disp: 90 tablet, Rfl: 4   KRILL OIL PO, Take by mouth. Omega red krill oil, Disp: , Rfl:    levocetirizine (XYZAL) 5 MG tablet, Take 1 tablet (5 mg total) by mouth every evening., Disp: 90 tablet, Rfl: 1   meloxicam (MOBIC) 15 MG tablet, Take 1 tablet (15 mg total) by mouth daily., Disp: 30 tablet, Rfl: 0   ondansetron (ZOFRAN-ODT) 4 MG  disintegrating tablet, Take 1 tablet (4 mg total) by mouth every 8 (eight) hours as needed for nausea or vomiting., Disp: 20 tablet, Rfl: 0   oseltamivir (TAMIFLU) 75 MG capsule, Take 1 capsule (75 mg total) by mouth every 12 (twelve) hours., Disp: 10 capsule, Rfl: 0   promethazine-dextromethorphan (PROMETHAZINE-DM) 6.25-15 MG/5ML syrup, Take 5 mLs by mouth 4 (four) times daily as needed., Disp: 100 mL, Rfl: 0   Red Yeast Rice Extract (RED YEAST RICE PO), Take by mouth daily., Disp: , Rfl:    vitamin C (ASCORBIC ACID) 500 MG tablet, Take 1,000 mg by mouth 3 (three) times daily., Disp: , Rfl:    Allergies  Allergen Reactions   Betadine [Povidone Iodine] Itching   Codeine    Other     Band aids-itchy, red rash    Past Medical History:  Diagnosis Date   Broken ankle 1/16   will have surgery on 01/30/15-right ankle   Elevated hemoglobin A1c 01/17/15   6.0   Endometriosis    Herpes simplex type 1 infection    Hypertension    Migraine    h/o migraines, none since hysterectomy   Situational depression    Varicose vein    surgery on right leg     Past Surgical History:  Procedure Laterality Date   ABDOMINAL HYSTERECTOMY  1994   LAVH/RSO  ANKLE SURGERY Right 01/30/2015   BREAST BIOPSY Left    CESAREAN SECTION     CHOLECYSTECTOMY     COLON RESECTION  11/06   and lap LSO   CYSTECTOMY     HERNIA REPAIR  1982   HERNIA REPAIR  2003   and bladder repair   SHOULDER SURGERY Right 04/2017   Tendon repair     Family History  Problem Relation Age of Onset   Skin cancer Other    Lung cancer Father    Spina bifida Daughter        and hydrocephalia   Diabetes Mother    Hypertension Mother    Thyroid nodules Mother        being watched   Diabetes Sister        x 3   Hypertension Sister    Thyroid nodules Sister     Social History   Tobacco Use   Smoking status: Never   Smokeless tobacco: Never  Vaping Use   Vaping Use: Never used  Substance Use Topics   Alcohol use: Yes     Alcohol/week: 2.0 - 3.0 standard drinks    Types: 2 - 3 Glasses of wine per week    Comment: Occassionally   Drug use: No    ROS   Objective:   Vitals: BP (!) 153/98 (BP Location: Right Arm)    Pulse 79    Temp 98 F (36.7 C) (Oral)    Resp 18    LMP 04/27/1992    SpO2 98%   Physical Exam Constitutional:      General: She is not in acute distress.    Appearance: Normal appearance. She is well-developed. She is not ill-appearing, toxic-appearing or diaphoretic.  HENT:     Head: Normocephalic and atraumatic.     Right Ear: Tympanic membrane, ear canal and external ear normal. No drainage or tenderness. No middle ear effusion. Tympanic membrane is not erythematous.     Left Ear: Tympanic membrane, ear canal and external ear normal. No drainage or tenderness.  No middle ear effusion. Tympanic membrane is not erythematous.     Nose: Congestion present. No rhinorrhea.     Mouth/Throat:     Mouth: Mucous membranes are moist. No oral lesions.     Pharynx: No pharyngeal swelling, oropharyngeal exudate, posterior oropharyngeal erythema or uvula swelling.     Tonsils: No tonsillar exudate or tonsillar abscesses.     Comments: Significant postnasal drainage overlying pharynx. Eyes:     General: No scleral icterus.       Right eye: No discharge.        Left eye: No discharge.     Extraocular Movements: Extraocular movements intact.     Right eye: Normal extraocular motion.     Left eye: Normal extraocular motion.     Conjunctiva/sclera: Conjunctivae normal.     Pupils: Pupils are equal, round, and reactive to light.  Cardiovascular:     Rate and Rhythm: Normal rate and regular rhythm.     Pulses: Normal pulses.     Heart sounds: Normal heart sounds. No murmur heard.   No friction rub. No gallop.  Pulmonary:     Effort: Pulmonary effort is normal. No respiratory distress.     Breath sounds: Normal breath sounds. No stridor. No wheezing, rhonchi or rales.  Musculoskeletal:      Cervical back: Normal range of motion and neck supple.  Lymphadenopathy:     Cervical: No cervical adenopathy.  Skin:    General: Skin is warm and dry.     Findings: No rash.  Neurological:     General: No focal deficit present.     Mental Status: She is alert and oriented to person, place, and time.     Cranial Nerves: No cranial nerve deficit, dysarthria or facial asymmetry.     Motor: No weakness.     Coordination: Coordination normal.     Gait: Gait normal.  Psychiatric:        Mood and Affect: Mood normal.        Behavior: Behavior normal.        Thought Content: Thought content normal.        Judgment: Judgment normal.    Assessment and Plan :   PDMP not reviewed this encounter.  1. Acute non-recurrent sinusitis of other sinus   2. Sinus pain   3. Subacute cough    Will start empiric treatment for sinusitis with amoxicillin.  Given her history of allergic rhinitis and worsening nature of her cough, will also be using a prednisone course.  Recommended supportive care otherwise including the use of oral antihistamine. Deferred imaging given clear cardiopulmonary exam, hemodynamically stable vital signs.  Deferred testing for the respiratory panel as she had already done this on the 12th and was positive for flu A.  Counseled patient on potential for adverse effects with medications prescribed/recommended today, ER and return-to-clinic precautions discussed, patient verbalized understanding.    Wallis Bamberg, [place] 04/14/21 [phone]

## 2021-04-25 ENCOUNTER — Other Ambulatory Visit: Payer: Self-pay

## 2021-04-25 ENCOUNTER — Telehealth: Payer: Self-pay

## 2021-04-25 ENCOUNTER — Ambulatory Visit: Payer: 59 | Admitting: Nurse Practitioner

## 2021-04-25 ENCOUNTER — Encounter: Payer: Self-pay | Admitting: Nurse Practitioner

## 2021-04-25 VITALS — BP 138/82 | HR 66 | Ht 64.25 in | Wt 163.0 lb

## 2021-04-25 DIAGNOSIS — R300 Dysuria: Secondary | ICD-10-CM | POA: Diagnosis not present

## 2021-04-25 LAB — URINALYSIS, ROUTINE W REFLEX MICROSCOPIC
Bacteria, UA: NONE SEEN /HPF
Bilirubin Urine: NEGATIVE
Glucose, UA: NEGATIVE
Hyaline Cast: NONE SEEN /LPF
Ketones, ur: NEGATIVE
Nitrite: NEGATIVE
Protein, ur: NEGATIVE
Specific Gravity, Urine: 1.002 (ref 1.001–1.035)
pH: 5.5 (ref 5.0–8.0)

## 2021-04-25 LAB — MICROSCOPIC MESSAGE

## 2021-04-25 MED ORDER — CEPHALEXIN 500 MG PO CAPS: 500.0000 mg | ORAL_CAPSULE | Freq: Four times a day (QID) | ORAL | 0 refills | Status: DC

## 2021-04-25 MED ORDER — PHENAZOPYRIDINE HCL 100 MG PO TABS: 100.0000 mg | ORAL_TABLET | Freq: Three times a day (TID) | ORAL | 0 refills | Status: DC | PRN

## 2021-04-25 NOTE — Telephone Encounter (Signed)
Patient is coming in at 11:15 to see Shanda Bumps.

## 2021-04-25 NOTE — Telephone Encounter (Signed)
Pt called  Has not been drinking water very much - she is caring for her mom in critical care out of town   Has to go out of town again on [employer]  S/S currently:  [place], uncomfortable  Urgency, and frequent urination    Pharmacy [facility]  [address].

## 2021-04-25 NOTE — Progress Notes (Signed)
Subjective:    Patient ID: [name], female    DOB: 1956/03/13, 65 y.o.   MRN: [id number]  HPI: [name] is a 65 y.o. female presenting for urinary frequency, urgency, and burning with urination.  Chief Complaint  Patient presents with   Dysuria   Urinary Frequency   URINARY SYMPTOMS Patient reports she was treated ~10 days ago with amoxicillin and prednisone for sinus infection.  Tamiflu gave her terrible diarrhea.  She reports urinary symptoms started 1 day ago.  Duration: 1 day Dysuria: yes Urinary frequency: yes Urgency: yes Small volume voids: yes Symptom severity: severe Urinary incontinence:  starts dribbling early Foul odor:  yes; stronger  Hematuria: no Abdominal pain: no Back pain: no Suprapubic pain/pressure: yes Flank pain: no Fever:  no Nausea: no Vomiting: no Relief with cranberry juice: no Relief with pyridium: no Status: worse Previous urinary tract infection: yes; years ago Recurrent urinary tract infection: no Sexual activity: No sexually active History of sexually transmitted disease: no Vaginal discharge: no Treatments attempted: nothing tried   Allergies  Allergen Reactions   Betadine [Povidone Iodine] Itching   Codeine    Other     Band aids-itchy, red rash    Outpatient Encounter Medications as of 04/25/2021  Medication Sig   aspirin 325 MG tablet Take 325 mg by mouth every other day.   cephALEXin (KEFLEX) 500 MG capsule Take 1 capsule (500 mg total) by mouth every 6 (six) hours.   phenazopyridine (PYRIDIUM) 100 MG tablet Take 1 tablet (100 mg total) by mouth 3 (three) times daily as needed for pain.   acyclovir (ZOVIRAX) 400 MG tablet Take 1 tablet (400 mg total) by mouth 3 (three) times daily.   ALPRAZolam (XANAX) 0.5 MG tablet Take 0.5 tablets (0.25 mg total) by mouth at bedtime as needed for sleep.   benazepril (LOTENSIN) 40 MG tablet Take 1 tablet (40 mg total) by mouth daily.   CALCIUM PO Take by mouth daily.    cetirizine (ZYRTEC ALLERGY) 10 MG tablet Take 1 tablet (10 mg total) by mouth daily.   Cholecalciferol (VITAMIN D3) 125 MCG (5000 UT) CAPS 1 capsule daily   citalopram (CELEXA) 40 MG tablet Take 1 tablet (40 mg total) by mouth daily.   Coenzyme Q10 (COQ-10 PO) Take by mouth.   estradiol (ESTRACE) 0.5 MG tablet Take 1 tablet (0.5 mg total) by mouth daily.   KRILL OIL PO Take by mouth. Omega red krill oil   levocetirizine (XYZAL) 5 MG tablet Take 1 tablet (5 mg total) by mouth every evening.   meloxicam (MOBIC) 15 MG tablet Take 1 tablet (15 mg total) by mouth daily.   ondansetron (ZOFRAN-ODT) 4 MG disintegrating tablet Take 1 tablet (4 mg total) by mouth every 8 (eight) hours as needed for nausea or vomiting.   promethazine-dextromethorphan (PROMETHAZINE-DM) 6.25-15 MG/5ML syrup Take 5 mLs by mouth 3 (three) times daily as needed for cough.   Red Yeast Rice Extract (RED YEAST RICE PO) Take by mouth daily.   vitamin C (ASCORBIC ACID) 500 MG tablet Take 1,000 mg by mouth 3 (three) times daily.   [DISCONTINUED] amoxicillin (AMOXIL) 875 MG tablet Take 1 tablet (875 mg total) by mouth 2 (two) times daily.   [DISCONTINUED] aspirin EC 325 MG tablet Take 1 tablet (325 mg total) by mouth daily. (Patient taking differently: Take 325 mg by mouth every other day.)   [DISCONTINUED] oseltamivir (TAMIFLU) 75 MG capsule Take 1 capsule (75 mg total) by mouth  every 12 (twelve) hours.   [DISCONTINUED] predniSONE (DELTASONE) 20 MG tablet Take 2 tablets daily with breakfast.   No facility-administered encounter medications on file as of 04/25/2021.    Patient Active Problem List   Diagnosis Date Noted   Situational anxiety 01/17/2021   Prediabetes 12/05/2020   Controlled substance agreement signed 12/05/2020   Situational depression 12/04/2020   Insomnia 06/03/2020   Hyperlipidemia 03/22/2019   GERD (gastroesophageal reflux disease) 04/11/2018   Allergic rhinitis 12/09/2016   Ankle instability 03/07/2015    Vitamin D deficiency 12/04/2013   Herpes simplex type 1 infection    Hypertension    S/P right inguinal hernia repair-Davol 3D max mesh 2003 08/05/2012   S/P arthroscopy of right shoulder 08/05/2012    Past Medical History:  Diagnosis Date   Broken ankle 1/16   will have surgery on 01/30/15-right ankle   Elevated hemoglobin A1c 01/17/15   6.0   Endometriosis    Herpes simplex type 1 infection    Hypertension    Migraine    h/o migraines, none since hysterectomy   Situational depression    Varicose vein    surgery on right leg    Relevant past medical, surgical, family and social history reviewed and updated as indicated. Interim medical history since our last visit reviewed.  Review of Systems Per HPI unless specifically indicated above     Objective:    BP 138/82    Pulse 66    Ht 5' 4.25" (1.632 m)    Wt 163 lb (73.9 kg)    LMP 04/27/1992    SpO2 98%    BMI 27.76 kg/m   Wt Readings from Last 3 Encounters:  04/25/21 163 lb (73.9 kg)  03/31/21 167 lb 3.2 oz (75.8 kg)  03/31/21 166 lb 12.8 oz (75.7 kg)    Physical Exam Vitals and nursing note reviewed.  Constitutional:      General: She is not in acute distress.    Appearance: Normal appearance. She is not toxic-appearing.  HENT:     Head: Atraumatic.  Abdominal:     General: Abdomen is flat. Bowel sounds are normal. There is no distension.     Palpations: Abdomen is soft. There is no mass.     Tenderness: There is no abdominal tenderness. There is no right CVA tenderness or left CVA tenderness.  Skin:    General: Skin is warm and dry.     Capillary Refill: Capillary refill takes less than 2 seconds.     Coloration: Skin is not jaundiced or pale.     Findings: No erythema.  Neurological:     Mental Status: She is alert and oriented to person, place, and time.     Motor: No weakness.     Gait: Gait normal.  Psychiatric:        Mood and Affect: Mood normal.        Behavior: Behavior normal.        Thought  Content: Thought content normal.        Judgment: Judgment normal.      Assessment & Plan:  1. Dysuria Acute.  UA today shows 3+ blood, leukocyte esterase, 0-5 WBC, 0-2 RBC.  Will treat empirically for UTI with Keflex 500 mg every 6 hours as needed while awaiting culture results.   Start Pyridium 100 mg up to three times daily as needed for burning.  No red flags in history or on examination.  Follow up if symptoms do not improve with  treatment.  If symptoms worsen or with any nausea/vomiting and unable to keep fluids down, go to ER.  - Urinalysis, Routine w reflex microscopic - Urine Culture - cephALEXin (KEFLEX) 500 MG capsule; Take 1 capsule (500 mg total) by mouth every 6 (six) hours.  Dispense: 20 capsule; Refill: 0 - phenazopyridine (PYRIDIUM) 100 MG tablet; Take 1 tablet (100 mg total) by mouth 3 (three) times daily as needed for pain.  Dispense: 10 tablet; Refill: 0   Follow up plan: Return if symptoms worsen or fail to improve.

## 2021-04-25 NOTE — Telephone Encounter (Signed)
Pt called in stating that she just recently got over the flu, but feels that the antibiotic she was prescribed has caused a UTI. Pt wanted to know if she needed to come in for an OV, or the NP would send something to the pharmacy for this. Please advise.   Cb#: [phone]

## 2021-04-25 NOTE — Telephone Encounter (Signed)
Needs OV - in person preferred as antibiotics do not typically cause UTIs but can cause vaginitis.  If she is unable to come in for OV, please direct her to evisit in [place].

## 2021-04-27 LAB — URINE CULTURE
MICRO NUMBER:: 12813523
SPECIMEN QUALITY:: ADEQUATE

## 2021-05-19 ENCOUNTER — Other Ambulatory Visit: Payer: Self-pay

## 2021-05-19 ENCOUNTER — Encounter: Payer: Self-pay | Admitting: Nurse Practitioner

## 2021-05-19 ENCOUNTER — Ambulatory Visit: Payer: 59 | Admitting: Nurse Practitioner

## 2021-05-19 VITALS — BP 130/82 | Ht 64.25 in | Wt 167.0 lb

## 2021-05-19 DIAGNOSIS — F418 Other specified anxiety disorders: Secondary | ICD-10-CM

## 2021-05-19 DIAGNOSIS — I10 Essential (primary) hypertension: Secondary | ICD-10-CM | POA: Diagnosis not present

## 2021-05-19 NOTE — Progress Notes (Signed)
Subjective:    Patient ID: [name], female    DOB: 11/23/1955, 66 y.o.   MRN: [id number]  HPI: [name] is a 66 y.o. female presenting for follow up.  Chief Complaint  Patient presents with   Hypertension   Anxiety   Patient is requesting refills of her medication today.    Our last visit was about 6 weeks ago and she received refills then, but did not realize this.   She is currently taking benazepril 40 mg daily and citalopram 40 mg daily.  She is tolerating both of these medications well.  She reports she mood is under the best control it has been and she feels very stable.  She is no longer having panic attacks.  She attributes this to the citalopram.  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  She and her sister take turns being the 24/7 caregiver.  She has come to terms with her mother being near the end of her life and is interested in possibly coming off of the citalopram later on.    She is very pleased with her blood pressure today and denies headaches, vision changes, shortness of breath, and chest pain.  Allergies  Allergen Reactions   Betadine [Povidone Iodine] Itching   Codeine    Other     Band aids-itchy, red rash    Outpatient Encounter Medications as of 05/19/2021  Medication Sig   acyclovir (ZOVIRAX) 400 MG tablet Take 1 tablet (400 mg total) by mouth 3 (three) times daily.   ALPRAZolam (XANAX) 0.5 MG tablet Take 0.5 tablets (0.25 mg total) by mouth at bedtime as needed for sleep.   aspirin 325 MG tablet Take 325 mg by mouth every other day.   benazepril (LOTENSIN) 40 MG tablet Take 1 tablet (40 mg total) by mouth daily.   CALCIUM PO Take by mouth daily.   cetirizine (ZYRTEC ALLERGY) 10 MG tablet Take 1 tablet (10 mg total) by mouth daily.   Cholecalciferol (VITAMIN D3) 125 MCG (5000 UT) CAPS 1 capsule daily   citalopram (CELEXA) 40 MG tablet Take 1 tablet (40 mg total) by mouth daily.   Coenzyme Q10  (COQ-10 PO) Take by mouth.   estradiol (ESTRACE) 0.5 MG tablet Take 1 tablet (0.5 mg total) by mouth daily.   KRILL OIL PO Take by mouth. Omega red krill oil   levocetirizine (XYZAL) 5 MG tablet Take 1 tablet (5 mg total) by mouth every evening.   meloxicam (MOBIC) 15 MG tablet Take 1 tablet (15 mg total) by mouth daily.   ondansetron (ZOFRAN-ODT) 4 MG disintegrating tablet Take 1 tablet (4 mg total) by mouth every 8 (eight) hours as needed for nausea or vomiting.   phenazopyridine (PYRIDIUM) 100 MG tablet Take 1 tablet (100 mg total) by mouth 3 (three) times daily as needed for pain.   Red Yeast Rice Extract (RED YEAST RICE PO) Take by mouth daily.   vitamin C (ASCORBIC ACID) 500 MG tablet Take 1,000 mg by mouth 3 (three) times daily.   [DISCONTINUED] cephALEXin (KEFLEX) 500 MG capsule Take 1 capsule (500 mg total) by mouth every 6 (six) hours.   [DISCONTINUED] promethazine-dextromethorphan (PROMETHAZINE-DM) 6.25-15 MG/5ML syrup Take 5 mLs by mouth 3 (three) times daily as needed for cough.   No facility-administered encounter medications on file as of 05/19/2021.    Patient Active Problem List   Diagnosis Date Noted   Situational anxiety 01/17/2021  Prediabetes 12/05/2020   Controlled substance agreement signed 12/05/2020   Situational depression 12/04/2020   Insomnia 06/03/2020   Hyperlipidemia 03/22/2019   GERD (gastroesophageal reflux disease) 04/11/2018   Allergic rhinitis 12/09/2016   Ankle instability 03/07/2015   Vitamin D deficiency 12/04/2013   Herpes simplex type 1 infection    Hypertension    S/P right inguinal hernia repair-Davol 3D max mesh 2003 08/05/2012   S/P arthroscopy of right shoulder 08/05/2012    Past Medical History:  Diagnosis Date   Broken ankle 1/16   will have surgery on 01/30/15-right ankle   Elevated hemoglobin A1c 01/17/15   6.0   Endometriosis    Herpes simplex type 1 infection    Hypertension    Migraine    h/o migraines, none since  hysterectomy   Situational depression    Varicose vein    surgery on right leg    Relevant past medical, surgical, family and social history reviewed and updated as indicated. Interim medical history since our last visit reviewed.  Review of Systems Per HPI unless specifically indicated above     Objective:    BP 130/82    Ht 5' 4.25" (1.632 m)    Wt 167 lb (75.8 kg)    LMP 04/27/1992    BMI 28.44 kg/m   Wt Readings from Last 3 Encounters:  05/19/21 167 lb (75.8 kg)  04/25/21 163 lb (73.9 kg)  03/31/21 167 lb 3.2 oz (75.8 kg)    Physical Exam Vitals and nursing note reviewed.  Constitutional:      General: She is not in acute distress.    Appearance: Normal appearance. She is not toxic-appearing.  Cardiovascular:     Rate and Rhythm: Normal rate and regular rhythm.     Heart sounds: Normal heart sounds. No murmur heard. Pulmonary:     Effort: Pulmonary effort is normal. No respiratory distress.     Breath sounds: Normal breath sounds. No stridor. No wheezing, rhonchi or rales.  Skin:    General: Skin is warm and dry.     Coloration: Skin is not jaundiced or pale.     Findings: No erythema.  Neurological:     Mental Status: She is alert and oriented to person, place, and time.     Motor: No weakness.     Gait: Gait normal.  Psychiatric:        Mood and Affect: Mood normal.        Behavior: Behavior normal.        Thought Content: Thought content normal.        Judgment: Judgment normal.      Assessment & Plan:   Problem List Items Addressed This Visit       Cardiovascular and Mediastinum   Hypertension    Chronic.  BP is well controlled. Recent kidney function and electrolyte look great.  Plan to continue benazepril, refills for 6 months have been ordered.         Other   Situational anxiety - Primary    Chronic.  PHQ-9 and GAD-7 are stable today.  Plan to continue citalopram 40 mg daily and very sparing use of alprazolam.  Refills have recently been sent in  for these medications.        Follow up plan: Return for with new PCP.

## 2021-05-19 NOTE — Assessment & Plan Note (Signed)
Chronic.  PHQ-9 and GAD-7 are stable today.  Plan to continue citalopram 40 mg daily and very sparing use of alprazolam.  Refills have recently been sent in for these medications.

## 2021-05-19 NOTE — Assessment & Plan Note (Signed)
Chronic.  BP is well controlled. Recent kidney function and electrolyte look great.  Plan to continue benazepril, refills for 6 months have been ordered.

## 2021-05-29 ENCOUNTER — Other Ambulatory Visit: Payer: Self-pay | Admitting: Podiatry

## 2021-06-14 ENCOUNTER — Other Ambulatory Visit: Payer: Self-pay | Admitting: Nurse Practitioner

## 2021-06-14 DIAGNOSIS — I10 Essential (primary) hypertension: Secondary | ICD-10-CM

## 2021-06-30 ENCOUNTER — Encounter (HOSPITAL_BASED_OUTPATIENT_CLINIC_OR_DEPARTMENT_OTHER): Payer: Self-pay | Admitting: Nurse Practitioner

## 2021-06-30 ENCOUNTER — Ambulatory Visit (HOSPITAL_BASED_OUTPATIENT_CLINIC_OR_DEPARTMENT_OTHER): Payer: 59 | Admitting: Nurse Practitioner

## 2021-06-30 ENCOUNTER — Other Ambulatory Visit: Payer: Self-pay

## 2021-06-30 VITALS — BP 117/72 | HR 83 | Ht 65.0 in | Wt 169.4 lb

## 2021-06-30 DIAGNOSIS — Z0000 Encounter for general adult medical examination without abnormal findings: Secondary | ICD-10-CM | POA: Diagnosis not present

## 2021-06-30 DIAGNOSIS — J302 Other seasonal allergic rhinitis: Secondary | ICD-10-CM

## 2021-06-30 DIAGNOSIS — F5102 Adjustment insomnia: Secondary | ICD-10-CM | POA: Diagnosis not present

## 2021-06-30 DIAGNOSIS — F4321 Adjustment disorder with depressed mood: Secondary | ICD-10-CM

## 2021-06-30 DIAGNOSIS — F418 Other specified anxiety disorders: Secondary | ICD-10-CM

## 2021-06-30 MED ORDER — ALPRAZOLAM 0.5 MG PO TABS: 0.2500 mg | ORAL_TABLET | Freq: Every evening | ORAL | 0 refills | Status: DC | PRN

## 2021-06-30 MED ORDER — CITALOPRAM HYDROBROMIDE 40 MG PO TABS: 40.0000 mg | ORAL_TABLET | Freq: Every day | ORAL | 1 refills | Status: DC

## 2021-06-30 MED ORDER — LEVOCETIRIZINE DIHYDROCHLORIDE 5 MG PO TABS: 5.0000 mg | ORAL_TABLET | Freq: Every evening | ORAL | 1 refills | Status: DC

## 2021-06-30 NOTE — Assessment & Plan Note (Signed)
Chronic. PHQ and GAD elevated, but stable.  ?Patient endorses she feels ok at this time. Plan to continue medication for now with no changes.  ?When patient is ready, we can slowly taper and monitor symptoms for complete stop.  ?
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
## 2021-06-30 NOTE — Assessment & Plan Note (Signed)
Situational. Chronic. Currently taking 0.25mg  xanax only PRN. PDMP reviewed today.  ?OK to continue medication at current dose. Will plan to start taper once she feels that her stressors are reduced. Will consider transition to hydroxyzine for PRN insomnia with reduced anxiety.  ?

## 2021-06-30 NOTE — Assessment & Plan Note (Signed)
Chronic PHQ/GAD elevated but stable today.  ?Using xanax only PRN with no concerns for overuse or oversedation. No falls. ?Will plan to continue medications for now. Will plan to begin slow taper once patient feels she is ready. OK to refill PRN.  ?F/U in 3-6 months.  ?

## 2021-06-30 NOTE — Assessment & Plan Note (Signed)
Chronic. Well controlled with current regimen.  ?No changes to plan of care. OK to refill as needed.  ?

## 2021-06-30 NOTE — Patient Instructions (Signed)
Thank you for choosing [facility] for your Primary Care needs. I am excited for the opportunity to partner with you to meet your health care goals. It was a pleasure meeting you today!  Recommendations from today's visit: I have sent the refill for the alprazolam to the pharmacy for you.  Please let me know if you need any additional refills if the pharmacy is not able to send these automatically.    Information on diet, exercise, and health maintenance recommendations are listed below. This is information to help you be sure you are on track for optimal health and monitoring.   Please look over this and let us know if you have any questions or if you have completed any of the health maintenance outside of [facility] so that we can be sure your records are up to date.  ___________________________________________________________ About Me: I am an Adult-Geriatric Nurse Practitioner with a background in caring for patients for more than 20 years with a strong intensive care background. I provide primary care and sports medicine services to patients age 36 and older within this office. My education had a strong focus on caring for the older adult population, which I am passionate about. I am also the director of the APP Fellowship with [facility].   My desire is to provide you with the best service through preventive medicine and supportive care. I consider you a part of the medical team and value your input. I work diligently to ensure that you are heard and your needs are met in a safe and effective manner. I want you to feel comfortable with me as your provider and want you to know that your health concerns are important to me.  For your information, our office hours are: Monday, Tuesday, and Thursday 8:00 AM - 5:00 PM Wednesday and Friday 8:00 AM - 12:00 PM.   In my time away from the office I am teaching new APP's within the system and am unavailable, but my  partner, Dr. Burnard Bunting is in the office for emergent needs.   If you have questions or concerns, please call our office at [phone] or send [place] a MyChart message and we will respond as quickly as possible.  ____________________________________________________________ MyChart:  For all urgent or time sensitive needs we ask that you please call the office to avoid delays. Our number is [phone]. MyChart is not constantly monitored and due to the large volume of messages a day, replies may take up to 72 business hours.  MyChart Policy: MyChart allows for you to see your visit notes, after visit summary, provider recommendations, lab and tests results, make an appointment, request refills, and contact your provider or the office for non-urgent questions or concerns. Providers are seeing patients during normal business hours and do not have built in time to review MyChart messages.  We ask that you allow a minimum of 3 business days for responses to [employer]. For this reason, please do not send urgent requests through [facility]. Please call the office at [phone]. New and ongoing conditions may require a visit. We have virtual and in person visit available for your convenience.  Complex MyChart concerns may require a visit. Your provider may request you schedule a virtual or in person visit to ensure we are providing the best care possible. MyChart messages sent after 11:00 AM on Friday will not be received by the provider until Monday morning.    Lab and Test Results: You  will receive your lab and test results on MyChart as soon as they are completed and results have been sent by the lab or testing facility. Due to this service, you will receive your results BEFORE your provider.  I review lab and tests results each morning prior to seeing patients. Some results require collaboration with other providers to ensure you are receiving the most appropriate care. For this reason, we ask that  you please allow a minimum of 3-5 business days from the time the ALL results have been received for your provider to receive and review lab and test results and contact you about these.  Most lab and test result comments from the provider will be sent through [place]. Your provider may recommend changes to the plan of care, follow-up visits, repeat testing, ask questions, or request an office visit to discuss these results. You may reply directly to this message or call the office at [phone] to provide information for the provider or set up an appointment. In some instances, you will be called with test results and recommendations. Please let us know if this is preferred and we will make note of this in your chart to provide this for you.    If you have not heard a response to your lab or test results in 5 business days from all results returning to [place], please call the office to let us know. We ask that you please avoid calling prior to this time unless there is an emergent concern. Due to high call volumes, this can delay the resulting process.  After Hours: For all non-emergency after hours needs, please call the office at [phone] and select the option to reach the on-call provider service. On-call services are shared between multiple [facility] offices and therefore it will not be possible to speak directly with your provider. On-call providers may provide medical advice and recommendations, but are unable to provide refills for maintenance medications.  For all emergency or urgent medical needs after normal business hours, we recommend that you seek care at the closest Urgent Care or Emergency Department to ensure appropriate treatment in a timely manner.  [facility] has a 24 hour emergency room located on the ground floor for your convenience.   Urgent Concerns During the Business Day Providers are seeing patients from 8AM to [place] with a busy schedule and are  most often not able to respond to non-urgent calls until the end of the day or the next business day. If you should have URGENT concerns during the day, please call and speak to the nurse or schedule a same day appointment so that we can address your concern without delay.   Thank you, again, for choosing me as your health care partner. I appreciate your trust and look forward to learning more about you.   Amy Keeler, DNP, AGNP-c ___________________________________________________________  Health Maintenance Recommendations Screening Testing Mammogram Every 1 -2 years based on history and risk factors Starting at age 64 Pap Smear Ages 21-39 every 3 years Ages 85-65 every 5 years with HPV testing More frequent testing may be required based on results and history Colon Cancer Screening Every 1-10 years based on test performed, risk factors, and history Starting at age 69 Bone Density Screening Every 2-10 years based on history Starting at age 29 for women Recommendations for men differ based on medication usage, history, and risk factors AAA Screening One time ultrasound Men 82-55 years old who have every smoked Lung Cancer Screening  Low Dose Lung CT every 12 months Age 29-80 years with a 30 pack-year smoking history who still smoke or who have quit within the last 15 years  Screening Labs Routine  Labs: Complete Blood Count (CBC), Complete Metabolic Panel (CMP), Cholesterol (Lipid Panel) Every 6-12 months based on history and medications May be recommended more frequently based on current conditions or previous results Hemoglobin A1c Lab Every 3-12 months based on history and previous results Starting at age 57 or earlier with diagnosis of diabetes, high cholesterol, BMI >26, and/or risk factors Frequent monitoring for patients with diabetes to ensure blood sugar control Thyroid Panel (TSH w/ T3 & T4) Every 6 months based on history, symptoms, and risk factors May be  repeated more often if on medication HIV One time testing for all patients 71 and older May be repeated more frequently for patients with increased risk factors or exposure Hepatitis C One time testing for all patients 64 and older May be repeated more frequently for patients with increased risk factors or exposure Gonorrhea, Chlamydia Every 12 months for all sexually active persons 13-24 years Additional monitoring may be recommended for those who are considered high risk or who have symptoms PSA Men 64-54 years old with risk factors Additional screening may be recommended from age 25-69 based on risk factors, symptoms, and history  Vaccine Recommendations Tetanus Booster All adults every 10 years Flu Vaccine All patients 6 months and older every year COVID Vaccine All patients 12 years and older Initial dosing with booster May recommend additional booster based on age and health history HPV Vaccine 2 doses all patients age 33-26 Dosing may be considered for patients over 26 Shingles Vaccine (Shingrix) 2 doses all adults 8 years and older Pneumonia (Pneumovax 23) All adults 32 years and older May recommend earlier dosing based on health history Pneumonia (Prevnar 34) All adults 82 years and older Dosed 1 year after Pneumovax 23  Additional Screening, Testing, and Vaccinations may be recommended on an individualized basis based on family history, health history, risk factors, and/or exposure.  __________________________________________________________  Diet Recommendations for All Patients  I recommend that all patients maintain a diet low in saturated fats, carbohydrates, and cholesterol. While this can be challenging at first, it is not impossible and small changes can make big differences.  Things to try: Decreasing the amount of soda, sweet tea, and/or juice to one or less per day and replace with water While water is always the first choice, if you do not like water  you may consider adding a water additive without sugar to improve the taste other sugar free drinks Replace potatoes with a brightly colored vegetable at dinner Use healthy oils, such as canola oil or olive oil, instead of butter or hard margarine Limit your bread intake to two pieces or less a day Replace regular pasta with low carb pasta options Bake, broil, or grill foods instead of frying Monitor portion sizes  Eat smaller, more frequent meals throughout the day instead of large meals  An important thing to remember is, if you love foods that are not great for your health, you don't have to give them up completely. Instead, allow these foods to be a reward when you have done well. Allowing yourself to still have special treats every once in a while is a nice way to tell yourself thank you for working hard to keep yourself healthy.   Also remember that every day is a new day. If you have a bad  day and "fall off the wagon", you can still climb right back up and keep moving along on your journey!  We have resources available to help you!  Some websites that may be helpful include: www.http://carter.biz/  Www.VeryWellFit.com _____________________________________________________________  Activity Recommendations for All Patients  I recommend that all adults get at least 20 minutes of moderate physical activity that elevates your heart rate at least 5 days out of the week.  Some examples include: Walking or jogging at a pace that allows you to carry on a conversation Cycling (stationary bike or outdoors) Water aerobics Yoga Weight lifting Dancing If physical limitations prevent you from putting stress on your joints, exercise in a pool or seated in a chair are excellent options.  Do determine your MAXIMUM heart rate for activity: YOUR AGE - 220 = MAX HeartRate   Remember! Do not push yourself too hard.  Start slowly and build up your pace, speed, weight, time in exercise, etc.  Allow  your body to rest between exercise and get good sleep. You will need more water than normal when you are exerting yourself. Do not wait until you are thirsty to drink. Drink with a purpose of getting in at least 8, 8 ounce glasses of water a day plus more depending on how much you exercise and sweat.    If you begin to develop dizziness, chest pain, abdominal pain, jaw pain, shortness of breath, headache, vision changes, lightheadedness, or other concerning symptoms, stop the activity and allow your body to rest. If your symptoms are severe, seek emergency evaluation immediately. If your symptoms are concerning, but not severe, please let us know so that we can recommend further evaluation.

## 2021-08-20 ENCOUNTER — Other Ambulatory Visit (HOSPITAL_BASED_OUTPATIENT_CLINIC_OR_DEPARTMENT_OTHER): Payer: Self-pay | Admitting: Nurse Practitioner

## 2021-08-20 DIAGNOSIS — F5102 Adjustment insomnia: Secondary | ICD-10-CM

## 2021-11-17 ENCOUNTER — Other Ambulatory Visit: Payer: Self-pay | Admitting: Nurse Practitioner

## 2021-11-17 DIAGNOSIS — I10 Essential (primary) hypertension: Secondary | ICD-10-CM

## 2021-11-24 ENCOUNTER — Ambulatory Visit (INDEPENDENT_AMBULATORY_CARE_PROVIDER_SITE_OTHER): Payer: PPO | Admitting: Family Medicine

## 2021-11-24 ENCOUNTER — Encounter (HOSPITAL_BASED_OUTPATIENT_CLINIC_OR_DEPARTMENT_OTHER): Payer: Self-pay | Admitting: Family Medicine

## 2021-11-24 DIAGNOSIS — U071 COVID-19: Secondary | ICD-10-CM | POA: Diagnosis not present

## 2021-11-24 HISTORY — DX: COVID-19: U07.1

## 2021-11-24 MED ORDER — BENZONATATE 200 MG PO CAPS
200.0000 mg | ORAL_CAPSULE | Freq: Three times a day (TID) | ORAL | 0 refills | Status: DC | PRN
Start: 2021-11-24 — End: 2021-12-31

## 2021-11-24 NOTE — Progress Notes (Unsigned)
   Virtual Visit via Telephone   I connected with  [name]  on 11/25/21 by telephone/telehealth and verified that I am speaking with the correct person using two identifiers.   I discussed the limitations, risks, security and privacy concerns of performing an evaluation and management service by telephone, including the higher likelihood of inaccurate diagnosis and treatment, and the availability of in person appointments.  We also discussed the likely need of an additional face to face encounter for complete and high quality delivery of care.  I also discussed with the patient that there may be a patient responsible charge related to this service. The patient expressed understanding and wishes to proceed.  Provider location is in medical facility. Patient location is at their home, different from provider location. People involved in care of the patient during this telehealth encounter were myself, my nurse/medical assistant, and my front office/scheduling team member.  Review of Systems: No fevers, chills, night sweats, weight loss, chest pain, or shortness of breath.   Objective Findings:    General: Speaking full sentences, no audible heavy breathing.  Sounds alert and appropriately interactive.    Independent interpretation of tests performed by another provider:   None.  Brief History, Exam, Impression, and Recommendations:    COVID-19 Patient is a 66 year old female with concerns related to current URI and testing positive for coronavirus at home.  Reports that over the past few days she has been having persistent cough, nasal congestion, intermittent diarrhea.  She denies any recent fevers.  About 2 days ago, she did test positive for coronavirus at home.  She denies any notable shortness of breath, able to complete usual activities around the house without significant dyspnea.  Reports that she is up-to-date with coronavirus vaccinations and boosters. During encounter, patient  is able to speak in complete sentences, no significant shortness of breath when talking, did have intermittent coughing. Discussed options with patient and recommendations for conservative measures, symptomatic treatment.  Discussed possible role for coronavirus specific treatment such as Paxlovid.  Decision was made to hold this therapy at this time. In regards to her sinus congestion, discussed possible utilization of nasal sprays including nasal saline spray as well as intranasal steroid spray.  Can utilize nasal saline spray 2-3 times per day and intranasal steroid spray once daily, would recommend utilizing this spray at night Regards to her cough, will send in prescription for Tessalon Perles for patient to utilize up to 3 times per day as needed Recommend monitoring for any worsening of symptoms, particularly worsening in shortness of breath, discussed presenting to emergency department should any notable dyspnea develop  I discussed the above assessment and treatment plan with the patient. The patient was provided an opportunity to ask questions and all were answered. The patient agreed with the plan and demonstrated an understanding of the instructions.  The patient was advised to call back or seek an in-person evaluation if the symptoms worsen or if the condition fails to improve as anticipated.  I provided 15 minutes of face to face and non-face-to-face time during this encounter date, time was needed to gather information, review chart, records, communicate/coordinate with staff remotely, as well as complete documentation.   ___________________________________________ Consuelo Thayne de Peru, MD, ABFM, CAQSM Primary [facility] and Sports [facility]

## 2021-11-25 NOTE — Assessment & Plan Note (Signed)
Patient is a 66 year old female with concerns related to current URI and testing positive for coronavirus at home.  Reports that over the past few days she has been having persistent cough, nasal congestion, intermittent diarrhea.  She denies any recent fevers.  About 2 days ago, she did test positive for coronavirus at home.  She denies any notable shortness of breath, able to complete usual activities around the house without significant dyspnea.  Reports that she is up-to-date with coronavirus vaccinations and boosters. During encounter, patient is able to speak in complete sentences, no significant shortness of breath when talking, did have intermittent coughing. Discussed options with patient and recommendations for conservative measures, symptomatic treatment.  Discussed possible role for coronavirus specific treatment such as Paxlovid.  Decision was made to hold this therapy at this time. In regards to her sinus congestion, discussed possible utilization of nasal sprays including nasal saline spray as well as intranasal steroid spray.  Can utilize nasal saline spray 2-3 times per day and intranasal steroid spray once daily, would recommend utilizing this spray at night Regards to her cough, will send in prescription for Tessalon Perles for patient to utilize up to 3 times per day as needed Recommend monitoring for any worsening of symptoms, particularly worsening in shortness of breath, discussed presenting to emergency department should any notable dyspnea develop

## 2021-12-05 ENCOUNTER — Telehealth (HOSPITAL_BASED_OUTPATIENT_CLINIC_OR_DEPARTMENT_OTHER): Payer: Self-pay | Admitting: Nurse Practitioner

## 2021-12-05 NOTE — Telephone Encounter (Signed)
Pt came by to drop off form form daughter for provider to sign when pt was here stated the telemed visit on 7/31 with Dr. De Peru did not have her new ins. in. Insurance is updated now needing claim resent so ins. Will be billed. Please advise.

## 2021-12-05 NOTE — Telephone Encounter (Signed)
Claim was resubmitted on 8/11 @ 10:42 to pts new in. [employer] adv. through medicare.

## 2021-12-18 ENCOUNTER — Other Ambulatory Visit: Payer: Self-pay | Admitting: Obstetrics & Gynecology

## 2021-12-18 DIAGNOSIS — Z1231 Encounter for screening mammogram for malignant neoplasm of breast: Secondary | ICD-10-CM

## 2021-12-25 DIAGNOSIS — M1990 Unspecified osteoarthritis, unspecified site: Secondary | ICD-10-CM | POA: Diagnosis not present

## 2021-12-25 DIAGNOSIS — K5900 Constipation, unspecified: Secondary | ICD-10-CM | POA: Diagnosis not present

## 2021-12-25 DIAGNOSIS — N951 Menopausal and female climacteric states: Secondary | ICD-10-CM | POA: Diagnosis not present

## 2021-12-25 DIAGNOSIS — F3341 Major depressive disorder, recurrent, in partial remission: Secondary | ICD-10-CM | POA: Diagnosis not present

## 2021-12-25 DIAGNOSIS — E663 Overweight: Secondary | ICD-10-CM | POA: Diagnosis not present

## 2021-12-25 DIAGNOSIS — K5790 Diverticulosis of intestine, part unspecified, without perforation or abscess without bleeding: Secondary | ICD-10-CM | POA: Diagnosis not present

## 2021-12-25 DIAGNOSIS — I10 Essential (primary) hypertension: Secondary | ICD-10-CM | POA: Diagnosis not present

## 2021-12-25 DIAGNOSIS — U099 Post covid-19 condition, unspecified: Secondary | ICD-10-CM | POA: Diagnosis not present

## 2021-12-25 DIAGNOSIS — F419 Anxiety disorder, unspecified: Secondary | ICD-10-CM | POA: Diagnosis not present

## 2021-12-25 DIAGNOSIS — R32 Unspecified urinary incontinence: Secondary | ICD-10-CM | POA: Diagnosis not present

## 2021-12-25 DIAGNOSIS — G8929 Other chronic pain: Secondary | ICD-10-CM | POA: Diagnosis not present

## 2021-12-31 ENCOUNTER — Ambulatory Visit (INDEPENDENT_AMBULATORY_CARE_PROVIDER_SITE_OTHER): Payer: PPO | Admitting: Nurse Practitioner

## 2021-12-31 ENCOUNTER — Encounter (HOSPITAL_BASED_OUTPATIENT_CLINIC_OR_DEPARTMENT_OTHER): Payer: Self-pay | Admitting: Nurse Practitioner

## 2021-12-31 VITALS — BP 134/89 | HR 78 | Ht 64.0 in | Wt 170.6 lb

## 2021-12-31 DIAGNOSIS — E785 Hyperlipidemia, unspecified: Secondary | ICD-10-CM | POA: Diagnosis not present

## 2021-12-31 DIAGNOSIS — J302 Other seasonal allergic rhinitis: Secondary | ICD-10-CM

## 2021-12-31 DIAGNOSIS — I10 Essential (primary) hypertension: Secondary | ICD-10-CM

## 2021-12-31 DIAGNOSIS — E559 Vitamin D deficiency, unspecified: Secondary | ICD-10-CM | POA: Diagnosis not present

## 2021-12-31 DIAGNOSIS — F418 Other specified anxiety disorders: Secondary | ICD-10-CM

## 2021-12-31 DIAGNOSIS — B001 Herpesviral vesicular dermatitis: Secondary | ICD-10-CM | POA: Diagnosis not present

## 2021-12-31 DIAGNOSIS — R7303 Prediabetes: Secondary | ICD-10-CM | POA: Diagnosis not present

## 2021-12-31 DIAGNOSIS — Z23 Encounter for immunization: Secondary | ICD-10-CM

## 2021-12-31 MED ORDER — BENAZEPRIL HCL 40 MG PO TABS: 40.0000 mg | ORAL_TABLET | Freq: Every day | ORAL | 3 refills | Status: DC

## 2021-12-31 MED ORDER — ACYCLOVIR 400 MG PO TABS: 400.0000 mg | ORAL_TABLET | Freq: Three times a day (TID) | ORAL | 6 refills | Status: DC

## 2021-12-31 MED ORDER — CITALOPRAM HYDROBROMIDE 40 MG PO TABS: 40.0000 mg | ORAL_TABLET | Freq: Every day | ORAL | 3 refills | Status: DC

## 2021-12-31 MED ORDER — LEVOCETIRIZINE DIHYDROCHLORIDE 5 MG PO TABS: 5.0000 mg | ORAL_TABLET | Freq: Every evening | ORAL | 3 refills | Status: DC

## 2021-12-31 NOTE — Assessment & Plan Note (Signed)
Chronic.  Following a low-carb low-fat diet and exercising routinely.  We will monitor the hemoglobin A1c today to ensure that she is in good control.  Patient praised for starting back on her exercise regimen.  We will monitor closely.

## 2021-12-31 NOTE — Progress Notes (Signed)
Amy Clamp, DNP, AGNP-c [facility] [address] [place], [place] [address] [phone] Office [phone] Fax  ESTABLISHED PATIENT- Chronic Health and/or Follow-Up Visit  Blood pressure 134/89, pulse 78, height 5\' 4"  (1.626 m), weight 170 lb 9.6 oz (77.4 kg), last menstrual period 04/27/1992, SpO2 98 %.    @NAMEBYAGE @ is a 66 y.o. year old female presenting today for evaluation and management of the following: Follow-up (Patient presents today for follow up BP and labs. She did bring BP record. She is fasting this morning. Patient is due for flu shot she would like that today. )   BRIEF HISTORY, IMPRESSION, RECOMMENDATIONS, and ROS  1. Primary hypertension 2. Hyperlipidemia, unspecified hyperlipidemia type 3. Prediabetes Home blood pressure readings consistently in the 120's/80's after medication dosing She has been exercising on her elliptical machine every other day for 3 miles. She endorses feeling good overall  She feels like her health has improved.  She is following a low carb, low fat diet.  She tells me she is taking her medication as prescribed and has no side effects.  She denies CP, ShOB, LE edema, palpitations, dizziness, HA  She did have COVID in July and she tells me it took her about 6 weeks to recover, but she is feeling better now  4. Vitamin D deficiency Repeat labs today  5. Need for viral immunization High dose flu vaccine provided today  6. Situational anxiety She tells me this has been a little worse lately Her mother passed away this past 07-21-22 and that has been difficult on her   7. Seasonal allergic rhinitis, unspecified trigger Doing well on xyzal. Needs refills today  8. Cold sore Well controlled with acyclovir during outbreak. Needs refill today.     All ROS negative with exception of what is listed above.   PHYSICAL EXAM Physical Exam Vitals and nursing note reviewed.   Constitutional:      General: She is not in acute distress.    Appearance: Normal appearance.  HENT:     Head: Normocephalic.  Eyes:     Extraocular Movements: Extraocular movements intact.     Conjunctiva/sclera: Conjunctivae normal.     Pupils: Pupils are equal, round, and reactive to light.  Neck:     Vascular: No carotid bruit.  Cardiovascular:     Rate and Rhythm: Normal rate and regular rhythm.     Pulses: Normal pulses.     Heart sounds: Normal heart sounds. No murmur heard. Pulmonary:     Effort: Pulmonary effort is normal.     Breath sounds: Normal breath sounds. No wheezing.  Abdominal:     General: Bowel sounds are normal. There is no distension.     Palpations: Abdomen is soft.     Tenderness: There is no abdominal tenderness. There is no guarding.  Musculoskeletal:        General: Normal range of motion.     Cervical back: Normal range of motion and neck supple.     Right lower leg: No edema.     Left lower leg: No edema.  Lymphadenopathy:     Cervical: No cervical adenopathy.  Skin:    General: Skin is warm and dry.     Capillary Refill: Capillary refill takes less than 2 seconds.  Neurological:     General: No focal deficit present.     Mental Status: She is alert and oriented to person, place, and time.  Psychiatric:  Mood and Affect: Mood normal.        Behavior: Behavior normal.        Thought Content: Thought content normal.        Judgment: Judgment normal.     PLAN Problem List Items Addressed This Visit     Hypertension - Primary    Chronic.  Blood pressures have been very well controlled at home.  Recommend continuing to monitor blood pressure on a routine basis after taking medication and keeping a record of this to ensure that blood pressures remain well controlled.  We will plan to continue benazepril.  Refills have been provided today.  We will repeat kidney and electrolyte function today.  No alarm symptoms present.      Relevant  Medications   benazepril (LOTENSIN) 40 MG tablet   Other Relevant Orders   CBC With Diff/Platelet   Comprehensive metabolic panel   Hemoglobin A1c   VITAMIN D 25 Hydroxy (Vit-D Deficiency, Fractures)   Lipid panel   Vitamin D deficiency    Historical.  We will repeat labs today for further evaluation.      Relevant Orders   CBC With Diff/Platelet   Comprehensive metabolic panel   Hemoglobin A1c   VITAMIN D 25 Hydroxy (Vit-D Deficiency, Fractures)   Lipid panel   Allergic rhinitis    Chronic.  She has good control with Xyzal.  We will send in refills today.      Relevant Medications   levocetirizine (XYZAL) 5 MG tablet   Hyperlipidemia    Chronic.  She has been working on [employer] and has been exercising more recently.  We will plan to monitor labs today.      Relevant Medications   benazepril (LOTENSIN) 40 MG tablet   Other Relevant Orders   CBC With Diff/Platelet   Comprehensive metabolic panel   Hemoglobin A1c   VITAMIN D 25 Hydroxy (Vit-D Deficiency, Fractures)   Lipid panel   Prediabetes    Chronic.  Following a low-carb low-fat diet and exercising routinely.  We will monitor the hemoglobin A1c today to ensure that she is in good control.  Patient praised for starting back on her exercise regimen.  We will monitor closely.      Relevant Orders   CBC With Diff/Platelet   Comprehensive metabolic panel   Hemoglobin A1c   VITAMIN D 25 Hydroxy (Vit-D Deficiency, Fractures)   Lipid panel   Situational anxiety    Anxiety has been a little bit worse since the passing of her mother in March.  She does appear to be managing fairly well overall and has a positive outlook which is encouraging.  Recommend continuation of current medications for now.  We did discuss the option to slowly taper off of daily medication but I have encouraged her to wait until after the holidays before we make that decision.  Okay to refill as needed.      Relevant Medications    citalopram (CELEXA) 40 MG tablet   Other Visit Diagnoses     Need for viral immunization       Relevant Orders   Flu Vaccine QUAD 6+ mos PF IM (Fluarix Quad PF) (Completed)   Cold sore       Relevant Medications   acyclovir (ZOVIRAX) 400 MG tablet        Return in about 6 months (around 07/01/2022) for HTN, HLD, Thyroid.   Amy Clamp, DNP, AGNP-c 12/31/2021  9:09 AM

## 2021-12-31 NOTE — Assessment & Plan Note (Signed)
Chronic.  She has been working on [employer] and has been exercising more recently.  We will plan to monitor labs today.

## 2021-12-31 NOTE — Assessment & Plan Note (Signed)
Anxiety has been a little bit worse since the passing of her mother in March.  She does appear to be managing fairly well overall and has a positive outlook which is encouraging.  Recommend continuation of current medications for now.  We did discuss the option to slowly taper off of daily medication but I have encouraged her to wait until after the holidays before we make that decision.  Okay to refill as needed.

## 2021-12-31 NOTE — Patient Instructions (Addendum)
It was a pleasure seeing you today. I hope your time spent with [place] was pleasant and helpful. Please let us know if there is anything we can do to improve the service you receive.   I will be in touch with you about your lab results and if we need to make any changes I will let you know Your home readings are looking good! Keep up the good work.  I recommend the Pneumonia vaccine in about 2-3 weeks.     Important Office Information Lab Results If labs were ordered, please note that you will see results through MyChart as soon as they come available from [facility].  It takes up to 5 business days for the results to be routed to me and for me to review them once all of the lab results have come through from [facility]. I will make recommendations based on your results and send these through MyChart or someone from the office will call you to discuss. If your labs are abnormal, we may contact you to schedule a visit to discuss the results and make recommendations.  If you have not heard from [place] within 5 business days or you have waited longer than a week and your lab results have not come through on MyChart, please feel free to call the office or send a message through MyChart to follow-up on these labs.   Referrals If referrals were placed today, the office where the referral was sent will contact you either by phone or through MyChart to set up scheduling. Please note that it can take up to a week for the referral office to contact you. If you do not hear from them in a week, please contact the referral office directly to inquire about scheduling.   Condition Treated If your condition worsens or you begin to have new symptoms, please schedule a follow-up appointment for further evaluation. If you are not sure if an appointment is needed, you may call the office to leave a message for the nurse and someone will contact you with recommendations.  If you have an urgent or life threatening emergency, please  do not call the office, but seek emergency evaluation by calling 911 or going to the nearest emergency room for evaluation.   MyChart and Phone Calls Please do not use MyChart for urgent messages. It may take up to 3 business days for MyChart messages to be read by staff and if they are unable to handle the request, an additional 3 business days for them to be routed to me and for my response.  Messages sent to the provider through MyChart do not come directly to the provider, please allow time for these messages to be routed and for me to respond.  We get a large volume of MyChart messages daily and these are responded to in the order received.   For urgent messages, please call the office at [phone] and speak with the front office staff or leave a message on the line of my assistant for guidance.  We are seeing patients from the hours of 8:00 am through 5:00 pm and calls directly to the nurse may not be answered immediately due to seeing patients, but your call will be returned as soon as possible.  Phone  messages received after 4:00 PM Monday through Thursday may not be returned until the following business day. Phone messages received after 11:00 AM on Friday may not be returned until Monday.   After Hours We share on call  hours with providers from other offices. If you have an urgent need after hours that cannot wait until the next business day, please contact the on call provider by calling the office number. A nurse will speak with you and contact the provider if needed for recommendations.  If you have an urgent or life threatening emergency after hours, please do not call the on call provider, but seek emergency evaluation by calling 911 or going to the nearest emergency room for evaluation.   Paperwork All paperwork requires a minimum of 5 days to complete and return to you or the designated personnel. Please keep this in mind when bringing in forms or sending requests for paperwork  completion to the office.

## 2021-12-31 NOTE — Assessment & Plan Note (Signed)
Chronic.  She has good control with Xyzal.  We will send in refills today.

## 2021-12-31 NOTE — Assessment & Plan Note (Signed)
Historical.  We will repeat labs today for further evaluation.

## 2021-12-31 NOTE — Assessment & Plan Note (Signed)
Chronic.  Blood pressures have been very well controlled at home.  Recommend continuing to monitor blood pressure on a routine basis after taking medication and keeping a record of this to ensure that blood pressures remain well controlled.  We will plan to continue benazepril.  Refills have been provided today.  We will repeat kidney and electrolyte function today.  No alarm symptoms present.

## 2022-01-01 ENCOUNTER — Other Ambulatory Visit (HOSPITAL_BASED_OUTPATIENT_CLINIC_OR_DEPARTMENT_OTHER): Payer: Self-pay | Admitting: Nurse Practitioner

## 2022-01-01 DIAGNOSIS — F418 Other specified anxiety disorders: Secondary | ICD-10-CM

## 2022-01-01 LAB — CBC WITH DIFF/PLATELET
Basophils Absolute: 0 10*3/uL (ref 0.0–0.2)
Basos: 1 %
EOS (ABSOLUTE): 0.1 10*3/uL (ref 0.0–0.4)
Eos: 2 %
Hematocrit: 42.8 % (ref 34.0–46.6)
Hemoglobin: 13.5 g/dL (ref 11.1–15.9)
Immature Grans (Abs): 0 10*3/uL (ref 0.0–0.1)
Immature Granulocytes: 0 %
Lymphocytes Absolute: 1.4 10*3/uL (ref 0.7–3.1)
Lymphs: 29 %
MCH: 27.1 pg (ref 26.6–33.0)
MCHC: 31.5 g/dL (ref 31.5–35.7)
MCV: 86 fL (ref 79–97)
Monocytes Absolute: 0.5 10*3/uL (ref 0.1–0.9)
Monocytes: 10 %
Neutrophils Absolute: 2.7 10*3/uL (ref 1.4–7.0)
Neutrophils: 58 %
Platelets: 211 10*3/uL (ref 150–450)
RBC: 4.98 x10E6/uL (ref 3.77–5.28)
RDW: 13.9 % (ref 11.7–15.4)
WBC: 4.7 10*3/uL (ref 3.4–10.8)

## 2022-01-01 LAB — COMPREHENSIVE METABOLIC PANEL
ALT: 9 IU/L (ref 0–32)
AST: 14 IU/L (ref 0–40)
Albumin/Globulin Ratio: 2.3 — ABNORMAL HIGH (ref 1.2–2.2)
Albumin: 4.5 g/dL (ref 3.9–4.9)
Alkaline Phosphatase: 54 IU/L (ref 44–121)
BUN/Creatinine Ratio: 24 (ref 12–28)
BUN: 14 mg/dL (ref 8–27)
Bilirubin Total: 0.4 mg/dL (ref 0.0–1.2)
CO2: 25 mmol/L (ref 20–29)
Calcium: 9.5 mg/dL (ref 8.7–10.3)
Chloride: 103 mmol/L (ref 96–106)
Creatinine, Ser: 0.59 mg/dL (ref 0.57–1.00)
Globulin, Total: 2 g/dL (ref 1.5–4.5)
Glucose: 99 mg/dL (ref 70–99)
Potassium: 3.9 mmol/L (ref 3.5–5.2)
Sodium: 141 mmol/L (ref 134–144)
Total Protein: 6.5 g/dL (ref 6.0–8.5)
eGFR: 100 mL/min/{1.73_m2} (ref >59)

## 2022-01-01 LAB — LIPID PANEL
Chol/HDL Ratio: 3 ratio (ref 0.0–4.4)
Cholesterol, Total: 219 mg/dL — ABNORMAL HIGH (ref 100–199)
HDL: 73 mg/dL (ref >39)
LDL Chol Calc (NIH): 133 mg/dL — ABNORMAL HIGH (ref 0–99)
Triglycerides: 72 mg/dL (ref 0–149)
VLDL Cholesterol Cal: 13 mg/dL (ref 5–40)

## 2022-01-01 LAB — HEMOGLOBIN A1C
Est. average glucose Bld gHb Est-mCnc: 126 mg/dL
Hgb A1c MFr Bld: 6 % — ABNORMAL HIGH (ref 4.8–5.6)

## 2022-01-01 LAB — VITAMIN D 25 HYDROXY (VIT D DEFICIENCY, FRACTURES): Vit D, 25-Hydroxy: 45.8 ng/mL (ref 30.0–100.0)

## 2022-01-07 ENCOUNTER — Ambulatory Visit
Admission: RE | Admit: 2022-01-07 | Discharge: 2022-01-07 | Disposition: A | Discharge status: Home / Self Care | Payer: PPO | Source: Ambulatory Visit | Attending: Obstetrics & Gynecology | Admitting: Obstetrics & Gynecology

## 2022-01-07 DIAGNOSIS — Z1231 Encounter for screening mammogram for malignant neoplasm of breast: Secondary | ICD-10-CM | POA: Diagnosis not present

## 2022-04-03 ENCOUNTER — Ambulatory Visit (INDEPENDENT_AMBULATORY_CARE_PROVIDER_SITE_OTHER): Payer: PPO | Admitting: Obstetrics & Gynecology

## 2022-04-03 ENCOUNTER — Encounter (HOSPITAL_BASED_OUTPATIENT_CLINIC_OR_DEPARTMENT_OTHER): Payer: Self-pay | Admitting: Obstetrics & Gynecology

## 2022-04-03 VITALS — BP 122/75 | HR 83 | Ht 64.5 in | Wt 160.0 lb

## 2022-04-03 DIAGNOSIS — Z79890 Hormone replacement therapy (postmenopausal): Secondary | ICD-10-CM

## 2022-04-03 DIAGNOSIS — Z9189 Other specified personal risk factors, not elsewhere classified: Secondary | ICD-10-CM | POA: Diagnosis not present

## 2022-04-03 DIAGNOSIS — B009 Herpesviral infection, unspecified: Secondary | ICD-10-CM | POA: Diagnosis not present

## 2022-04-03 DIAGNOSIS — Z90710 Acquired absence of both cervix and uterus: Secondary | ICD-10-CM | POA: Diagnosis not present

## 2022-04-03 DIAGNOSIS — N951 Menopausal and female climacteric states: Secondary | ICD-10-CM

## 2022-04-03 MED ORDER — ESTRADIOL 0.5 MG PO TABS: 0.5000 mg | ORAL_TABLET | Freq: Every day | ORAL | 4 refills | Status: DC

## 2022-04-03 NOTE — Progress Notes (Signed)
66 y.o. G2P2 Married White or Caucasian female here for breast and pelvic exam.  Denies vaginal bleeding.  On HRT and does not want to stop.  Reviewed risks today.  Mother passed this year.  Were able to get her house sold.  [facility] is basically done and will be closed out this month.    Denies vaginal bleeding.  Patient's last menstrual period was 04/27/1992.          Sexually active: Yes.    H/O STD:  yes  Health Maintenance: PCP:  Enid Skeens.  Last wellness appt was 06/30/2021.  Did blood work 12/2021 Vaccines are up to date:  no.  We discussed pneumonia vaccination and RSV vaccination Colonoscopy:  09/28/2012 MMG:  01/07/2022 Negative BMD:  10/23/2019 osteopenia Last pap smear:  20 H/o abnormal pap smear:  no    reports that she has never smoked. She has never used smokeless tobacco. She reports current alcohol use of about 2.0 - 3.0 standard drinks of alcohol per week. She reports that she does not use drugs.  Past Medical History:  Diagnosis Date   Broken ankle 1/16   will have surgery on 01/30/15-right ankle   Elevated hemoglobin A1c 01/17/15   6.0   Endometriosis    Herpes simplex type 1 infection    Hypertension    Migraine    h/o migraines, none since hysterectomy   Situational depression    Varicose vein    surgery on right leg    Past Surgical History:  Procedure Laterality Date   ABDOMINAL HYSTERECTOMY  1994   LAVH/RSO   ANKLE SURGERY Right 01/30/2015   BREAST BIOPSY Left    CESAREAN SECTION     CHOLECYSTECTOMY     COLON RESECTION  11/06   and lap LSO   CYSTECTOMY     HERNIA REPAIR  1982   HERNIA REPAIR  2003   and bladder repair   SHOULDER SURGERY Right 04/2017   Tendon repair     Current Outpatient Medications  Medication Sig Dispense Refill   acyclovir (ZOVIRAX) 400 MG tablet Take 1 tablet (400 mg total) by mouth 3 (three) times daily. 30 tablet 6   ALPRAZolam (XANAX) 0.5 MG tablet TAKE 1/2 TO 1 TABLET(0.25 TO 0.5 MG) BY MOUTH AT BEDTIME AS NEEDED  FOR SLEEP 30 tablet 3   aspirin 325 MG tablet Take 325 mg by mouth every other day.     benazepril (LOTENSIN) 40 MG tablet Take 1 tablet (40 mg total) by mouth daily. 90 tablet 3   CALCIUM PO Take by mouth daily.     citalopram (CELEXA) 40 MG tablet Take 1 tablet (40 mg total) by mouth daily. 90 tablet 3   Coenzyme Q10 (COQ-10 PO) Take by mouth.     KRILL OIL PO Take by mouth. Omega red krill oil     levocetirizine (XYZAL) 5 MG tablet Take 1 tablet (5 mg total) by mouth every evening. 90 tablet 3   Red Yeast Rice Extract (RED YEAST RICE PO) Take by mouth daily.     vitamin C (ASCORBIC ACID) 500 MG tablet Take 1,000 mg by mouth 3 (three) times daily.     estradiol (ESTRACE) 0.5 MG tablet Take 1 tablet (0.5 mg total) by mouth daily. 90 tablet 4   No current facility-administered medications for this visit.    Family History  Problem Relation Age of Onset   Skin cancer Other    Lung cancer Father    Spina bifida  Daughter        and hydrocephalia   Diabetes Mother    Hypertension Mother    Thyroid nodules Mother        being watched   Diabetes Sister        x 3   Hypertension Sister    Thyroid nodules Sister     Review of Systems  Constitutional: Negative.   Genitourinary: Negative.     Exam:   BP 122/75 (BP Location: Right Arm, Patient Position: Sitting, Cuff Size: Normal)   Pulse 83   Ht 5' 4.5" (1.638 m)   Wt 160 lb (72.6 kg)   LMP 04/27/1992   BMI 27.04 kg/m   Height: 5' 4.5" (163.8 cm)  General appearance: alert, cooperative and appears stated age Breasts: normal appearance, no masses or tenderness Abdomen: soft, non-tender; bowel sounds normal; no masses,  no organomegaly Lymph nodes: Cervical, supraclavicular, and axillary nodes normal.  No abnormal inguinal nodes palpated Neurologic: Grossly normal  Pelvic: External genitalia:  no lesions              Urethra:  normal appearing urethra with no masses, tenderness or lesions              Bartholins and Skenes:  normal                 Vagina: normal appearing vagina with atrophic changes and no discharge, no lesions              Cervix: absent              Pap taken: No. Bimanual Exam:  Uterus:  uterus absent              Adnexa: no mass, fullness, tenderness               Rectovaginal: Confirms               Anus:  normal sphincter tone, no lesions  Chaperone, Ina Homes, CMA, was present for exam.  Assessment/Plan: High risk medicare patient -- Pap smear not indicated - Mammogram up to date - Colonoscopy due next year - Bone mineral density 2021 - lab work done with PCP, [facility] Dec Early - vaccines reviewed/updated  2. Menopausal syndrome on hormone replacement therapy - estradiol (ESTRACE) 0.5 MG tablet; Take 1 tablet (0.5 mg total) by mouth daily.  Dispense: 90 tablet; Refill: 4  3. Herpes simplex type 1 infection - does not need acyclovir rx  4. H/O abdominal hysterectomy

## 2022-06-30 ENCOUNTER — Encounter: Payer: Self-pay | Admitting: Nurse Practitioner

## 2022-06-30 ENCOUNTER — Ambulatory Visit (INDEPENDENT_AMBULATORY_CARE_PROVIDER_SITE_OTHER): Payer: PPO | Admitting: Nurse Practitioner

## 2022-06-30 VITALS — BP 124/82 | HR 86 | Wt 175.8 lb

## 2022-06-30 DIAGNOSIS — J301 Allergic rhinitis due to pollen: Secondary | ICD-10-CM

## 2022-06-30 DIAGNOSIS — R7303 Prediabetes: Secondary | ICD-10-CM | POA: Diagnosis not present

## 2022-06-30 DIAGNOSIS — F418 Other specified anxiety disorders: Secondary | ICD-10-CM

## 2022-06-30 DIAGNOSIS — Z23 Encounter for immunization: Secondary | ICD-10-CM | POA: Diagnosis not present

## 2022-06-30 DIAGNOSIS — F4321 Adjustment disorder with depressed mood: Secondary | ICD-10-CM

## 2022-06-30 DIAGNOSIS — I10 Essential (primary) hypertension: Secondary | ICD-10-CM

## 2022-06-30 DIAGNOSIS — K219 Gastro-esophageal reflux disease without esophagitis: Secondary | ICD-10-CM | POA: Diagnosis not present

## 2022-06-30 DIAGNOSIS — E785 Hyperlipidemia, unspecified: Secondary | ICD-10-CM

## 2022-06-30 DIAGNOSIS — F5102 Adjustment insomnia: Secondary | ICD-10-CM | POA: Diagnosis not present

## 2022-06-30 DIAGNOSIS — E559 Vitamin D deficiency, unspecified: Secondary | ICD-10-CM | POA: Diagnosis not present

## 2022-06-30 MED ORDER — ALPRAZOLAM 0.5 MG PO TABS: ORAL_TABLET | ORAL | 3 refills | Status: DC

## 2022-06-30 MED ORDER — MELOXICAM 15 MG PO TABS: 15.0000 mg | ORAL_TABLET | Freq: Every day | ORAL | 0 refills | Status: DC | PRN

## 2022-06-30 NOTE — Assessment & Plan Note (Signed)
Blood pressures are well controlled in the office today. She reports that blood pressure is well-controlled and within the desired range at home. No symptoms are present.  Plan: -Continue benazepril '40mg'$ .  -Labs pending today -Notify the office if blood pressures and consistently greater than 130/85 on home readings.

## 2022-06-30 NOTE — Progress Notes (Unsigned)
Worthy Keeler, DNP, AGNP-c [place]  [address] [employer] [facility] [place], [place] [address] [phone]  ESTABLISHED PATIENT- Chronic Health and/or Follow-Up Visit  Blood pressure 124/82, pulse 86, weight 175 lb 12.8 oz (79.7 kg), last menstrual period 04/27/1992.    [name] is a 67 y.o. year old female presenting today for evaluation and management of the following:  Deena reports difficulty coping with the loss of her mother, especially during the holidays and her mother's recent birthday. She had considered discontinuing her antidepressant medication before the holidays, but after discussion decided against it. She tells me that she is glad she continued on the regimen. At this time she does not feel that she is ready to taper off.   She has experienced some weight gain, increasing from 160 to 175 lbs, which she attributes to the holidays and a decrease in her exercise routine. She reports that she has a lack of motivation some days due to her depression. She is working through this and plans to start exercising regularly.   Her blood pressure has been well-controlled, and she has been monitoring it regularly.   She has been experiencing allergy symptoms and takes cetirizine or levocetirizine nightly for relief.   The patient has a history of pneumonia seven or eight years ago and received an RSV vaccine in the fall, she would like to have the pneumonia vaccine today, if possible.  She reports some back soreness due to moving patio furniture and flower pots yesterday but denies any chest pain or shortness of breath.   All ROS negative with exception of what is listed above.   PHYSICAL EXAM Physical Exam Vitals and nursing note reviewed.  Constitutional:      General: She is not in acute distress.    Appearance: Normal appearance.  HENT:     Head: Normocephalic.  Eyes:     Extraocular Movements: Extraocular movements intact.     Conjunctiva/sclera:  Conjunctivae normal.     Pupils: Pupils are equal, round, and reactive to light.  Neck:     Vascular: No carotid bruit.  Cardiovascular:     Rate and Rhythm: Normal rate and regular rhythm.     Pulses: Normal pulses.     Heart sounds: Normal heart sounds. No murmur heard. Pulmonary:     Effort: Pulmonary effort is normal.     Breath sounds: Normal breath sounds. No wheezing.  Abdominal:     General: Bowel sounds are normal. There is no distension.     Palpations: Abdomen is soft.     Tenderness: There is no abdominal tenderness. There is no guarding.  Musculoskeletal:        General: Normal range of motion.     Cervical back: Normal range of motion and neck supple.     Right lower leg: No edema.     Left lower leg: No edema.  Lymphadenopathy:     Cervical: No cervical adenopathy.  Skin:    General: Skin is warm and dry.     Capillary Refill: Capillary refill takes less than 2 seconds.  Neurological:     General: No focal deficit present.     Mental Status: She is alert and oriented to person, place, and time.  Psychiatric:        Mood and Affect: Mood normal.        Behavior: Behavior normal.        Thought Content: Thought content normal.        Judgment: Judgment  normal.     PLAN Problem List Items Addressed This Visit     Hypertension - Primary   Vitamin D deficiency   Allergic rhinitis   Hyperlipidemia   Prediabetes   Situational anxiety   Insomnia   Situational depression    No follow-ups on file.   Worthy Keeler, DNP, AGNP-c 06/30/2022  8:27 AM

## 2022-06-30 NOTE — Patient Instructions (Signed)
My thoughts and prayers are with you right now. You are doing wonderful.   If you have any concerns please don't hesitate to let me know.

## 2022-07-01 LAB — COMPREHENSIVE METABOLIC PANEL
ALT: 12 IU/L (ref 0–32)
AST: 14 IU/L (ref 0–40)
Albumin/Globulin Ratio: 2.1 (ref 1.2–2.2)
Albumin: 4.2 g/dL (ref 3.9–4.9)
Alkaline Phosphatase: 60 IU/L (ref 44–121)
BUN/Creatinine Ratio: 26 (ref 12–28)
BUN: 16 mg/dL (ref 8–27)
Bilirubin Total: 0.2 mg/dL (ref 0.0–1.2)
CO2: 23 mmol/L (ref 20–29)
Calcium: 9.3 mg/dL (ref 8.7–10.3)
Chloride: 101 mmol/L (ref 96–106)
Creatinine, Ser: 0.62 mg/dL (ref 0.57–1.00)
Globulin, Total: 2 g/dL (ref 1.5–4.5)
Glucose: 78 mg/dL (ref 70–99)
Potassium: 4.4 mmol/L (ref 3.5–5.2)
Sodium: 139 mmol/L (ref 134–144)
Total Protein: 6.2 g/dL (ref 6.0–8.5)
eGFR: 99 mL/min/{1.73_m2} (ref >59)

## 2022-07-01 LAB — CBC WITH DIFFERENTIAL/PLATELET
Basophils Absolute: 0.1 10*3/uL (ref 0.0–0.2)
Basos: 1 %
EOS (ABSOLUTE): 0.1 10*3/uL (ref 0.0–0.4)
Eos: 1 %
Hematocrit: 39.7 % (ref 34.0–46.6)
Hemoglobin: 12.5 g/dL (ref 11.1–15.9)
Immature Grans (Abs): 0 10*3/uL (ref 0.0–0.1)
Immature Granulocytes: 0 %
Lymphocytes Absolute: 1.2 10*3/uL (ref 0.7–3.1)
Lymphs: 22 %
MCH: 26.9 pg (ref 26.6–33.0)
MCHC: 31.5 g/dL (ref 31.5–35.7)
MCV: 86 fL (ref 79–97)
Monocytes Absolute: 0.5 10*3/uL (ref 0.1–0.9)
Monocytes: 9 %
Neutrophils Absolute: 3.9 10*3/uL (ref 1.4–7.0)
Neutrophils: 67 %
Platelets: 234 10*3/uL (ref 150–450)
RBC: 4.64 x10E6/uL (ref 3.77–5.28)
RDW: 13.8 % (ref 11.7–15.4)
WBC: 5.8 10*3/uL (ref 3.4–10.8)

## 2022-07-01 LAB — VITAMIN D 25 HYDROXY (VIT D DEFICIENCY, FRACTURES): Vit D, 25-Hydroxy: 31.2 ng/mL (ref 30.0–100.0)

## 2022-07-01 LAB — HEMOGLOBIN A1C
Est. average glucose Bld gHb Est-mCnc: 123 mg/dL
Hgb A1c MFr Bld: 5.9 % — ABNORMAL HIGH (ref 4.8–5.6)

## 2022-07-01 NOTE — Assessment & Plan Note (Signed)
Currently managed with levocetirizine nightly and doing well with that medication. Also using fluticasone during the allergy seasons.  Plan: - Continue current therapy

## 2022-07-01 NOTE — Assessment & Plan Note (Signed)
Anxiety, depression, and intermittent insomnia due to recent loss of mother and emotions that accompany this type of change. She is using xanax intermittently for sleep, which helps reduce frequent awakenings.  Plan: - Xanax refill sent.  - Continue to use this as little as possible to avoid complications such as physical dependence for sleep.

## 2022-07-01 NOTE — Assessment & Plan Note (Signed)
Occasional reflux symptoms managed by [name]. No concerning symptoms. Plan: - Continue current therapy

## 2022-07-01 NOTE — Assessment & Plan Note (Signed)
Reactive anxiety related to grief over loss of mother. She is currently managed with citalopram. The holidays and recent birthday of her mother have been difficult for her, but she appears to be doing well. At this time there are no alarm symptoms. We discussed reaching out immediately if she feels her symptoms are worsening. She is only using xanax very intermittently when she is unable to sleep with other means.  Plan: - Continue citalopram daily.  - OK to use xanax as needed for anxiety - Monitor your mood closely and reach out if you feel it is declining.

## 2022-07-01 NOTE — Assessment & Plan Note (Signed)
Reactive depression related to grief over loss of mother. She is currently managed with citalopram. The holidays and recent birthday of her mother have been difficult for her, but she appears to be doing well. At this time there are no alarm symptoms. We discussed reaching out immediately if she feels her symptoms are worsening.  Plan: - Continue citalopram daily.  - OK to use xanax as needed for anxiety - Monitor your mood closely and reach out if you feel it is declining.

## 2022-07-01 NOTE — Assessment & Plan Note (Signed)
Currently diet controlled. She does admit to variation from her dietary habits recently. We may see an increase in her levels due to this.  Plan: - Work on getting back on diet plan with low carb (150-180 grams a day), protein (30 grams a day), and fiber (30 grams a day).  - Labs pending

## 2022-07-01 NOTE — Assessment & Plan Note (Signed)
Slight increase in weight due to diet and exercise changes over the holidays. Suspect there may be a slight increase in her lipids. Not currently on medication management. Plan: - Monitor diet and work to keep saturated fats less than 13 grams a day and increase fiber intake to a goal of about 30 grams a day.  - Labs pending

## 2022-07-02 ENCOUNTER — Ambulatory Visit (HOSPITAL_BASED_OUTPATIENT_CLINIC_OR_DEPARTMENT_OTHER): Payer: PPO | Admitting: Nurse Practitioner

## 2022-09-17 DIAGNOSIS — L820 Inflamed seborrheic keratosis: Secondary | ICD-10-CM | POA: Diagnosis not present

## 2022-09-17 DIAGNOSIS — D485 Neoplasm of uncertain behavior of skin: Secondary | ICD-10-CM | POA: Diagnosis not present

## 2022-09-17 DIAGNOSIS — L280 Lichen simplex chronicus: Secondary | ICD-10-CM | POA: Diagnosis not present

## 2022-09-28 ENCOUNTER — Other Ambulatory Visit: Payer: Self-pay | Admitting: Nurse Practitioner

## 2022-09-28 NOTE — Telephone Encounter (Signed)
Refill request last apt 06/30/22 

## 2022-12-24 ENCOUNTER — Other Ambulatory Visit: Payer: Self-pay | Admitting: Nurse Practitioner

## 2022-12-24 DIAGNOSIS — F418 Other specified anxiety disorders: Secondary | ICD-10-CM

## 2023-01-01 ENCOUNTER — Other Ambulatory Visit: Payer: Self-pay | Admitting: Obstetrics & Gynecology

## 2023-01-01 DIAGNOSIS — Z1231 Encounter for screening mammogram for malignant neoplasm of breast: Secondary | ICD-10-CM

## 2023-01-05 ENCOUNTER — Ambulatory Visit (INDEPENDENT_AMBULATORY_CARE_PROVIDER_SITE_OTHER): Payer: PPO | Admitting: Nurse Practitioner

## 2023-01-05 ENCOUNTER — Encounter: Payer: Self-pay | Admitting: Nurse Practitioner

## 2023-01-05 VITALS — BP 122/82 | HR 74 | Ht 65.5 in | Wt 186.2 lb

## 2023-01-05 DIAGNOSIS — Z0000 Encounter for general adult medical examination without abnormal findings: Secondary | ICD-10-CM | POA: Diagnosis not present

## 2023-01-05 DIAGNOSIS — Z6830 Body mass index (BMI) 30.0-30.9, adult: Secondary | ICD-10-CM | POA: Diagnosis not present

## 2023-01-05 DIAGNOSIS — M8580 Other specified disorders of bone density and structure, unspecified site: Secondary | ICD-10-CM | POA: Insufficient documentation

## 2023-01-05 DIAGNOSIS — F418 Other specified anxiety disorders: Secondary | ICD-10-CM

## 2023-01-05 DIAGNOSIS — E559 Vitamin D deficiency, unspecified: Secondary | ICD-10-CM | POA: Diagnosis not present

## 2023-01-05 DIAGNOSIS — E785 Hyperlipidemia, unspecified: Secondary | ICD-10-CM | POA: Diagnosis not present

## 2023-01-05 DIAGNOSIS — R7303 Prediabetes: Secondary | ICD-10-CM

## 2023-01-05 DIAGNOSIS — J302 Other seasonal allergic rhinitis: Secondary | ICD-10-CM

## 2023-01-05 DIAGNOSIS — I10 Essential (primary) hypertension: Secondary | ICD-10-CM

## 2023-01-05 DIAGNOSIS — Z23 Encounter for immunization: Secondary | ICD-10-CM | POA: Diagnosis not present

## 2023-01-05 MED ORDER — LEVOCETIRIZINE DIHYDROCHLORIDE 5 MG PO TABS: 5.0000 mg | ORAL_TABLET | Freq: Every evening | ORAL | 3 refills | Status: AC

## 2023-01-05 MED ORDER — BENAZEPRIL HCL 40 MG PO TABS
40.0000 mg | ORAL_TABLET | Freq: Every day | ORAL | 3 refills | Status: DC
Start: 2023-01-05 — End: 2024-01-03

## 2023-01-05 NOTE — Progress Notes (Unsigned)
01/05/2023   Vitals:  BP 122/82   Pulse 74   Ht 5' 5.5" (1.664 m)   Wt 186 lb 3.2 oz (84.5 kg)   LMP 04/27/1992   BMI 30.51 kg/m   Body mass index is 30.51 kg/m. [name] is a 67 y.o. female who presents for Initial Medicare Annual Wellness Exam  Care Team Members: Current Providers as of 01/05/2023 PCP: Tollie Eth, NP Encounter Provider: Tollie Eth, NP, starting on Tue Jan 05, 2023 12:00 AM Referring Provider: Tollie Eth, NP, starting on Tue Jan 05, 2023 12:00 AM Attending Provider: Tollie Eth, NP, starting on Tue Jun 30, 2022  9:17 AM (Active) Nurse Practitioner: Tollie Eth, NP, starting on Tue Jan 05, 2023  7:34 AM (Active)   Method of visit:  in person In the event virtual visit conducted, the patient consented to a virtual visit. Patient consented to have virtual visit and was identified by two identifiers.  Encounter participants: Patient: [name] - located [facility] Patient Visit Location: In Office Nurse/Provider: Tollie Eth - located Virtual Visit Location Provider: Office/Clinic Others (if applicable): patient only   Review of Systems:  Neuro: Denies difficulty remembering daily tasks, people, or places.  Ear: Denies difficulty hearing or need to increase volume on television or telephone to hear Eye: Denies visual changes, difficulty reading normal print, or visual field deficits. Cardiac: Denies chest pain, palpitations, dizziness, shortness of breath, pain in lower extremities, or night time waking with shortness of breath. Lung: Denies shortness of breath, difficulty breathing, chronic cough, or dizziness.  GI: Denies changes in bowel habits, blood in stool, difficulty passing stool, decreased intake of food or drink, nausea, or vomiting.  GU: Denies changes in urinary habits, dark urine, malodorous urine, increased or decreased urination, or urinary incontinence.  MSK: Denies weakness in extremities, difficulty walking, difficulty  grasping, or new MSK pain.  Skin: Denies changes to the skin, fragile skin, or increased bruising.  Constitution: Denies fatigue, weakness, or confusion.   Patient rating of health: worse as this time last year. She feels these are related to weight changes.   Annual Goal:  Goals       Patient Stated (pt-stated)      Exercise more. I would like to get back to working on the elliptical again.           Hospitalizations in the Past Year: none  ED Visits in the Past Year: No  Surgeries in the Past Year: No   Dietary Habits: Eats 3 meals per day. Snacks sometimes Drinks at least  8  8 ounce glasses of water a day. No sodas.   Physical Exam: Yes-completed Physical Exam Vitals and nursing note reviewed.  Constitutional:      Appearance: Normal appearance. She is not ill-appearing.  HENT:     Head: Normocephalic and atraumatic.     Right Ear: Tympanic membrane normal.     Left Ear: Tympanic membrane normal.     Nose: Nose normal.     Mouth/Throat:     Mouth: Mucous membranes are moist.     Pharynx: Oropharynx is clear.  Eyes:     Extraocular Movements: Extraocular movements intact.     Conjunctiva/sclera: Conjunctivae normal.     Pupils: Pupils are equal, round, and reactive to light.  Neck:     Vascular: No carotid bruit.  Cardiovascular:     Rate and Rhythm: Normal rate and regular rhythm.  Pulses: Normal pulses.     Heart sounds: Normal heart sounds.  Pulmonary:     Effort: Pulmonary effort is normal.     Breath sounds: Normal breath sounds.  Abdominal:     General: Bowel sounds are normal. There is no distension.     Palpations: Abdomen is soft. There is no mass.     Tenderness: There is no abdominal tenderness. There is no right CVA tenderness, left CVA tenderness, guarding or rebound.  Musculoskeletal:        General: Normal range of motion.     Cervical back: Normal range of motion and neck supple.     Right lower leg: No edema.     Left lower  leg: No edema.  Lymphadenopathy:     Cervical: No cervical adenopathy.  Skin:    General: Skin is warm and dry.     Capillary Refill: Capillary refill takes less than 2 seconds.  Neurological:     General: No focal deficit present.     Mental Status: She is alert and oriented to person, place, and time.  Psychiatric:        Mood and Affect: Mood normal.        Behavior: Behavior normal.     Activities of Daily Living    01/05/2023    8:18 AM  In your present state of health, do you have any difficulty performing the following activities:  Hearing? 1  Comment little bit some ringing  Vision? 0  Difficulty concentrating or making decisions? 0  Walking or climbing stairs? 1  Comment knees  Dressing or bathing? 0  Doing errands, shopping? 0  Preparing Food and eating ? N  Using the Toilet? N  In the past six months, have you accidently leaked urine? Y  Do you have problems with loss of bowel control? N  Managing your Medications? N  Managing your Finances? N  Housekeeping or managing your Housekeeping? N    Social Determinants of Health SDOH Screenings   Food Insecurity: No Food Insecurity (01/05/2023)  Housing: Low Risk  (01/05/2023)  Transportation Needs: No Transportation Needs (01/05/2023)  Utilities: Not At Risk (01/05/2023)  Alcohol Screen: Low Risk  (06/03/2020)  Depression (PHQ2-9): Low Risk  (01/05/2023)  Financial Resource Strain: Low Risk  (01/05/2023)  Physical Activity: Insufficiently Active (01/05/2023)  Social Connections: Socially Integrated (01/05/2023)  Stress: No Stress Concern Present (01/05/2023)  Tobacco Use: Low Risk  (01/05/2023)     Tobacco Social History   Tobacco Use  Smoking Status Never   Passive exposure: Never  Smokeless Tobacco Never     Counseling given: Not Answered   Functional Status Survey: Is the patient deaf or have difficulty hearing?: Yes (little bit some ringing) Does the patient have difficulty seeing, even when wearing  glasses/contacts?: No Does the patient have difficulty concentrating, remembering, or making decisions?: No Does the patient have difficulty walking or climbing stairs?: Yes (knees) Does the patient have difficulty dressing or bathing?: No Does the patient have difficulty doing errands alone such as visiting a doctor's office or shopping?: No   Clinical Intake: Pre-visit preparation completed: Yes  BMI - recorded: 30.51 Nutritional Status: BMI > 30  Obese Nutritional Risks: Unintentional weight gain Diabetes: No  Activities of Daily Living: Independent Ambulation: Independent Medication Administration: Independent Home Management: Independent  Barriers to Care Management & Learning: None  Do you feel unsafe in your current relationship?: No Do you feel physically threatened by others?: No Anyone hurting you at  home, work, or school?: No Unable to ask?: No Information provided on Community resources: No  How often do you need to have someone help you when you read instructions, pamphlets, or other written materials from your doctor or pharmacy?: 1 - Never What is the last grade level you completed in school?: 1 year of University  Interpreter Needed?: No  Information entered by :: Enid Skeens, DNP     01/05/2023    8:17 AM  Advanced Directives  Does Patient Have a Medical Advance Directive? Yes  Type of Estate agent of [place];Living will  Does patient want to make changes to medical advance directive? Yes (ED - Information included in AVS)  Copy of Healthcare Power of Attorney in Chart? No - copy requested     Fall Risk    01/05/2023    8:15 AM 11/24/2021    2:45 PM 06/30/2021   12:45 PM 06/03/2020    8:19 AM 02/23/2020    8:01 AM  Fall Risk   Falls in the past year? 0 0 0 0 0  Number falls in past yr: 0 0 0  0  Injury with Fall? 0 0 0  0  Risk for fall due to : No Fall Risks No Fall Risks No Fall Risks No Fall Risks   Follow up Falls evaluation  completed Falls evaluation completed Falls evaluation completed Falls evaluation completed Falls evaluation completed    Depression Screen    01/05/2023    8:16 AM 04/03/2022    9:21 AM 11/24/2021    2:47 PM 06/30/2021   12:46 PM  PHQ 2/9 Scores  PHQ - 2 Score 0 0 0 2  PHQ- 9 Score   0 8  Exception Documentation   Medical reason     Cognitive Function Normal: Yes Exam Completed:     01/05/2023    8:46 AM  MMSE - Mini Mental State Exam  Orientation to time 5  Orientation to Place 5  Registration 3  Attention/ Calculation 5  Recall 3  Language- name 2 objects 2  Language- repeat 1  Language- follow 3 step command 3  Language- read & follow direction 1  Write a sentence 1  Copy design 1  Total score 30        Immunization History  Administered Date(s) Administered   Fluad Trivalent(High Dose 65+) 01/05/2023   Influenza Whole 01/13/2013   Influenza, Seasonal, Injecte, Preservative Fre 01/05/2018   Influenza,inj,Quad PF,6+ Mos 01/18/2017, 02/15/2020, 12/31/2021   Influenza-Unspecified 01/29/2011, 01/25/2014, 01/18/2015, 12/26/2015, 01/11/2019, 01/15/2021   Moderna SARS-COV2 Booster Vaccination 02/10/2021   Moderna Sars-Covid-2 Vaccination 04/29/2020   PFIZER(Purple Top)SARS-COV-2 Vaccination 07/12/2019, 08/02/2019   PNEUMOCOCCAL CONJUGATE-20 06/30/2022   Td 01/29/2011, 01/29/2011   Tdap 01/29/2011, 11/25/2017   Zoster Recombinant(Shingrix) 07/23/2017, 09/13/2017    Screening Tests Health Maintenance  Topic Date Due   COVID-19 Vaccine (4 - 2023-24 season) 01/21/2023 (Originally 12/27/2022)   Colonoscopy  02/25/2023 (Originally 09/29/2022)   Medicare Annual Wellness (AWV)  01/05/2024   MAMMOGRAM  01/08/2024   DTaP/Tdap/Td (5 - Td or Tdap) 11/26/2027   Pneumonia Vaccine 61+ Years old  Completed   INFLUENZA VACCINE  Completed   DEXA SCAN  Completed   Hepatitis C Screening  Completed   Zoster Vaccines- Shingrix  Completed   HPV VACCINES  Aged Out    Health  Maintenance Screenings  The patient has no Health Maintenance topics of status Overdue, Due On, or Due Soon    Past Medical History:  Diagnosis Date   Arthritis    Broken ankle 04/2014   will have surgery on 01/30/15-right ankle   Elevated hemoglobin A1c 01/17/2015   6.0   Endometriosis    Herpes simplex type 1 infection    Hypertension    Migraine    h/o migraines, none since hysterectomy   Situational depression    Varicose vein    surgery on right leg   Past Surgical History:  Procedure Laterality Date   ABDOMINAL HYSTERECTOMY  1994   LAVH/RSO   ANKLE SURGERY Right 01/30/2015   BREAST BIOPSY Left    CESAREAN SECTION     CHOLECYSTECTOMY     COLON RESECTION  11/06   and lap LSO   CYSTECTOMY     HERNIA REPAIR  1982   HERNIA REPAIR  2003   and bladder repair   SHOULDER SURGERY Right 04/2017   Tendon repair    Family History  Problem Relation Age of Onset   Skin cancer Other    Lung cancer Father    Spina bifida Daughter        and hydrocephalia   Diabetes Mother    Hypertension Mother    Thyroid nodules Mother        being watched   Diabetes Sister        x 3   Hypertension Sister    Thyroid nodules Sister    Social History   Socioeconomic History   Marital status: Married    Spouse name: Not on file   Number of children: 2   Years of education: Not on file   Highest education level: High school graduate  Occupational History   Not on file  Tobacco Use   Smoking status: Never    Passive exposure: Never   Smokeless tobacco: Never  Vaping Use   Vaping status: Never Used  Substance and Sexual Activity   Alcohol use: Yes    Alcohol/week: 2.0 - 3.0 standard drinks of alcohol    Types: 2 - 3 Glasses of wine per week    Comment: Occassionally   Drug use: No   Sexual activity: Yes    Partners: Male    Birth control/protection: Surgical    Comment: LAVH/RSO, then LSO  Other Topics Concern   Not on file  Social History Narrative   She has two  daughters. One daughter lives across the street with her family. Her oldest daughter lives at home with her, she has spina bifida.    Social Determinants of Health   Financial Resource Strain: Low Risk  (01/05/2023)   Overall Financial Resource Strain (CARDIA)    Difficulty of Paying Living Expenses: Not very hard  Food Insecurity: No Food Insecurity (01/05/2023)   Hunger Vital Sign    Worried About Running Out of Food in the Last Year: Never true    Ran Out of Food in the Last Year: Never true  Transportation Needs: No Transportation Needs (01/05/2023)   PRAPARE - Administrator, Civil Service (Medical): No    Lack of Transportation (Non-Medical): No  Physical Activity: Insufficiently Active (01/05/2023)   Exercise Vital Sign    Days of Exercise per Week: 2 days    Minutes of Exercise per Session: 30 min  Stress: No Stress Concern Present (01/05/2023)   [employer] of Occupational Health - Occupational Stress Questionnaire    Feeling of Stress : Only a little  Social Connections: Socially Integrated (01/05/2023)   Social Connection and Isolation Panel [  NHANES]    Frequency of Communication with Friends and Family: More than three times a week    Frequency of Social Gatherings with Friends and Family: Twice a week    Attends Religious Services: More than 4 times per year    Active Member of [employer] or Organizations: Yes    Attends Banker Meetings: 1 to 4 times per year    Marital Status: Married    Outpatient Encounter Medications as of 01/05/2023  Medication Sig   ALPRAZolam (XANAX) 0.5 MG tablet TAKE 1/2 TO 1 TABLET(0.25 TO 0.5 MG) BY MOUTH AT BEDTIME AS NEEDED FOR SLEEP   aspirin 325 MG tablet Take 325 mg by mouth every other day.   CALCIUM PO Take by mouth daily.   citalopram (CELEXA) 40 MG tablet TAKE 1 TABLET(40 MG) BY MOUTH DAILY   Coenzyme Q10 (COQ-10 PO) Take by mouth.   estradiol (ESTRACE) 0.5 MG tablet Take 1 tablet (0.5 mg total) by mouth  daily.   KRILL OIL PO Take by mouth. Omega red krill oil   meloxicam (MOBIC) 15 MG tablet TAKE 1 TABLET(15 MG) BY MOUTH DAILY AS NEEDED FOR ARTHRITIS PAIN   Red Yeast Rice Extract (RED YEAST RICE PO) Take by mouth daily.   vitamin C (ASCORBIC ACID) 500 MG tablet Take 1,000 mg by mouth 3 (three) times daily.   [DISCONTINUED] benazepril (LOTENSIN) 40 MG tablet Take 1 tablet (40 mg total) by mouth daily.   [DISCONTINUED] levocetirizine (XYZAL) 5 MG tablet Take 1 tablet (5 mg total) by mouth every evening.   acyclovir (ZOVIRAX) 400 MG tablet Take 1 tablet (400 mg total) by mouth 3 (three) times daily. (Patient not taking: Reported on 06/30/2022)   benazepril (LOTENSIN) 40 MG tablet Take 1 tablet (40 mg total) by mouth daily.   levocetirizine (XYZAL) 5 MG tablet Take 1 tablet (5 mg total) by mouth every evening.   No facility-administered encounter medications on file as of 01/05/2023.    PLAN  Exercise Activities and Dietary Recommendations - keep track of how long I exercise - keep track of how often I exercise - use fitness equipment - take the stairs instead of the elevator Carb modified diet  Fall Prevention - always wear shoes or slippers with non-slip sole - get at least 10 minutes of activity every day - use a nightlight in the bathroom  Preventative Screenings Lung: Low Dose CT Chest recommended if Age 59-80 years, 30 pack-year currently smoking OR have quit w/in 15years. NA HIV Screen: (once in lifetime or more often if high risk): is up to date Hep C Screen: is up to date Abdominal Aortic US (one per lifetime in men only 65-75 who have ever smoked or have family hx AAA): NA Low Dose CT (55-75 asymptomatic w/30 pack year smoking hx, Current smoker, Quit smoking in last 15y, Yearly if abnormal): NA   Labs Needed: CBC (q 75mo for HTN): Yes CMP w/ GFR (q 75mo for HTN, DM, or DM Screening): Yes Lipids (q 75mo for HTN or DM): Yes A1c (q 79mo 7.1 or greater, q 71mo 7.0 or less DM):  No PSA (all men 50-70 q 75mo for Prostate Ca Screening): No Urine Microalbumin (q 75mo for DM- not required if on ACE/ARB): No UA (as needed): No Culture (if UA positive): No  Urinary frequency, dysuria, confusion, malodor  Orders Placed This Encounter  Procedures   Flu Vaccine Trivalent High Dose (Fluad)   CBC with Differential/Platelet   CMP14+EGFR  Hemoglobin A1c   Lipid panel     I have personally reviewed and noted the following in the patient's chart:   Medical and social history Use of alcohol, tobacco or illicit drugs  Current medications and supplements Functional ability and status Nutritional status Physical activity Advanced directives List of other physicians Hospitalizations, surgeries, and ER visits in previous 12 months Vitals Screenings to include cognitive, depression, and falls Referrals and appointments  In addition, I have reviewed and discussed with patient certain preventive protocols, quality metrics, and best practice recommendations. A written personalized care plan for preventive services as well as general preventive health recommendations were provided to patient.   Tollie Eth, NP  01/05/2023

## 2023-01-05 NOTE — Patient Instructions (Addendum)
Start by taking 1/2 of the citalopram once a day. Do this for at least 4 weeks then we can cut down further to 1/2 tab every other day for at least 4 weeks. Then cut down to 1/2 tab every 3 days for 4 weeks. Then you can stop.  If at any point you feel that your mood is worsening, step back up to the previous dose and let me know.   WEIGHT LOSS PLANNING Your progress today shows:     01/05/2023    8:19 AM 06/30/2022    8:26 AM 04/03/2022    9:18 AM  Vitals with BMI  Height 5' 5.5"  5' 4.5"  Weight 186 lbs 3 oz 175 lbs 13 oz 160 lbs  BMI 30.5  27.05  Systolic 122 124 409  Diastolic 82 82 75  Pulse 74 86 83    For best management of weight, it is vital to balance intake versus output. This means the number of calories burned per day must be less than the calories you take in with food and drink.   I recommend trying to follow a diet with the following: Calories: 1200-1500 calories per day Carbohydrates: 150-180 grams of carbohydrates per day  Why: Gives your body enough "quick fuel" for cells to maintain normal function without sending them into starvation mode.  Protein: At least 90 grams of protein per day- 30 grams with each meal Why: Protein takes longer and uses more energy than carbohydrates to break down for fuel. The carbohydrates in your meals serves as quick energy sources and proteins help use some of that extra quick energy to break down to produce long term energy. This helps you not feel hungry as quickly and protein breakdown burns calories.  Water: Drink AT LEAST 64 ounces of water per day  Why: Water is essential to healthy metabolism. Water helps to fill the stomach and keep you fuller longer. Water is required for healthy digestion and filtering of waste in the body.  Fat: Limit fats in your diet- when choosing fats, choose foods with lower fats content such as lean meats (chicken, fish, [place]).  Why: Increased fat intake leads to storage "for later". Once you burn your  carbohydrate energy, your body goes into fat and protein breakdown mode to help you loose weight.  Cholesterol: Fats and oils that are LIQUID at room temperature are best. Choose vegetable oils (olive oil, avocado oil, nuts). Avoid fats that are SOLID at room temperature (animal fats, processed meats). Healthy fats are often found in whole grains, beans, nuts, seeds, and berries.  Why: Elevated cholesterol levels lead to build up of cholesterol on the inside of your blood vessels. This will eventually cause the blood vessels to become hard and can lead to high blood pressure and damage to your organs. When the blood flow is reduced, but the pressure is high from cholesterol buildup, parts of the cholesterol can break off and form clots that can go to the brain or heart leading to a stroke or heart attack.  Fiber: Increase amount of SOLUBLE the fiber in your diet. This helps to fill you up, lowers cholesterol, and helps with digestion. Some foods high in soluble fiber are oats, peas, beans, apples, carrots, barley, and citrus fruits.   Why: Fiber fills you up, helps remove excess cholesterol, and aids in healthy digestion which are all very important in weight management.   I recommend the following as a minimum activity routine: Purposeful walk or other  physical activity at least 20 minutes every single day. This means purposefully taking a walk, jog, bike, swim, treadmill, elliptical, dance, etc.  This activity should be ABOVE your normal daily activities, such as walking at work. Goal exercise should be at least 150 minutes a week- work your way up to this.   Heart Rate: Your maximum exercise heart rate should be 220 - Your Age in Years. When exercising, get your heart rate up, but avoid going over the maximum targeted heart rate.  60-70% of your maximum heart rate is where you tend to burn the most fat. To find this number:  220 - Age In Years= Max HR  Max HR x 0.6 (or 0.7) = Fat Burning HR The  Fat Burning HR is your goal heart rate while working out to burn the most fat.  NEVER exercise to the point your feel lightheaded, weak, nauseated, dizzy. If you experience ANY of these symptoms- STOP exercise! Allow yourself to cool down and your heart rate to come down. Then restart slower next time.  If at ANY TIME you feel chest pain or chest pressure during exercise, STOP IMMEDIATELY and seek medical attention.

## 2023-01-06 DIAGNOSIS — Z6832 Body mass index (BMI) 32.0-32.9, adult: Secondary | ICD-10-CM | POA: Insufficient documentation

## 2023-01-06 DIAGNOSIS — Z6830 Body mass index (BMI) 30.0-30.9, adult: Secondary | ICD-10-CM | POA: Insufficient documentation

## 2023-01-06 LAB — CBC WITH DIFFERENTIAL/PLATELET
Basophils Absolute: 0.1 10*3/uL (ref 0.0–0.2)
Basos: 1 %
EOS (ABSOLUTE): 0.1 10*3/uL (ref 0.0–0.4)
Eos: 3 %
Hematocrit: 43.2 % (ref 34.0–46.6)
Hemoglobin: 13.6 g/dL (ref 11.1–15.9)
Immature Grans (Abs): 0 10*3/uL (ref 0.0–0.1)
Immature Granulocytes: 0 %
Lymphocytes Absolute: 1.2 10*3/uL (ref 0.7–3.1)
Lymphs: 29 %
MCH: 27 pg (ref 26.6–33.0)
MCHC: 31.5 g/dL (ref 31.5–35.7)
MCV: 86 fL (ref 79–97)
Monocytes Absolute: 0.5 10*3/uL (ref 0.1–0.9)
Monocytes: 12 %
Neutrophils Absolute: 2.3 10*3/uL (ref 1.4–7.0)
Neutrophils: 55 %
Platelets: 251 10*3/uL (ref 150–450)
RBC: 5.04 x10E6/uL (ref 3.77–5.28)
RDW: 13.9 % (ref 11.7–15.4)
WBC: 4.3 10*3/uL (ref 3.4–10.8)

## 2023-01-06 LAB — HEMOGLOBIN A1C
Est. average glucose Bld gHb Est-mCnc: 131 mg/dL
Hgb A1c MFr Bld: 6.2 % — ABNORMAL HIGH (ref 4.8–5.6)

## 2023-01-06 LAB — CMP14+EGFR
ALT: 10 IU/L (ref 0–32)
AST: 16 IU/L (ref 0–40)
Albumin: 4.3 g/dL (ref 3.9–4.9)
Alkaline Phosphatase: 58 IU/L (ref 44–121)
BUN/Creatinine Ratio: 31 — ABNORMAL HIGH (ref 12–28)
BUN: 18 mg/dL (ref 8–27)
Bilirubin Total: 0.4 mg/dL (ref 0.0–1.2)
CO2: 25 mmol/L (ref 20–29)
Calcium: 9 mg/dL (ref 8.7–10.3)
Chloride: 103 mmol/L (ref 96–106)
Creatinine, Ser: 0.59 mg/dL (ref 0.57–1.00)
Globulin, Total: 1.7 g/dL (ref 1.5–4.5)
Glucose: 103 mg/dL — ABNORMAL HIGH (ref 70–99)
Potassium: 4.4 mmol/L (ref 3.5–5.2)
Sodium: 140 mmol/L (ref 134–144)
Total Protein: 6 g/dL (ref 6.0–8.5)
eGFR: 99 mL/min/{1.73_m2} (ref >59)

## 2023-01-06 LAB — LIPID PANEL
Chol/HDL Ratio: 3.3 ratio (ref 0.0–4.4)
Cholesterol, Total: 256 mg/dL — ABNORMAL HIGH (ref 100–199)
HDL: 77 mg/dL (ref >39)
LDL Chol Calc (NIH): 163 mg/dL — ABNORMAL HIGH (ref 0–99)
Triglycerides: 92 mg/dL (ref 0–149)
VLDL Cholesterol Cal: 16 mg/dL (ref 5–40)

## 2023-01-06 NOTE — Assessment & Plan Note (Signed)
Chronic. Will repeat labs today. Currently on calcium supplement and oral estrogen. No alarm symptoms present.

## 2023-01-06 NOTE — Assessment & Plan Note (Signed)
Chronic. Stable. Rx for xyzal refilled today.

## 2023-01-06 NOTE — Assessment & Plan Note (Signed)
Chronic. BP stable No alarm symptoms present at this time.  taking as prescribed. Goal blood pressure less than less than 130/80. Currently is not followed with cardiology.  Plan: current treatment plan is effective, no change in therapy, orders and follow up as documented in Epic[facility].  Refills have been provided today. Labs today to check electrolytes and kidney function.  Will make changes to plan of care as necessary based on lab results.  Plan to follow-up in 6months.

## 2023-01-06 NOTE — Assessment & Plan Note (Signed)
Chronic. Lipids monitored today. No alarm symptoms present at this time. LDL Goal < 100. Controlled with  red yeast rice and krill oil .  Plan: Continue current lipid-lowering therapy. orders written for new lab studies as appropriate; see orders Recommend Treatment of hypertension  and Strict diet and exercise Diet and exercise recommendations provided.  Follow-up in 6months or sooner based on lab findings as appropriate.

## 2023-01-06 NOTE — Assessment & Plan Note (Signed)
Concerns expressed by patient over weight gain. We discussed diet and exercise. Will monitor labs today to ensure that there are not any conditions contributing. Recommend starting back with elliptical and monitoring diet closely. Information provided on AVS.  Plan: - Begin work out regimen again - Follow diet plan provided on AVS - Will make additional changes as necessary based on labs.

## 2023-01-06 NOTE — Assessment & Plan Note (Signed)
Chronic. Currently controlled with diet. No alarm symptoms are present at this time. We did discuss her concerns with weight gain and that this could be linked to her blood sugar control. Labs ordered for today.  Plan: - Continue with strict diet of low carb and increased protein - Begin using the elliptical again - Will make additional changes based on labs.  - F/U in 6 months or sooner pending labs

## 2023-01-15 ENCOUNTER — Ambulatory Visit
Admission: RE | Admit: 2023-01-15 | Discharge: 2023-01-15 | Disposition: A | Discharge status: Home / Self Care | Payer: PPO | Source: Ambulatory Visit | Attending: Obstetrics & Gynecology | Admitting: Obstetrics & Gynecology

## 2023-01-15 DIAGNOSIS — Z1231 Encounter for screening mammogram for malignant neoplasm of breast: Secondary | ICD-10-CM

## 2023-01-17 ENCOUNTER — Ambulatory Visit
Admission: EM | Admit: 2023-01-17 | Discharge: 2023-01-17 | Disposition: A | Discharge status: Home / Self Care | Payer: PPO | Attending: Family Medicine | Admitting: Family Medicine

## 2023-01-17 DIAGNOSIS — J3089 Other allergic rhinitis: Secondary | ICD-10-CM | POA: Diagnosis not present

## 2023-01-17 DIAGNOSIS — J4521 Mild intermittent asthma with (acute) exacerbation: Secondary | ICD-10-CM

## 2023-01-17 MED ORDER — ALBUTEROL SULFATE HFA 108 (90 BASE) MCG/ACT IN AERS: 2.0000 | INHALATION_SPRAY | RESPIRATORY_TRACT | 0 refills | Status: DC | PRN

## 2023-01-17 MED ORDER — IPRATROPIUM-ALBUTEROL 0.5-2.5 (3) MG/3ML IN SOLN
3.0000 mL | Freq: Once | RESPIRATORY_TRACT | Status: AC
  Administered 2023-01-17: 3 mL via RESPIRATORY_TRACT

## 2023-01-17 MED ORDER — DEXAMETHASONE SODIUM PHOSPHATE 10 MG/ML IJ SOLN
10.0000 mg | Freq: Once | INTRAMUSCULAR | Status: AC
  Administered 2023-01-17: 10 mg via INTRAMUSCULAR

## 2023-01-17 MED ORDER — PROMETHAZINE-DM 6.25-15 MG/5ML PO SYRP: 5.0000 mL | ORAL_SOLUTION | Freq: Four times a day (QID) | ORAL | 0 refills | Status: DC | PRN

## 2023-01-17 NOTE — ED Notes (Signed)
Pt O2 stat was 99 after duoneb

## 2023-01-17 NOTE — ED Provider Notes (Signed)
[facility]    CSN: [id number] Arrival date & time: 01/17/23  0813      History   Chief Complaint No chief complaint on file.   HPI [name] is a 67 y.o. female.   Patient presenting today with 1 week history of progressively worsening cough, shortness of breath, wheezing, chest tightness, sinus headache.  So far trying her typical allergy regimen, Mucinex sinus, NyQuil, DayQuil and cough drops with minimal relief.  She has never been diagnosed with asthma but does tend to have these breathing flareups with her allergy exacerbations in the fall.     Past Medical History:  Diagnosis Date   Arthritis    Broken ankle 04/2014   will have surgery on 01/30/15-right ankle   Elevated hemoglobin A1c 01/17/2015   6.0   Endometriosis    Herpes simplex type 1 infection    Hypertension    Migraine    h/o migraines, none since hysterectomy   Situational depression    Varicose vein    surgery on right leg    Patient Active Problem List   Diagnosis Date Noted   BMI 30.0-30.9,adult 01/06/2023   Osteopenia 01/05/2023   COVID-19 11/24/2021   Situational anxiety 01/17/2021   Prediabetes 12/05/2020   Controlled substance agreement signed 12/05/2020   Situational depression 12/04/2020   Insomnia 06/03/2020   Hyperlipidemia 03/22/2019   GERD (gastroesophageal reflux disease) 04/11/2018   Allergic rhinitis 12/09/2016   Ankle instability 03/07/2015   Vitamin D deficiency 12/04/2013   Herpes simplex type 1 infection    Hypertension    S/P right inguinal hernia repair-Davol 3D max mesh 2003 08/05/2012   S/P arthroscopy of right shoulder 08/05/2012    Past Surgical History:  Procedure Laterality Date   ABDOMINAL HYSTERECTOMY  1994   LAVH/RSO   ANKLE SURGERY Right 01/30/2015   BREAST BIOPSY Left    CESAREAN SECTION     CHOLECYSTECTOMY     COLON RESECTION  11/06   and lap LSO   CYSTECTOMY     HERNIA REPAIR  1982   HERNIA REPAIR  2003   and bladder repair    SHOULDER SURGERY Right 04/2017   Tendon repair     OB History     Gravida  2   Para  2   Term      Preterm      AB      Living  2      SAB      IAB      Ectopic      Multiple      Live Births               Home Medications    Prior to Admission medications   Medication Sig Start Date End Date Taking? Authorizing Provider  albuterol (VENTOLIN HFA) 108 (90 Base) MCG/ACT inhaler Inhale 2 puffs into the lungs every 4 (four) hours as needed for wheezing or shortness of breath. 01/17/23  Yes Particia Nearing, PA-C  promethazine-dextromethorphan (PROMETHAZINE-DM) 6.25-15 MG/5ML syrup Take 5 mLs by mouth 4 (four) times daily as needed. 01/17/23  Yes Particia Nearing, PA-C  acyclovir (ZOVIRAX) 400 MG tablet Take 1 tablet (400 mg total) by mouth 3 (three) times daily. Patient not taking: Reported on 06/30/2022 12/31/21   Early, Sung Amabile, NP  ALPRAZolam Prudy Feeler) 0.5 MG tablet TAKE 1/2 TO 1 TABLET(0.25 TO 0.5 MG) BY MOUTH AT BEDTIME AS NEEDED FOR SLEEP 06/30/22   Early, Sung Amabile,  NP  aspirin 325 MG tablet Take 325 mg by mouth every other day.    [provider]  benazepril (LOTENSIN) 40 MG tablet Take 1 tablet (40 mg total) by mouth daily. 01/05/23   Tollie Eth, NP  CALCIUM PO Take by mouth daily.    [provider]  citalopram (CELEXA) 40 MG tablet TAKE 1 TABLET(40 MG) BY MOUTH DAILY 12/24/22   Early, Sung Amabile, NP  Coenzyme Q10 (COQ-10 PO) Take by mouth.    [provider]  estradiol (ESTRACE) 0.5 MG tablet Take 1 tablet (0.5 mg total) by mouth daily. 04/03/22   Jerene Bears, MD  KRILL OIL PO Take by mouth. Omega red krill oil    [provider]  levocetirizine (XYZAL) 5 MG tablet Take 1 tablet (5 mg total) by mouth every evening. 01/05/23   Tollie Eth, NP  meloxicam (MOBIC) 15 MG tablet TAKE 1 TABLET(15 MG) BY MOUTH DAILY AS NEEDED FOR ARTHRITIS PAIN 12/24/22   Early, Sung Amabile, NP  Red Yeast Rice Extract (RED YEAST RICE PO) Take by mouth  daily.    [provider]  vitamin C (ASCORBIC ACID) 500 MG tablet Take 1,000 mg by mouth 3 (three) times daily.    [provider]    Family History Family History  Problem Relation Age of Onset   Skin cancer Other    Lung cancer Father    Spina bifida Daughter        and hydrocephalia   Diabetes Mother    Hypertension Mother    Thyroid nodules Mother        being watched   Diabetes Sister        x 3   Hypertension Sister    Thyroid nodules Sister     Social History Social History   Tobacco Use   Smoking status: Never    Passive exposure: Never   Smokeless tobacco: Never  Vaping Use   Vaping status: Never Used  Substance Use Topics   Alcohol use: Yes    Alcohol/week: 2.0 - 3.0 standard drinks of alcohol    Types: 2 - 3 Glasses of wine per week    Comment: Occassionally   Drug use: No     Allergies   Betadine [povidone iodine], Codeine, and Other   Review of Systems Review of Systems PER HPI  Physical Exam Triage Vital Signs ED Triage Vitals  Encounter Vitals Group     BP 01/17/23 0859 (!) 161/99     Systolic BP Percentile --      Diastolic BP Percentile --      Pulse Rate 01/17/23 0859 69     Resp 01/17/23 0859 18     Temp 01/17/23 0859 98.5 F (36.9 C)     Temp Source 01/17/23 0859 Oral     SpO2 01/17/23 0859 97 %     Weight --      Height --      Head Circumference --      Peak Flow --      Pain Score 01/17/23 0858 8     Pain Loc --      Pain Education --      Exclude from Growth Chart --    No data found.  Updated Vital Signs BP (!) 161/99 (BP Location: Right Arm)   Pulse 69   Temp 98.5 F (36.9 C) (Oral)   Resp 18   LMP 04/27/1992   SpO2 97%  Visual Acuity Right Eye Distance:   Left Eye Distance:   Bilateral Distance:    Right Eye Near:   Left Eye Near:    Bilateral Near:     Physical Exam Vitals and nursing note reviewed.  Constitutional:      Appearance: Normal appearance.  HENT:     Head:  Atraumatic.     Right Ear: Tympanic membrane and external ear normal.     Left Ear: Tympanic membrane and external ear normal.     Nose: Rhinorrhea present.     Mouth/Throat:     Mouth: Mucous membranes are moist.     Pharynx: Posterior oropharyngeal erythema present.  Eyes:     Extraocular Movements: Extraocular movements intact.     Conjunctiva/sclera: Conjunctivae normal.  Cardiovascular:     Rate and Rhythm: Normal rate and regular rhythm.     Heart sounds: Normal heart sounds.  Pulmonary:     Effort: Pulmonary effort is normal.     Breath sounds: Wheezing present. No rales.     Comments: Significant wheezes and coughing spells prior to DuoNeb treatment in clinic, lungs clear to auscultation bilaterally and significant symptomatic benefit after DuoNeb treatment in clinic. Musculoskeletal:        General: Normal range of motion.     Cervical back: Normal range of motion and neck supple.  Skin:    General: Skin is warm and dry.  Neurological:     Mental Status: She is alert and oriented to person, place, and time.  Psychiatric:        Mood and Affect: Mood normal.        Thought Content: Thought content normal.      UC Treatments / Results  Labs (all labs ordered are listed, but only abnormal results are displayed) Labs Reviewed - No data to display  EKG   Radiology No results found.  Procedures Procedures (including critical care time)  Medications Ordered in UC Medications  ipratropium-albuterol (DUONEB) 0.5-2.5 (3) MG/3ML nebulizer solution 3 mL (3 mLs Nebulization Given 01/17/23 0943)  dexamethasone (DECADRON) injection 10 mg (10 mg Intramuscular Given 01/17/23 1016)    Initial Impression / Assessment and Plan / UC Course  I have reviewed the triage vital signs and the nursing notes.  Pertinent labs & imaging results that were available during my care of the patient were reviewed by me and considered in my medical decision making (see chart for details).      Hypertensive in triage, otherwise vital signs within normal limits.  Consistent with allergy exacerbation with suspected reactive airway exacerbation.  Significant benefit.  DuoNeb treatment in clinic, could not take deep breaths without coughing fits prior to treatment and lungs clear to auscultation bilaterally with full resolution of this symptom post treatment.  IM Decadron, albuterol inhaler as needed, Phenergan DM.  Discussed supportive care medications and home care, continue allergy regimen.  Return for worsening symptoms.  Final Clinical Impressions(s) / UC Diagnoses   Final diagnoses:  Seasonal allergic rhinitis due to other allergic trigger  Mild intermittent reactive airway disease with acute exacerbation   Discharge Instructions   None    ED Prescriptions     Medication Sig Dispense Auth. Provider   promethazine-dextromethorphan (PROMETHAZINE-DM) 6.25-15 MG/5ML syrup Take 5 mLs by mouth 4 (four) times daily as needed. 100 mL Particia Nearing, PA-C   albuterol (VENTOLIN HFA) 108 (90 Base) MCG/ACT inhaler Inhale 2 puffs into the lungs every 4 (four) hours as needed for wheezing or  shortness of breath. 18 g Particia Nearing, [place]      PDMP not reviewed this encounter.   Particia Nearing, [place] 01/17/23 1047

## 2023-01-17 NOTE — ED Triage Notes (Addendum)
Pt reports she has a cough, sob, and a sinus headache x 3 days   Took fexofenadine, mucinex sinus, nyquil and cough drops

## 2023-02-16 DIAGNOSIS — Z980 Intestinal bypass and anastomosis status: Secondary | ICD-10-CM | POA: Diagnosis not present

## 2023-02-16 DIAGNOSIS — Z1211 Encounter for screening for malignant neoplasm of colon: Secondary | ICD-10-CM | POA: Diagnosis not present

## 2023-02-16 DIAGNOSIS — K5730 Diverticulosis of large intestine without perforation or abscess without bleeding: Secondary | ICD-10-CM | POA: Diagnosis not present

## 2023-02-16 LAB — HM COLONOSCOPY

## 2023-03-03 ENCOUNTER — Encounter: Payer: Self-pay | Admitting: Nurse Practitioner

## 2023-04-08 IMAGING — MG MM DIGITAL SCREENING BILAT W/ TOMO AND CAD
8 series · 8 of 24 positions shown · non-contrast
Comparison: Previous exam(s).

CLINICAL DATA: Screening.

EXAM:
DIGITAL SCREENING BILATERAL MAMMOGRAM WITH TOMOSYNTHESIS AND CAD
TECHNIQUE: Bilateral screening digital craniocaudal and mediolateral oblique
mammograms were obtained. Bilateral screening digital breast
tomosynthesis was performed. The images were evaluated with
computer-aided detection.

[L MLO synth-2D]
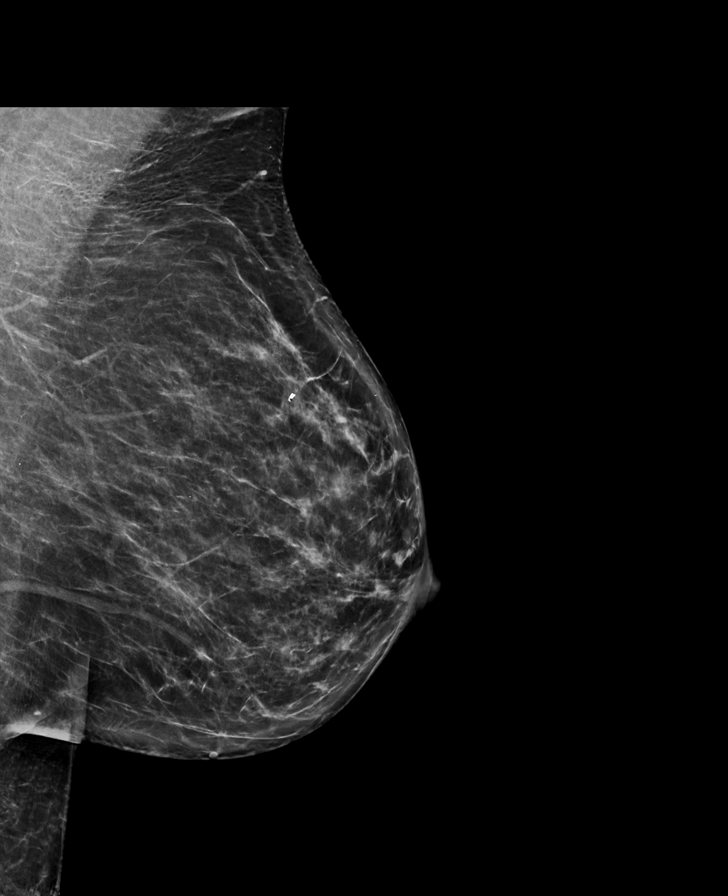

[R MLO synth-2D]
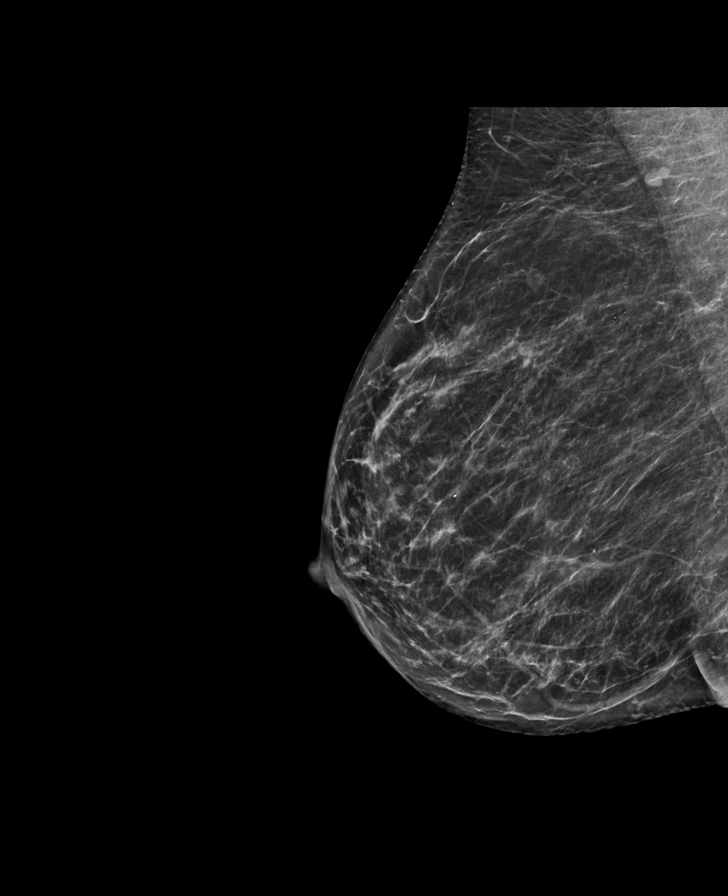

[L CC synth-2D]
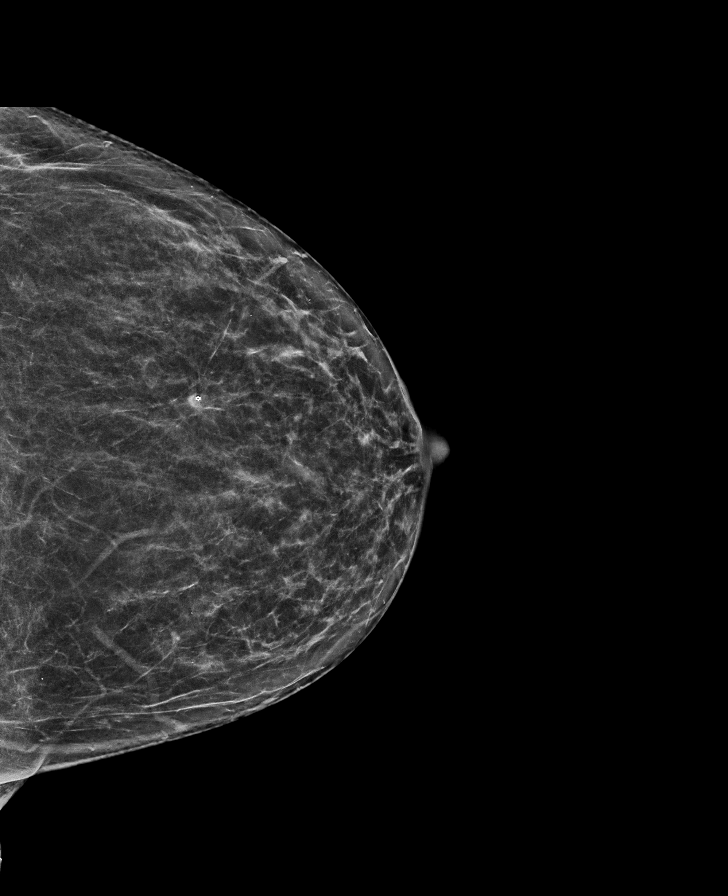

[R CC synth-2D]
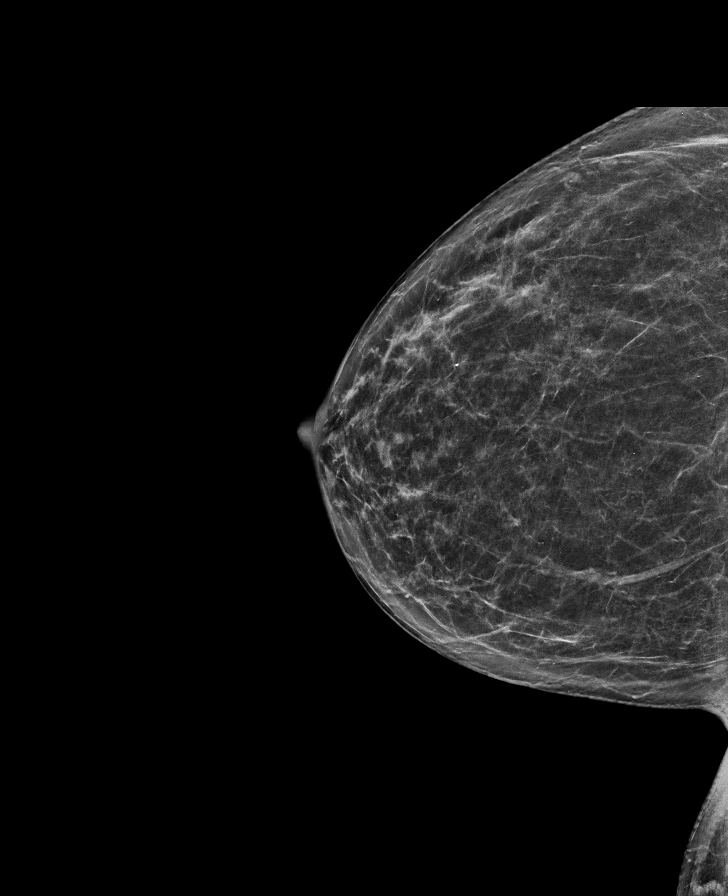

[L MLO tomo · tomo slice 41/80.0]
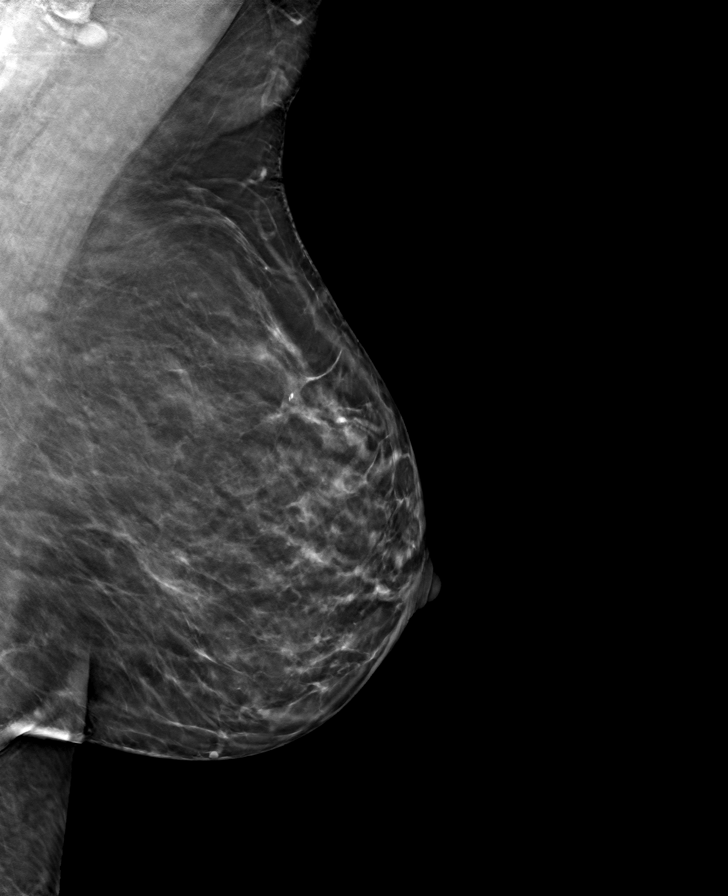

[R MLO tomo · tomo slice 36/71.0]
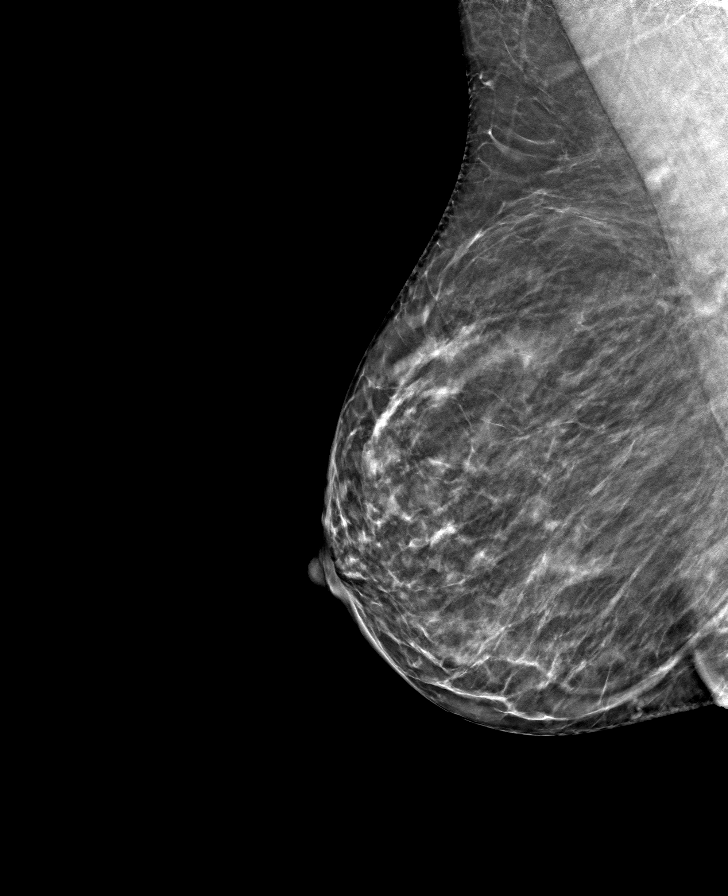

[R CC tomo · tomo slice 35/68.0]
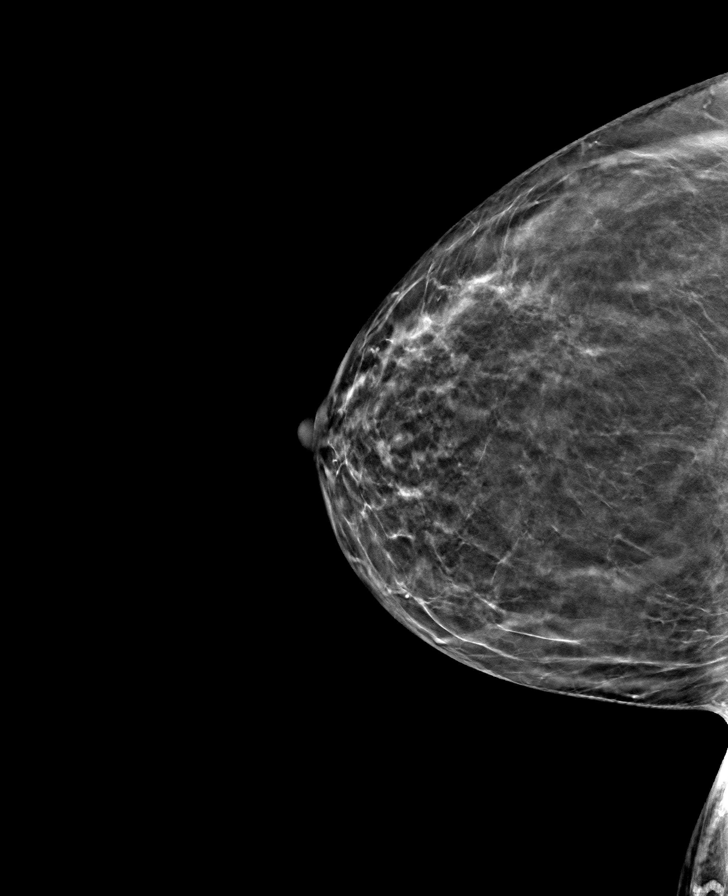

[L CC tomo · tomo slice 35/68.0]
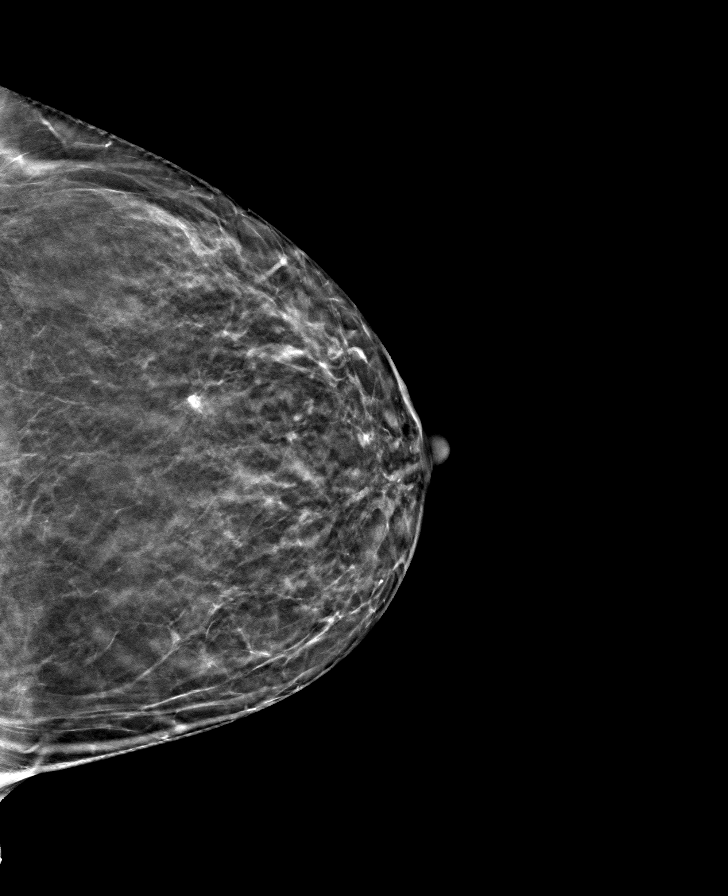

[8 of 24 positions shown; findings below may reference images not displayed]

ACR Breast Density Category c: The breast tissue is heterogeneously
dense, which may obscure small masses.
FINDINGS: There are no findings suspicious for malignancy.
IMPRESSION: No mammographic evidence of malignancy. A result letter of this
screening mammogram will be mailed directly to the patient.

RECOMMENDATION:
Screening mammogram in one year. (Code:Q3-W-BC3)

BI-RADS CATEGORY  1: Negative.

## 2023-04-19 ENCOUNTER — Other Ambulatory Visit (HOSPITAL_BASED_OUTPATIENT_CLINIC_OR_DEPARTMENT_OTHER): Payer: Self-pay | Admitting: *Deleted

## 2023-04-19 DIAGNOSIS — N951 Menopausal and female climacteric states: Secondary | ICD-10-CM

## 2023-04-19 MED ORDER — ESTRADIOL 0.5 MG PO TABS
0.5000 mg | ORAL_TABLET | Freq: Every day | ORAL | 0 refills | Status: DC
Start: 2023-04-19 — End: 2023-04-22

## 2023-04-19 NOTE — Progress Notes (Signed)
Pt called to request refill on estradiol. Pt reports that she is out. Pt has appt for annual exam on 12/26. Refill sent to pharmacy

## 2023-04-22 ENCOUNTER — Encounter (HOSPITAL_BASED_OUTPATIENT_CLINIC_OR_DEPARTMENT_OTHER): Payer: Self-pay | Admitting: Certified Nurse Midwife

## 2023-04-22 ENCOUNTER — Ambulatory Visit (HOSPITAL_BASED_OUTPATIENT_CLINIC_OR_DEPARTMENT_OTHER): Payer: PPO | Admitting: Certified Nurse Midwife

## 2023-04-22 VITALS — BP 123/88 | HR 73 | Ht 65.5 in | Wt 188.8 lb

## 2023-04-22 DIAGNOSIS — M8580 Other specified disorders of bone density and structure, unspecified site: Secondary | ICD-10-CM | POA: Diagnosis not present

## 2023-04-22 DIAGNOSIS — Z1231 Encounter for screening mammogram for malignant neoplasm of breast: Secondary | ICD-10-CM | POA: Diagnosis not present

## 2023-04-22 DIAGNOSIS — Z01419 Encounter for gynecological examination (general) (routine) without abnormal findings: Secondary | ICD-10-CM

## 2023-04-22 DIAGNOSIS — N951 Menopausal and female climacteric states: Secondary | ICD-10-CM | POA: Diagnosis not present

## 2023-04-22 DIAGNOSIS — Z79890 Hormone replacement therapy (postmenopausal): Secondary | ICD-10-CM | POA: Diagnosis not present

## 2023-04-22 MED ORDER — ESTRADIOL 0.5 MG PO TABS: 0.5000 mg | ORAL_TABLET | Freq: Every day | ORAL | 6 refills | Status: DC

## 2023-04-22 NOTE — Progress Notes (Signed)
GYNECOLOGY  VISIT  CC:   breast/pelvic exam  HPI: 67 y.o. G2P2 Married White or Caucasian female here for breast/pelvic exam and refill on Estradiol HT.   Past Medical History:  Diagnosis Date   Arthritis    Broken ankle 04/2014   will have surgery on 01/30/15-right ankle   Elevated hemoglobin A1c 01/17/2015   6.0   Endometriosis    Herpes simplex type 1 infection    Hypertension    Migraine    h/o migraines, none since hysterectomy   Situational depression    Varicose vein    surgery on right leg    MEDS:   Current Outpatient Medications on File Prior to Visit  Medication Sig Dispense Refill   acyclovir (ZOVIRAX) 400 MG tablet Take 1 tablet (400 mg total) by mouth 3 (three) times daily. 30 tablet 6   albuterol (VENTOLIN HFA) 108 (90 Base) MCG/ACT inhaler Inhale 2 puffs into the lungs every 4 (four) hours as needed for wheezing or shortness of breath. 18 g 0   ALPRAZolam (XANAX) 0.5 MG tablet TAKE 1/2 TO 1 TABLET(0.25 TO 0.5 MG) BY MOUTH AT BEDTIME AS NEEDED FOR SLEEP 30 tablet 3   aspirin 325 MG tablet Take 325 mg by mouth every other day.     benazepril (LOTENSIN) 40 MG tablet Take 1 tablet (40 mg total) by mouth daily. 90 tablet 3   CALCIUM PO Take by mouth daily.     citalopram (CELEXA) 40 MG tablet TAKE 1 TABLET(40 MG) BY MOUTH DAILY 90 tablet 0   Coenzyme Q10 (COQ-10 PO) Take by mouth.     KRILL OIL PO Take by mouth. Omega red krill oil     levocetirizine (XYZAL) 5 MG tablet Take 1 tablet (5 mg total) by mouth every evening. 90 tablet 3   meloxicam (MOBIC) 15 MG tablet TAKE 1 TABLET(15 MG) BY MOUTH DAILY AS NEEDED FOR ARTHRITIS PAIN 90 tablet 0   promethazine-dextromethorphan (PROMETHAZINE-DM) 6.25-15 MG/5ML syrup Take 5 mLs by mouth 4 (four) times daily as needed. 100 mL 0   Red Yeast Rice Extract (RED YEAST RICE PO) Take by mouth daily.     vitamin C (ASCORBIC ACID) 500 MG tablet Take 1,000 mg by mouth 3 (three) times daily.     No current facility-administered  medications on file prior to visit.    ALLERGIES: Betadine [povidone iodine], Codeine, and Other  SH:  lives with her husband, they have a female black lab (will have babies in the Spring), has 1 daughter who lives with her who has Spina Bifida, other daughter lives across the street and has 3 daughters. Tailer's parents were hit head-on and her Daddy passed away. She care for her Mom for 3 years before she passed away. Pt takes a low-dose of antidepressants which has helped her get through the "first" holidays without her Mom. She plans to wean slowly from the anti-depressant.  States she did well through the holidays.  Review of Systems  Constitutional: Negative.   HENT: Negative.    Eyes: Negative.   Respiratory: Negative.    Cardiovascular: Negative.   Gastrointestinal: Negative.   Genitourinary: Negative.        Occasional urinary stress incontinence-wears a pantiliner  Musculoskeletal: Negative.   Skin: Negative.        Sees Dermatology routinely  Neurological: Negative.   Endo/Heme/Allergies: Negative.   Psychiatric/Behavioral: Negative.     PHYSICAL EXAMINATION:    BP 123/88 (BP Location: Right Arm, Patient Position: Sitting, Cuff  Size: Large)   Pulse 73   Ht 5' 5.5" (1.664 m)   Wt 188 lb 12.8 oz (85.6 kg)   LMP 04/27/1992   BMI 30.94 kg/m     General appearance: alert, cooperative and appears stated age Neck: no adenopathy, supple, symmetrical, trachea midline and thyroid  Breasts: normal appearance, no masses or tenderness, Inspection negative, No nipple retraction or dimpling, No nipple discharge or bleeding, No axillary or supraclavicular adenopathy, Normal to palpation without dominant masses Abdomen: soft, non-tender; bowel sounds normal; no masses,  no organomegaly Lymph:  no inguinal LAD noted  Pelvic: External genitalia:  no lesions              Urethra:  normal appearing urethra with no masses, tenderness or lesions              Bartholins and Skenes: normal                  Bimanual Exam:  Uterus:  uterus absent              Adnexa: no mass, fullness, tenderness              Anus:  normal sphincter tone, no lesions  Chaperone, Hendricks Milo, CMA, was present for exam.  Assessment/Plan: 1. Menopausal syndrome on hormone replacement therapy - Pt desires to continue Estradiol therapy - estradiol (ESTRACE) 0.5 MG tablet; Take 1 tablet (0.5 mg total) by mouth daily.  Dispense: 90 tablet; Refill: 6  2. Osteopenia after menopause (Primary) - Plan repeat Bone Density September 2025 with routine screening mammogram - DG BONE DENSITY (DXA); Future  3. Encounter for screening mammogram for malignant neoplasm of breast - MM 3D SCREENING MAMMOGRAM BILATERAL BREAST; Future  RTO 2 years for Breast/pelvic exam and prn if issues arise. Letta Kocher

## 2023-04-23 ENCOUNTER — Ambulatory Visit (HOSPITAL_BASED_OUTPATIENT_CLINIC_OR_DEPARTMENT_OTHER): Payer: PPO | Admitting: Obstetrics & Gynecology

## 2023-04-23 DIAGNOSIS — N951 Menopausal and female climacteric states: Secondary | ICD-10-CM | POA: Insufficient documentation

## 2023-04-23 DIAGNOSIS — M8580 Other specified disorders of bone density and structure, unspecified site: Secondary | ICD-10-CM | POA: Insufficient documentation

## 2023-07-14 ENCOUNTER — Encounter: Payer: Self-pay | Admitting: Nurse Practitioner

## 2023-07-14 ENCOUNTER — Ambulatory Visit: Payer: PPO | Admitting: Nurse Practitioner

## 2023-07-14 VITALS — BP 122/82 | HR 76 | Wt 184.8 lb

## 2023-07-14 DIAGNOSIS — Z780 Asymptomatic menopausal state: Secondary | ICD-10-CM | POA: Diagnosis not present

## 2023-07-14 DIAGNOSIS — F418 Other specified anxiety disorders: Secondary | ICD-10-CM | POA: Diagnosis not present

## 2023-07-14 DIAGNOSIS — M8580 Other specified disorders of bone density and structure, unspecified site: Secondary | ICD-10-CM

## 2023-07-14 DIAGNOSIS — F4321 Adjustment disorder with depressed mood: Secondary | ICD-10-CM

## 2023-07-14 DIAGNOSIS — Z6830 Body mass index (BMI) 30.0-30.9, adult: Secondary | ICD-10-CM

## 2023-07-14 DIAGNOSIS — E559 Vitamin D deficiency, unspecified: Secondary | ICD-10-CM

## 2023-07-14 DIAGNOSIS — L853 Xerosis cutis: Secondary | ICD-10-CM | POA: Diagnosis not present

## 2023-07-14 DIAGNOSIS — F5102 Adjustment insomnia: Secondary | ICD-10-CM

## 2023-07-14 DIAGNOSIS — L609 Nail disorder, unspecified: Secondary | ICD-10-CM | POA: Diagnosis not present

## 2023-07-14 DIAGNOSIS — M25561 Pain in right knee: Secondary | ICD-10-CM | POA: Diagnosis not present

## 2023-07-14 DIAGNOSIS — I10 Essential (primary) hypertension: Secondary | ICD-10-CM

## 2023-07-14 DIAGNOSIS — M19072 Primary osteoarthritis, left ankle and foot: Secondary | ICD-10-CM

## 2023-07-14 DIAGNOSIS — R7303 Prediabetes: Secondary | ICD-10-CM

## 2023-07-14 DIAGNOSIS — J301 Allergic rhinitis due to pollen: Secondary | ICD-10-CM

## 2023-07-14 DIAGNOSIS — R7989 Other specified abnormal findings of blood chemistry: Secondary | ICD-10-CM

## 2023-07-14 DIAGNOSIS — E785 Hyperlipidemia, unspecified: Secondary | ICD-10-CM | POA: Diagnosis not present

## 2023-07-14 DIAGNOSIS — G8929 Other chronic pain: Secondary | ICD-10-CM | POA: Insufficient documentation

## 2023-07-14 MED ORDER — ALBUTEROL SULFATE HFA 108 (90 BASE) MCG/ACT IN AERS: 2.0000 | INHALATION_SPRAY | RESPIRATORY_TRACT | 2 refills | Status: AC | PRN

## 2023-07-14 MED ORDER — CITALOPRAM HYDROBROMIDE 20 MG PO TABS: 20.0000 mg | ORAL_TABLET | ORAL | 3 refills | Status: DC

## 2023-07-14 MED ORDER — ALPRAZOLAM 0.5 MG PO TABS
ORAL_TABLET | ORAL | 3 refills | Status: DC
Start: 2023-07-14 — End: 2024-02-02

## 2023-07-14 NOTE — Assessment & Plan Note (Signed)
 Depression managed with citalopram, currently taking 20 mg every other day, effectively maintaining mood stability. - Prescribe 20 mg citalopram to be taken every other day.

## 2023-07-14 NOTE — Assessment & Plan Note (Signed)
 Repeat lab evaluation for monitoring. No changes in plan at this time.

## 2023-07-14 NOTE — Assessment & Plan Note (Signed)
 Chronic. Currently controlled with diet. No alarm symptoms are present at this time. We did discuss her concerns with weight gain and that this could be linked to her blood sugar control. Labs ordered for today.  Plan: - Continue with strict diet of low carb and increased protein - Begin using the elliptical again as tolerated with arthritis - Will make additional changes based on labs.  - F/U in 6 months or sooner pending labs

## 2023-07-14 NOTE — Assessment & Plan Note (Signed)
 Chronic allergic rhinitis with exacerbations during cedar and juniper pollen season, causing sinus congestion and increased inhaler use. Sinus headache present today. Levocetirizine is less effective during high pollen periods, with fexofenadine providing better relief. - Continue fexofenadine during high pollen season. - Refill albuterol inhaler for use as needed for respiratory symptoms. - Advise wearing a mask outdoors to reduce allergen exposure.

## 2023-07-14 NOTE — Progress Notes (Addendum)
 Shawna Clamp, DNP, AGNP-c [facility]  [address] [place], [place] [address] [phone]  ESTABLISHED PATIENT- Chronic Health and/or Follow-Up Visit  Blood pressure 122/82, pulse 76, weight 184 lb 12.8 oz (83.8 kg), last menstrual period 04/27/1992.    [name] is a 68 y.o. year old female presenting today for evaluation and management of chronic conditions.   Last visit: 6 months ago Goals: Working on diet and exercise for weight loss.  Cutting down on citalopram.  Medicare Wellness Goal: Elliptical training  History of Present Illness [name] is a 68 year old female who presents for routine follow-up.   She experiences severe arthritis pain in her left ankle and right knee, significantly limiting her ability to exercise. The pain is sometimes unbearable, particularly in the left ankle, which she describes as 'bone on bone' in some areas. She manages about a mile on the elliptical, but any more exacerbates the pain, affecting her sleep. She uses meloxicam for pain management but does not require a refill at this time.  She has a history of sinus issues, particularly during allergy season, which exacerbates her headaches. She uses fexofenadine for allergy relief, as levocetirizine is less effective during high pollen seasons. She also uses an albuterol inhaler as needed for respiratory symptoms, especially when exposed to cedar and juniper pollen. No dizziness or other headaches aside from sinus-related ones.  She has been experiencing changes in her nails, which have become brittle and fracture easily, as well as thinning eyebrows and dry skin. She suspects these symptoms might be related to thyroid issues, as her daughter suggested. She has not had thyroid function tests since 2020. She reports fatigue and takes a B vitamin supplement daily.  She is currently on a low-dose estrogen therapy and has been trying to manage her weight through diet and  exercise, though arthritis pain limits her physical activity. She follows a low-carb diet but has not seen significant weight loss. She takes red yeast rice, krill oil, and CoQ10 supplements.  She is managing depression with citalopram, currently at a reduced dose of 20 mg every other day, and reports that this regimen is helping her manage her mood. She has not used alprazolam recently but finds it helpful for occasional anxiety and sleep issues.  She is currently on amoxicillin for a dental abscess and has an upcoming appointment with an endodontist to assess the need for a root canal or extraction.  All ROS negative with exception of what is listed above.   PHYSICAL EXAM Physical Exam Vitals and nursing note reviewed.  Constitutional:      General: She is not in acute distress.    Appearance: Normal appearance. She is not ill-appearing.  HENT:     Head: Normocephalic.  Eyes:     Conjunctiva/sclera: Conjunctivae normal.  Neck:     Vascular: No carotid bruit.  Cardiovascular:     Rate and Rhythm: Normal rate and regular rhythm.     Pulses: Normal pulses.     Heart sounds: Normal heart sounds.  Pulmonary:     Effort: Pulmonary effort is normal.     Breath sounds: Normal breath sounds.  Musculoskeletal:        General: Swelling and tenderness present.     Cervical back: Normal range of motion and neck supple. No tenderness.     Right lower leg: No edema.     Left lower leg: No edema.     Comments: Mild left foot and ankle  swelling Left knee tenderness   Lymphadenopathy:     Cervical: No cervical adenopathy.  Skin:    General: Skin is warm and dry.     Capillary Refill: Capillary refill takes less than 2 seconds.  Neurological:     Mental Status: She is alert and oriented to person, place, and time.     Sensory: No sensory deficit.     Motor: No weakness.     Coordination: Coordination normal.  Psychiatric:        Mood and Affect: Mood normal.        Behavior: Behavior  normal.      PLAN Problem List Items Addressed This Visit     Hypertension   Chronic. BP stable No alarm symptoms present at this time.  taking as prescribed. Goal blood pressure less than less than 130/80. Currently is not followed with cardiology.  Plan: current treatment plan is effective, no change in therapy, orders and follow up as documented in EpicCare.  Refills not needed today Labs today to check electrolytes and kidney function.  Will make changes to plan of care as necessary based on lab results.  Plan to follow-up in 6months.       Relevant Orders   Hemoglobin A1c   CBC with Differential/Platelet   Comprehensive metabolic panel   Vitamin D deficiency   Repeat lab evaluation for monitoring. No changes in plan at this time.       Relevant Orders   VITAMIN D 25 Hydroxy (Vit-D Deficiency, Fractures)   Allergic rhinitis   Chronic allergic rhinitis with exacerbations during cedar and juniper pollen season, causing sinus congestion and increased inhaler use. Sinus headache present today. Levocetirizine is less effective during high pollen periods, with fexofenadine providing better relief. - Continue fexofenadine during high pollen season. - Refill albuterol inhaler for use as needed for respiratory symptoms. - Advise wearing a mask outdoors to reduce allergen exposure.      Relevant Medications   albuterol (VENTOLIN HFA) 108 (90 Base) MCG/ACT inhaler   Hyperlipidemia   Chronic. Lipids monitored today. No alarm symptoms present at this time. LDL Goal < 100. Controlled with  red yeast rice and krill oil .  Plan: Continue current lipid-lowering therapy. Labs due at next visit Recommend Treatment of hypertension  and Strict diet and exercise Diet and exercise recommendations provided.  Follow-up in 6months         Relevant Orders   Hemoglobin A1c   Comprehensive metabolic panel   Insomnia   Chronic insomnia with difficulty sleeping, possibly exacerbated by pain  and anxiety. Previous use of alprazolam for anxiety and sleep issues. Herbal remedies have caused adverse effects such as vivid dreams. - Refill alprazolam for use as needed for sleep.      Relevant Medications   ALPRAZolam (XANAX) 0.5 MG tablet   Other Relevant Orders   TSH   Situational depression   Depression managed with citalopram, currently taking 20 mg every other day, effectively maintaining mood stability. - Prescribe 20 mg citalopram to be taken every other day.      Relevant Medications   citalopram (CELEXA) 20 MG tablet   ALPRAZolam (XANAX) 0.5 MG tablet   Prediabetes - Primary   Chronic. Currently controlled with diet. No alarm symptoms are present at this time. We did discuss her concerns with weight gain and that this could be linked to her blood sugar control. Labs ordered for today.  Plan: - Continue with strict diet of low carb and  increased protein - Begin using the elliptical again as tolerated with arthritis - Will make additional changes based on labs.  - F/U in 6 months or sooner pending labs      Relevant Orders   Hemoglobin A1c   CBC with Differential/Platelet   Comprehensive metabolic panel   Situational anxiety   See depression      Relevant Medications   citalopram (CELEXA) 20 MG tablet   ALPRAZolam (XANAX) 0.5 MG tablet   BMI 30.0-30.9,adult   Chronic weight gain. We discussed diet and exercise. Will monitor labs today to ensure that there are not any conditions contributing. Continue with elliptical and diet changes.  Plan: - Begin work out regimen again as tolerated by arthritis - Will make additional changes as necessary based on labs.       Relevant Orders   Hemoglobin A1c   CBC with Differential/Platelet   Comprehensive metabolic panel   Osteopenia after menopause   Labs monitored today. Continue vitamin D and calcium supplementation and estrogen therapy as recommended by GYN.       Relevant Orders   Comprehensive metabolic panel    VITAMIN D 25 Hydroxy (Vit-D Deficiency, Fractures)   Chronic pain of right knee   Suspected arthritis. See arthritis of ankle.       Arthritis of left ankle   Chronic osteoarthritis affecting the left ankle and right knee, with severe pain and functional limitations. The left ankle is described as bone-on-bone in some areas, causing significant discomfort and swelling, especially at night. The right knee experiences intermittent pain, likely exacerbated by barometric pressure changes. Gel injections in the left ankle have been discussed as a potential treatment, with anecdotal evidence from her husband suggesting significant relief. - Consider referral to Dr. Loreta Ave for evaluation of left ankle and right knee. - Discuss potential for gel injections in the left ankle to alleviate pain and improve function. - Continue use of meloxicam as needed for pain management. - Use Voltaren gel and ibuprofen for symptomatic relief.      Dry skin   Symptoms suggestive of hypothyroidism include brittle nails, thinning eyebrows, dry skin, and fatigue. Last thyroid function test was in 2020. Differential diagnosis includes hormonal changes or low B12 levels. - Order thyroid function tests to evaluate for hypothyroidism. - Check B12 levels as part of the evaluation.      Relevant Orders   VITAMIN D 25 Hydroxy (Vit-D Deficiency, Fractures)   TSH   Vitamin B12   Nail abnormality   Symptoms suggestive of hypothyroidism include brittle nails, thinning eyebrows, dry skin, and fatigue. Last thyroid function test was in 2020. Differential diagnosis includes hormonal changes or low B12 levels. - Order thyroid function tests to evaluate for hypothyroidism. - Check B12 levels as part of the evaluation.      Relevant Orders   VITAMIN D 25 Hydroxy (Vit-D Deficiency, Fractures)   TSH   Vitamin B12  ADDENDUM: TSH low. Will add repeat TSH, FT4, FT3, and antibodies for further evaluation.   Return in about 6  months (around 01/14/2024) for AWV + CPE.  Shawna Clamp, DNP, AGNP-c

## 2023-07-14 NOTE — Assessment & Plan Note (Signed)
 Chronic. Lipids monitored today. No alarm symptoms present at this time. LDL Goal < 100. Controlled with  red yeast rice and krill oil .  Plan: Continue current lipid-lowering therapy. Labs due at next visit Recommend Treatment of hypertension  and Strict diet and exercise Diet and exercise recommendations provided.  Follow-up in 6months

## 2023-07-14 NOTE — Assessment & Plan Note (Signed)
 Chronic weight gain. We discussed diet and exercise. Will monitor labs today to ensure that there are not any conditions contributing. Continue with elliptical and diet changes.  Plan: - Begin work out regimen again as tolerated by arthritis - Will make additional changes as necessary based on labs.

## 2023-07-14 NOTE — Assessment & Plan Note (Signed)
 See depression

## 2023-07-14 NOTE — Assessment & Plan Note (Signed)
 Chronic osteoarthritis affecting the left ankle and right knee, with severe pain and functional limitations. The left ankle is described as bone-on-bone in some areas, causing significant discomfort and swelling, especially at night. The right knee experiences intermittent pain, likely exacerbated by barometric pressure changes. Gel injections in the left ankle have been discussed as a potential treatment, with anecdotal evidence from her husband suggesting significant relief. - Consider referral to Dr. Loreta Ave for evaluation of left ankle and right knee. - Discuss potential for gel injections in the left ankle to alleviate pain and improve function. - Continue use of meloxicam as needed for pain management. - Use Voltaren gel and ibuprofen for symptomatic relief.

## 2023-07-14 NOTE — Assessment & Plan Note (Signed)
 Chronic insomnia with difficulty sleeping, possibly exacerbated by pain and anxiety. Previous use of alprazolam for anxiety and sleep issues. Herbal remedies have caused adverse effects such as vivid dreams. - Refill alprazolam for use as needed for sleep.

## 2023-07-14 NOTE — Assessment & Plan Note (Signed)
 Symptoms suggestive of hypothyroidism include brittle nails, thinning eyebrows, dry skin, and fatigue. Last thyroid function test was in 2020. Differential diagnosis includes hormonal changes or low B12 levels. - Order thyroid function tests to evaluate for hypothyroidism. - Check B12 levels as part of the evaluation.

## 2023-07-14 NOTE — Assessment & Plan Note (Signed)
 Labs monitored today. Continue vitamin D and calcium supplementation and estrogen therapy as recommended by GYN.

## 2023-07-14 NOTE — Assessment & Plan Note (Signed)
 Suspected arthritis. See arthritis of ankle.

## 2023-07-14 NOTE — Patient Instructions (Addendum)
 Next Visit: 6 months for Annual Wellness Visit and Medication Management  Goals discussed today: Keep working on diet and exercise as you can We will see if there are any abnormalities that could be contributing to your nail changes.   Medication changes today: We will continue your citalopram at 20mg  every other day. I have sent that in for you.   I sent in refills of the alprazolam and the albuterol  Labs today: Vitamin D Kidneys, liver, electrolytes, proteins 3 month blood sugar Red and white blood counts Thyroid B12  We will plan to check your cholesterol at your next visit.

## 2023-07-14 NOTE — Assessment & Plan Note (Signed)
 Chronic. BP stable No alarm symptoms present at this time.  taking as prescribed. Goal blood pressure less than less than 130/80. Currently is not followed with cardiology.  Plan: current treatment plan is effective, no change in therapy, orders and follow up as documented in EpicCare.  Refills not needed today Labs today to check electrolytes and kidney function.  Will make changes to plan of care as necessary based on lab results.  Plan to follow-up in 6months.

## 2023-07-15 LAB — COMPREHENSIVE METABOLIC PANEL
ALT: 9 IU/L (ref 0–32)
AST: 14 IU/L (ref 0–40)
Albumin: 4.2 g/dL (ref 3.9–4.9)
Alkaline Phosphatase: 57 IU/L (ref 44–121)
BUN/Creatinine Ratio: 32 — ABNORMAL HIGH (ref 12–28)
BUN: 19 mg/dL (ref 8–27)
Bilirubin Total: 0.3 mg/dL (ref 0.0–1.2)
CO2: 22 mmol/L (ref 20–29)
Calcium: 9.1 mg/dL (ref 8.7–10.3)
Chloride: 105 mmol/L (ref 96–106)
Creatinine, Ser: 0.6 mg/dL (ref 0.57–1.00)
Globulin, Total: 2.2 g/dL (ref 1.5–4.5)
Glucose: 98 mg/dL (ref 70–99)
Potassium: 4.2 mmol/L (ref 3.5–5.2)
Sodium: 140 mmol/L (ref 134–144)
Total Protein: 6.4 g/dL (ref 6.0–8.5)
eGFR: 99 mL/min/{1.73_m2} (ref >59)

## 2023-07-15 LAB — CBC WITH DIFFERENTIAL/PLATELET
Basophils Absolute: 0.1 10*3/uL (ref 0.0–0.2)
Basos: 1 %
EOS (ABSOLUTE): 0.1 10*3/uL (ref 0.0–0.4)
Eos: 2 %
Hematocrit: 41.2 % (ref 34.0–46.6)
Hemoglobin: 13.3 g/dL (ref 11.1–15.9)
Immature Grans (Abs): 0 10*3/uL (ref 0.0–0.1)
Immature Granulocytes: 0 %
Lymphocytes Absolute: 1.2 10*3/uL (ref 0.7–3.1)
Lymphs: 26 %
MCH: 26.9 pg (ref 26.6–33.0)
MCHC: 32.3 g/dL (ref 31.5–35.7)
MCV: 83 fL (ref 79–97)
Monocytes Absolute: 0.5 10*3/uL (ref 0.1–0.9)
Monocytes: 11 %
Neutrophils Absolute: 2.7 10*3/uL (ref 1.4–7.0)
Neutrophils: 60 %
Platelets: 235 10*3/uL (ref 150–450)
RBC: 4.94 x10E6/uL (ref 3.77–5.28)
RDW: 13.6 % (ref 11.7–15.4)
WBC: 4.4 10*3/uL (ref 3.4–10.8)

## 2023-07-15 LAB — VITAMIN D 25 HYDROXY (VIT D DEFICIENCY, FRACTURES): Vit D, 25-Hydroxy: 35.4 ng/mL (ref 30.0–100.0)

## 2023-07-15 LAB — TSH: TSH: 0.302 u[IU]/mL — ABNORMAL LOW (ref 0.450–4.500)

## 2023-07-15 LAB — VITAMIN B12: Vitamin B-12: 541 pg/mL (ref 232–1245)

## 2023-07-15 LAB — HEMOGLOBIN A1C
Est. average glucose Bld gHb Est-mCnc: 128 mg/dL
Hgb A1c MFr Bld: 6.1 % — ABNORMAL HIGH (ref 4.8–5.6)

## 2023-07-16 ENCOUNTER — Other Ambulatory Visit (HOSPITAL_BASED_OUTPATIENT_CLINIC_OR_DEPARTMENT_OTHER): Payer: Self-pay | Admitting: Obstetrics & Gynecology

## 2023-07-16 DIAGNOSIS — N951 Menopausal and female climacteric states: Secondary | ICD-10-CM

## 2023-07-22 ENCOUNTER — Ambulatory Visit (HOSPITAL_BASED_OUTPATIENT_CLINIC_OR_DEPARTMENT_OTHER): Payer: PPO | Admitting: Certified Nurse Midwife

## 2023-07-23 ENCOUNTER — Telehealth: Payer: Self-pay | Admitting: Nurse Practitioner

## 2023-07-23 ENCOUNTER — Encounter: Payer: Self-pay | Admitting: Nurse Practitioner

## 2023-07-23 NOTE — Telephone Encounter (Signed)
 I see [name] wants her to come back for labs but does it matter when like one week or months?

## 2023-07-23 NOTE — Addendum Note (Signed)
 Addended by: Sonji Starkes, Huntley Dec E on: 07/23/2023 07:32 AM   Modules accepted: Orders

## 2023-07-26 ENCOUNTER — Other Ambulatory Visit

## 2023-07-26 DIAGNOSIS — R7989 Other specified abnormal findings of blood chemistry: Secondary | ICD-10-CM

## 2023-07-27 LAB — THYROID CASCADE PROFILE: TSH: 0.285 u[IU]/mL — ABNORMAL LOW (ref 0.450–4.500)

## 2023-07-27 LAB — THYROID STIMULATING IMMUNOGLOBULIN: Thyroid Stim Immunoglobulin: <0.1 IU/L (ref 0.00–0.55)

## 2023-07-27 LAB — THYROXINE (T4) FREE, DIRECT: T4,Free (Direct): 0.89 ng/dL (ref 0.82–1.77)

## 2023-07-27 LAB — T3FREE: Triiodothyronine, Free, Serum: 2.8 pg/mL (ref 2.0–4.4)

## 2023-07-27 LAB — THYROID PEROXIDASE ANTIBODY: Thyroperoxidase Ab SerPl-aCnc: 12 [IU]/mL (ref 0–34)

## 2023-07-29 ENCOUNTER — Other Ambulatory Visit

## 2023-08-04 ENCOUNTER — Encounter: Payer: Self-pay | Admitting: Nurse Practitioner

## 2023-08-05 ENCOUNTER — Other Ambulatory Visit: Payer: Self-pay

## 2023-08-05 DIAGNOSIS — R7989 Other specified abnormal findings of blood chemistry: Secondary | ICD-10-CM

## 2023-08-14 ENCOUNTER — Other Ambulatory Visit (HOSPITAL_BASED_OUTPATIENT_CLINIC_OR_DEPARTMENT_OTHER): Payer: Self-pay | Admitting: Nurse Practitioner

## 2023-08-14 DIAGNOSIS — B001 Herpesviral vesicular dermatitis: Secondary | ICD-10-CM

## 2023-08-16 NOTE — Telephone Encounter (Signed)
 Last apt 07/14/23

## 2023-10-27 ENCOUNTER — Ambulatory Visit: Admitting: "Endocrinology

## 2023-10-27 ENCOUNTER — Encounter: Payer: Self-pay | Admitting: "Endocrinology

## 2023-10-27 VITALS — BP 140/80 | HR 82 | Ht 65.5 in | Wt 187.0 lb

## 2023-10-27 DIAGNOSIS — Z8349 Family history of other endocrine, nutritional and metabolic diseases: Secondary | ICD-10-CM

## 2023-10-27 DIAGNOSIS — E0590 Thyrotoxicosis, unspecified without thyrotoxic crisis or storm: Secondary | ICD-10-CM | POA: Diagnosis not present

## 2023-10-27 NOTE — Progress Notes (Signed)
 Outpatient Endocrinology Note Amy Birmingham, MD  10/27/23   [name] [birth year] [id number]  Referring Provider: Oris Camie BRAVO, NP Primary Care Provider: Oris Camie BRAVO, NP Subjective  No chief complaint on file.   Assessment & Plan  Diagnoses and all orders for this visit:  Subclinical hyperthyroidism -     TSH + free T4  Family history of thyroid  disease in mother -     US  THYROID ; Future -     US  THYROID     [name] is currently not taking any thyroid  medication. 06/2023  TSH was 0.3 (baseline TSH 0.3-0.8).   Thyroid  disease runs in mother and sister. Patient was last biochemically subclinically hyperthyroid.  Educated on thyroid  axis.  Recommend the following: repeat labs today. Repeat lab before next visit or sooner if symptoms of hyperthyroidism or hypothyroidism develop.  Notify us  immediately in case of significant weight gain or loss. Counseled on compliance and follow up needs.  Thyroid  disease runs in mother and sister. Patient reports sometimes feels like something is pressing on throat  Ordered baseline thyroid  ultrasound    I have reviewed current medications, nurse's notes, allergies, vital signs, past medical and surgical history, family medical history, and social history for this encounter. Counseled patient on symptoms, examination findings, lab findings, imaging results, treatment decisions and monitoring and prognosis. The patient understood the recommendations and agrees with the treatment plan. All questions regarding treatment plan were fully answered.   Return in about 4 weeks (around 11/24/2023) for visit, labs today.   Amy Birmingham, MD  10/27/23   I have reviewed current medications, nurse's notes, allergies, vital signs, past medical and surgical history, family medical history, and social history for this encounter. Counseled patient on symptoms, examination findings, lab findings, imaging results, treatment decisions and  monitoring and prognosis. The patient understood the recommendations and agrees with the treatment plan. All questions regarding treatment plan were fully answered.   History of Present Illness [name] is a 68 y.o. year old female who presents to our clinic with subclinical hyperthyroidism diagnosed in 06/2023 with TSH of 0.3 (baseline TSH 0.3-0.8).    Never been on thyroid  medication Parents hit by drunk driver, father passed away, mother taken care of by patient for 3 yrs until mother passed away. Patient reports going downhill since her mother's passing.   Symptoms suggestive of HYPOTHYROIDISM:  fatigue Yes weight gain Yes cold intolerance  No constipation  Yes  Symptoms suggestive of HYPERTHYROIDISM:  weight loss  No heat intolerance No hyperdefecation  No palpitations  No anxiety yes, has anxiety meds  Compressive symptoms:  dysphagia  No, sometimes feels like something is pressing on throat  dysphonia  No positional dyspnea (especially with simultaneous arms elevation)  No  Smokes  No On biotin  No Personal history of head/neck surgery/irradiation  No  Physical Exam  BP (!) 140/80   Pulse 82   Ht 5' 5.5 (1.664 m)   Wt 187 lb (84.8 kg)   LMP 04/27/1992   SpO2 98%   BMI 30.65 kg/m  Constitutional: well developed, well nourished Head: normocephalic, atraumatic, no exophthalmos Eyes: sclera anicteric, no redness Neck: no thyromegaly, no thyroid  tenderness; + nodules palpated Lungs: normal respiratory effort Neurology: alert and oriented, no fine hand tremor, Pemberton's sign -ve Skin: dry, no appreciable rashes Musculoskeletal: no appreciable defects Psychiatric: normal mood and affect  Allergies Allergies  Allergen Reactions   Betadine [Povidone Iodine] Itching   Codeine  Other     Band aids-itchy, red rash    Current Medications Patient's Medications  New Prescriptions   No medications on file  Previous Medications   ACYCLOVIR  (ZOVIRAX ) 400  MG TABLET    TAKE 1 TABLET(400 MG) BY MOUTH THREE TIMES DAILY   ALBUTEROL  (VENTOLIN  HFA) 108 (90 BASE) MCG/ACT INHALER    Inhale 2 puffs into the lungs every 4 (four) hours as needed for wheezing or shortness of breath.   ALPRAZOLAM  (XANAX ) 0.5 MG TABLET    TAKE 1/2 TO 1 TABLET(0.25 TO 0.5 MG) BY MOUTH AT BEDTIME AS NEEDED FOR SLEEP   ASPIRIN  325 MG TABLET    Take 325 mg by mouth every other day.   BENAZEPRIL  (LOTENSIN ) 40 MG TABLET    Take 1 tablet (40 mg total) by mouth daily.   CALCIUM  PO    Take by mouth daily.   CITALOPRAM  (CELEXA ) 20 MG TABLET    Take 1 tablet (20 mg total) by mouth every other day.   COENZYME Q10 (COQ-10 PO)    Take by mouth.   ESTRADIOL  (ESTRACE ) 0.5 MG TABLET    Take 1 tablet (0.5 mg total) by mouth daily.   KRILL OIL PO    Take by mouth. Omega red krill oil   LEVOCETIRIZINE (XYZAL ) 5 MG TABLET    Take 1 tablet (5 mg total) by mouth every evening.   MELOXICAM  (MOBIC ) 15 MG TABLET    TAKE 1 TABLET(15 MG) BY MOUTH DAILY AS NEEDED FOR ARTHRITIS PAIN   RED YEAST RICE EXTRACT (RED YEAST RICE PO)    Take by mouth daily.   VITAMIN C (ASCORBIC ACID) 500 MG TABLET    Take 1,000 mg by mouth 3 (three) times daily.  Modified Medications   No medications on file  Discontinued Medications   No medications on file    Past Medical History Past Medical History:  Diagnosis Date   Arthritis    Broken ankle 04/2014   will have surgery on 01/30/15-right ankle   COVID-19 11/24/2021   Elevated hemoglobin A1c 01/17/2015   6.0   Endometriosis    Herpes simplex type 1 infection    Hypertension    Migraine    h/o migraines, none since hysterectomy   Situational depression    Varicose vein    surgery on right leg    Past Surgical History Past Surgical History:  Procedure Laterality Date   ABDOMINAL HYSTERECTOMY  1994   LAVH/RSO   ANKLE SURGERY Right 01/30/2015   BREAST BIOPSY Left    CESAREAN SECTION     CHOLECYSTECTOMY     COLON RESECTION  11/06   and lap LSO    CYSTECTOMY     HERNIA REPAIR  1982   HERNIA REPAIR  2003   and bladder repair   SHOULDER SURGERY Right 04/2017   Tendon repair     Family History family history includes Diabetes in her mother and sister; Hypertension in her mother and sister; Lung cancer in her father; Skin cancer in an other family member; Spina bifida in her daughter; Thyroid  nodules in her mother and sister.  Social History Social History   Socioeconomic History   Marital status: Married    Spouse name: Not on file   Number of children: 2   Years of education: Not on file   Highest education level: High school graduate  Occupational History   Not on file  Tobacco Use   Smoking status: Never    Passive  exposure: Never   Smokeless tobacco: Never  Vaping Use   Vaping status: Never Used  Substance and Sexual Activity   Alcohol use: Yes    Alcohol/week: 2.0 - 3.0 standard drinks of alcohol    Types: 2 - 3 Glasses of wine per week    Comment: Occassionally   Drug use: No   Sexual activity: Yes    Partners: Male    Birth control/protection: Surgical    Comment: LAVH/RSO, then LSO  Other Topics Concern   Not on file  Social History Narrative   She has two daughters. One daughter lives across the street with her family. Her oldest daughter lives at home with her, she has spina bifida.    Social Drivers of Corporate investment banker Strain: Low Risk  (01/05/2023)   Overall Financial Resource Strain (CARDIA)    Difficulty of Paying Living Expenses: Not very hard  Food Insecurity: No Food Insecurity (01/05/2023)   Hunger Vital Sign    Worried About Running Out of Food in the Last Year: Never true    Ran Out of Food in the Last Year: Never true  Transportation Needs: No Transportation Needs (01/05/2023)   PRAPARE - Administrator, Civil Service (Medical): No    Lack of Transportation (Non-Medical): No  Physical Activity: Insufficiently Active (01/05/2023)   Exercise Vital Sign    Days of  Exercise per Week: 2 days    Minutes of Exercise per Session: 30 min  Stress: No Stress Concern Present (01/05/2023)   [employer] of Occupational Health - Occupational Stress Questionnaire    Feeling of Stress : Only a little  Social Connections: Socially Integrated (01/05/2023)   Social Connection and Isolation Panel    Frequency of Communication with Friends and Family: More than three times a week    Frequency of Social Gatherings with Friends and Family: Twice a week    Attends Religious Services: More than 4 times per year    Active Member of Clubs or Organizations: Yes    Attends Banker Meetings: 1 to 4 times per year    Marital Status: Married  Catering manager Violence: Not At Risk (01/05/2023)   Humiliation, Afraid, Rape, and Kick questionnaire    Fear of Current or Ex-Partner: No    Emotionally Abused: No    Physically Abused: No    Sexually Abused: No    Laboratory Investigations Lab Results  Component Value Date   TSH 0.285 (L) 07/26/2023   TSH 0.302 (L) 07/14/2023   TSH 0.59 10/19/2018     No results found for: TSI   No components found for: TRAB   Lab Results  Component Value Date   CHOL 256 (H) 01/05/2023   Lab Results  Component Value Date   HDL 77 01/05/2023   Lab Results  Component Value Date   LDLCALC 163 (H) 01/05/2023   Lab Results  Component Value Date   TRIG 92 01/05/2023   Lab Results  Component Value Date   CHOLHDL 3.3 01/05/2023   Lab Results  Component Value Date   CREATININE 0.60 07/14/2023   No results found for: GFR    Component Value Date/Time   NA 140 07/14/2023 0909   K 4.2 07/14/2023 0909   CL 105 07/14/2023 0909   CO2 22 07/14/2023 0909   GLUCOSE 98 07/14/2023 0909   GLUCOSE 98 03/31/2021 1026   BUN 19 07/14/2023 0909   CREATININE 0.60 07/14/2023 0909   CREATININE  0.61 03/31/2021 1026   CALCIUM  9.1 07/14/2023 0909   PROT 6.4 07/14/2023 0909   ALBUMIN 4.2 07/14/2023 0909   AST 14 07/14/2023  0909   ALT 9 07/14/2023 0909   ALKPHOS 57 07/14/2023 0909   BILITOT 0.3 07/14/2023 0909   GFRNONAA 94 09/23/2017 1421   GFRAA 109 09/23/2017 1421      Latest Ref Rng & Units 07/14/2023    9:09 AM 01/05/2023    9:19 AM 06/30/2022    9:12 AM  BMP  Glucose 70 - 99 mg/dL 98  896  78   BUN 8 - 27 mg/dL 19  18  16    Creatinine 0.57 - 1.00 mg/dL 9.39  9.40  9.37   BUN/Creat Ratio 12 - 28 32  31  26   Sodium 134 - 144 mmol/L 140  140  139   Potassium 3.5 - 5.2 mmol/L 4.2  4.4  4.4   Chloride 96 - 106 mmol/L 105  103  101   CO2 20 - 29 mmol/L 22  25  23    Calcium  8.7 - 10.3 mg/dL 9.1  9.0  9.3        Component Value Date/Time   WBC 4.4 07/14/2023 0909   WBC 5.4 03/31/2021 1026   RBC 4.94 07/14/2023 0909   RBC 4.85 03/31/2021 1026   HGB 13.3 07/14/2023 0909   HGB 14.6 12/21/2013 0929   HCT 41.2 07/14/2023 0909   PLT 235 07/14/2023 0909   MCV 83 07/14/2023 0909   MCH 26.9 07/14/2023 0909   MCH 27.4 03/31/2021 1026   MCHC 32.3 07/14/2023 0909   MCHC 32.4 03/31/2021 1026   RDW 13.6 07/14/2023 0909   LYMPHSABS 1.2 07/14/2023 0909   EOSABS 0.1 07/14/2023 0909   BASOSABS 0.1 07/14/2023 0909      Parts of this note may have been dictated using voice recognition software. There may be variances in spelling and vocabulary which are unintentional. Not all errors are proofread. Please notify the dino if any discrepancies are noted or if the meaning of any statement is not clear.

## 2023-10-28 ENCOUNTER — Encounter: Payer: Self-pay | Admitting: "Endocrinology

## 2023-10-28 LAB — TSH+FREE T4: TSH W/REFLEX TO FT4: 0.32 m[IU]/L — ABNORMAL LOW (ref 0.40–4.50)

## 2023-10-28 LAB — T4, FREE: Free T4: 1 ng/dL (ref 0.8–1.8)

## 2023-11-02 DIAGNOSIS — M25512 Pain in left shoulder: Secondary | ICD-10-CM | POA: Diagnosis not present

## 2023-11-03 ENCOUNTER — Other Ambulatory Visit: Payer: Self-pay | Admitting: Orthopedic Surgery

## 2023-11-03 DIAGNOSIS — M25512 Pain in left shoulder: Secondary | ICD-10-CM

## 2023-11-04 ENCOUNTER — Ambulatory Visit (HOSPITAL_COMMUNITY)
Admission: RE | Admit: 2023-11-04 | Discharge: 2023-11-04 | Disposition: A | Discharge status: Home / Self Care | Source: Ambulatory Visit | Attending: "Endocrinology | Admitting: "Endocrinology

## 2023-11-04 DIAGNOSIS — E042 Nontoxic multinodular goiter: Secondary | ICD-10-CM | POA: Diagnosis not present

## 2023-11-04 DIAGNOSIS — E041 Nontoxic single thyroid nodule: Secondary | ICD-10-CM | POA: Diagnosis not present

## 2023-11-04 DIAGNOSIS — Z8349 Family history of other endocrine, nutritional and metabolic diseases: Secondary | ICD-10-CM | POA: Insufficient documentation

## 2023-11-14 ENCOUNTER — Ambulatory Visit
Admission: RE | Admit: 2023-11-14 | Discharge: 2023-11-14 | Disposition: A | Discharge status: Home / Self Care | Source: Ambulatory Visit | Attending: Orthopedic Surgery | Admitting: Orthopedic Surgery

## 2023-11-14 DIAGNOSIS — M25512 Pain in left shoulder: Secondary | ICD-10-CM

## 2023-11-16 ENCOUNTER — Encounter (HOSPITAL_BASED_OUTPATIENT_CLINIC_OR_DEPARTMENT_OTHER): Payer: Self-pay | Admitting: Orthopaedic Surgery

## 2023-11-16 ENCOUNTER — Encounter (HOSPITAL_BASED_OUTPATIENT_CLINIC_OR_DEPARTMENT_OTHER)
Admission: RE | Admit: 2023-11-16 | Discharge: 2023-11-16 | Disposition: A | Discharge status: Home / Self Care | Source: Ambulatory Visit | Attending: Orthopaedic Surgery

## 2023-11-16 ENCOUNTER — Other Ambulatory Visit: Payer: Self-pay

## 2023-11-16 DIAGNOSIS — M25512 Pain in left shoulder: Secondary | ICD-10-CM | POA: Diagnosis not present

## 2023-11-16 DIAGNOSIS — Z01818 Encounter for other preprocedural examination: Secondary | ICD-10-CM | POA: Diagnosis present

## 2023-11-16 DIAGNOSIS — I10 Essential (primary) hypertension: Secondary | ICD-10-CM | POA: Diagnosis not present

## 2023-11-16 DIAGNOSIS — Z01810 Encounter for preprocedural cardiovascular examination: Secondary | ICD-10-CM | POA: Insufficient documentation

## 2023-11-16 NOTE — Progress Notes (Addendum)
 Surgical soap given with instructions, pt verbalized understanding. Benzoyl peroxide gel given with instructions, pt verbalized understanding.

## 2023-11-17 NOTE — Discharge Instructions (Signed)
 Bonner Hair MD, MPH Aleck Stalling, PA-C [facility] [address] [phone] (tel)   [phone] (fax)   POST-OPERATIVE INSTRUCTIONS - SHOULDER ARTHROSCOPY  WOUND CARE You may remove the Operative Dressing on Post-Op Day #3 (72hrs after surgery).   Alternatively if you would like you can leave dressing on until follow-up if within 7-8 days but keep it dry. Leave steri-strips in place until they fall off on their own, usually 2 weeks postop. There may be a small amount of fluid/bleeding leaking at the surgical site.  This is normal; the shoulder is filled with fluid during the procedure and can leak for 24-48hrs after surgery.  You may change/reinforce the bandage as needed.  Use the Cryocuff or Ice as often as possible for the first 7 days, then as needed for pain relief. Always keep a towel, ACE wrap or other barrier between the cooling unit and your skin.  You may shower on Post-Op Day #3. Gently pat the area dry.  Do not soak the shoulder in water or submerge it.  Keep incisions as dry as possible. Do not go swimming in the pool or ocean until 4 weeks after surgery or when otherwise instructed.    EXERCISES Wear the sling at all times  You may remove the sling for showering, but keep the arm across the chest or in a secondary sling.     It is normal for your fingers/hand to become more swollen after surgery and discolored from bruising.   This will resolve over the first few weeks usually after surgery. Please continue to ambulate and do not stay sitting or lying for too long.  Perform foot and wrist pumps to assist in circulation.  PHYSICAL THERAPY - You will begin physical therapy soon after surgery (unless otherwise specified) - Please call to set up an appointment, if you do not already have one  - Let our office if there are any issues with scheduling your therapy   REGIONAL ANESTHESIA (NERVE BLOCKS) The anesthesia team may have  performed a nerve block for you this is a great tool used to minimize pain.   The block may start wearing off overnight (between 8-24 hours postop) When the block wears off, your pain may go from nearly zero to the pain you would have had postop without the block. This is an abrupt transition but nothing dangerous is happening.   This can be a challenging period but utilize your as needed pain medications to try and manage this period. We suggest you use the pain medication the first night prior to going to bed, to ease this transition.  You may take an extra dose of narcotic when this happens if needed  POST-OP MEDICATIONS- Multimodal approach to pain control In general your pain will be controlled with a combination of substances.  Prescriptions unless otherwise discussed are electronically sent to your pharmacy.  This is a carefully made plan we use to minimize narcotic use.     Meloxicam  - Anti-inflammatory medication taken on a scheduled basis Acetaminophen  - Non-narcotic pain medicine taken on a scheduled basis. Next dose of Tylenol  due at 3:10pm today. Oxycodone  - This is a strong narcotic, to be used only on an "as needed" basis for SEVERE pain. Zofran  - take as needed for nausea   FOLLOW-UP If you develop a Fever (>=101.5), Redness or Drainage from the surgical incision site, please call our office to arrange for an evaluation. Please call the office to schedule  a follow-up appointment for your first post-operative appointment, 7-10 days post-operatively.    HELPFUL INFORMATION   You may be more comfortable sleeping in a semi-seated position the first few nights following surgery.  Keep a pillow propped under the elbow and forearm for comfort.  If you have a recliner type of chair it might be beneficial.  If not that is fine too, but it would be helpful to sleep propped up with pillows behind your operated shoulder as well under your elbow and forearm.  This will reduce pulling on  the suture lines.  When dressing, put your operative arm in the sleeve first.  When getting undressed, take your operative arm out last.  Loose fitting, button-down shirts are recommended.  Often in the first days after surgery you may be more comfortable keeping your operative arm under your shirt and not through the sleeve.  You may return to work/school in the next couple of days when you feel up to it.  Desk work and typing in the sling is fine.  We suggest you use the pain medication the first night prior to going to bed, in order to ease any pain when the anesthesia wears off. You should avoid taking pain medications on an empty stomach as it will make you nauseous.  You should wean off your narcotic medicines as soon as you are able.  Most patients will be off narcotics before their first postop appointment.   Do not drink alcoholic beverages or take illicit drugs when taking pain medications.  It is against the law to drive while taking narcotics.  In some states it is against the law to drive while your arm is in a sling.   Pain medication may make you constipated.  Below are a few solutions to try in this order: Decrease the amount of pain medication if you aren't having pain. Drink lots of decaffeinated fluids. Drink prune juice and/or eat dried prunes  If the first 3 don't work start with additional solutions Take Colace - an over-the-counter stool softener Take Senokot - an over-the-counter laxative Take Miralax - a stronger over-the-counter laxative  For more information including helpful videos and documents visit our website:   [email or web address]    Post Anesthesia Home Care Instructions  Activity: Get plenty of rest for the remainder of the day. A responsible individual must stay with you for 24 hours following the procedure.  For the next 24 hours, DO NOT: -Drive a car -Advertising copywriter -Drink alcoholic beverages -Take any  medication unless instructed by your physician -Make any legal decisions or sign important papers.  Meals: Start with liquid foods such as gelatin or soup. Progress to regular foods as tolerated. Avoid greasy, spicy, heavy foods. If nausea and/or vomiting occur, drink only clear liquids until the nausea and/or vomiting subsides. Call your physician if vomiting continues.  Special Instructions/Symptoms: Your throat may feel dry or sore from the anesthesia or the breathing tube placed in your throat during surgery. If this causes discomfort, gargle with warm salt water. The discomfort should disappear within 24 hours.  If you had a scopolamine patch placed behind your ear for the management of post- operative nausea and/or vomiting:  1. The medication in the patch is effective for 72 hours, after which it should be removed.  Wrap patch in a tissue and discard in the trash. Wash hands thoroughly with soap and water. 2. You may remove the patch earlier than 72 hours if you experience unpleasant side effects  which may include dry mouth, dizziness or visual disturbances. 3. Avoid touching the patch. Wash your hands with soap and water after contact with the patch.   Regional Anesthesia Blocks  1. You may not be able to move or feel the blocked extremity after a regional anesthetic block. This may last may last from 3-48 hours after placement, but it will go away. The length of time depends on the medication injected and your individual response to the medication. As the nerves start to wake up, you may experience tingling as the movement and feeling returns to your extremity. If the numbness and inability to move your extremity has not gone away after 48 hours, please call your surgeon.   2. The extremity that is blocked will need to be protected until the numbness is gone and the strength has returned. Because you cannot feel it, you will need to take extra care to avoid injury. Because it may be  weak, you may have difficulty moving it or using it. You may not know what position it is in without looking at it while the block is in effect.  3. For blocks in the legs and feet, returning to weight bearing and walking needs to be done carefully. You will need to wait until the numbness is entirely gone and the strength has returned. You should be able to move your leg and foot normally before you try and bear weight or walk. You will need someone to be with you when you first try to ensure you do not fall and possibly risk injury.  4. Bruising and tenderness at the needle site are common side effects and will resolve in a few days.  5. Persistent numbness or new problems with movement should be communicated to the surgeon or the [facility] [phone] [facility] [phone]).Information for Discharge Teaching: EXPAREL  (bupivacaine  liposome injectable suspension)   Pain relief is important to your recovery. The goal is to control your pain so you can move easier and return to your normal activities as soon as possible after your procedure. Your physician may use several types of medicines to manage pain, swelling, and more.  Your surgeon or anesthesiologist gave you EXPAREL (bupivacaine ) to help control your pain after surgery.  EXPAREL  is a local anesthetic designed to release slowly over an extended period of time to provide pain relief by numbing the tissue around the surgical site. EXPAREL  is designed to release pain medication over time and can control pain for up to 72 hours. Depending on how you respond to EXPAREL , you may require less pain medication during your recovery. EXPAREL  can help reduce or eliminate the need for opioids during the first few days after surgery when pain relief is needed the most. EXPAREL  is not an opioid and is not addictive. It does not cause sleepiness or sedation.   Important! A teal colored band has been placed on your arm  with the date, time and amount of EXPAREL  you have received. Please leave this armband in place for the full 96 hours following administration, and then you may remove the band. If you return to the hospital for any reason within 96 hours following the administration of EXPAREL , the armband provides important information that your health care providers to know, and alerts them that you have received this anesthetic.    Possible side effects of EXPAREL : Temporary loss of sensation or ability to move in the area where medication was injected. Nausea, vomiting, constipation Rarely,  numbness and tingling in your mouth or lips, lightheadedness, or anxiety may occur. Call your doctor right away if you think you may be experiencing any of these sensations, or if you have other questions regarding possible side effects.  Follow all other discharge instructions given to you by your surgeon or nurse. Eat a healthy diet and drink plenty of water or other fluids.[employer]Joy Ultrasling IV/Ultrasling IIIER:  Please contact your surgeon if you have questions or concerns about your sling.

## 2023-11-17 NOTE — H&P (Signed)
 PREOPERATIVE H&P  Chief Complaint: Articular cartilage disorder of shoulder, osteoarthritis of left shoulder, bursitis, bicep tendonitis, rotator cuff tear  HPI: [name] is a 68 y.o. female who is scheduled for, Procedure(s): ARTHROSCOPY, SHOULDER, WITH ROTATOR CUFF REPAIR.   Patient has a past medical history significant for HTN.   Patient has had left shoulder pain for quite some time. She has failed conservative management.   Symptoms are rated as moderate to severe, and have been worsening.  This is significantly impairing activities of daily living.    Please see clinic note for further details on this patient's care.    She has elected for surgical management.   Past Medical History:  Diagnosis Date   Arthritis    Broken ankle 04/2014   will have surgery on 01/30/15-right ankle   COVID-19 11/24/2021   Elevated hemoglobin A1c 01/17/2015   6.0   Endometriosis    Herpes simplex type 1 infection    Hypertension    Migraine    h/o migraines, none since hysterectomy   Situational depression    Varicose vein    surgery on right leg   Past Surgical History:  Procedure Laterality Date   ABDOMINAL HYSTERECTOMY  1994   LAVH/RSO   ANKLE SURGERY Right 01/30/2015   BREAST BIOPSY Left    CESAREAN SECTION     CHOLECYSTECTOMY     COLON RESECTION  11/06   and lap LSO   CYSTECTOMY     HERNIA REPAIR  1982   HERNIA REPAIR  2003   and bladder repair   SHOULDER SURGERY Right 04/2017   Tendon repair    Social History   Socioeconomic History   Marital status: Married    Spouse name: Not on file   Number of children: 2   Years of education: Not on file   Highest education level: High school graduate  Occupational History   Not on file  Tobacco Use   Smoking status: Never    Passive exposure: Never   Smokeless tobacco: Never  Vaping Use   Vaping status: Never Used  Substance and Sexual Activity   Alcohol use: Yes    Alcohol/week: 2.0 - 3.0 standard drinks  of alcohol    Types: 2 - 3 Glasses of wine per week    Comment: Occassionally   Drug use: No   Sexual activity: Yes    Partners: Male    Birth control/protection: Surgical    Comment: LAVH/RSO, then LSO  Other Topics Concern   Not on file  Social History Narrative   She has two daughters. One daughter lives across the street with her family. Her oldest daughter lives at home with her, she has spina bifida.    Social Drivers of Corporate investment banker Strain: Low Risk  (01/05/2023)   Overall Financial Resource Strain (CARDIA)    Difficulty of Paying Living Expenses: Not very hard  Food Insecurity: No Food Insecurity (01/05/2023)   Hunger Vital Sign    Worried About Running Out of Food in the Last Year: Never true    Ran Out of Food in the Last Year: Never true  Transportation Needs: No Transportation Needs (01/05/2023)   PRAPARE - Administrator, Civil Service (Medical): No    Lack of Transportation (Non-Medical): No  Physical Activity: Insufficiently Active (01/05/2023)   Exercise Vital Sign    Days of Exercise per Week: 2 days    Minutes of Exercise per Session: 30  min  Stress: No Stress Concern Present (01/05/2023)   [employer] of Occupational Health - Occupational Stress Questionnaire    Feeling of Stress : Only a little  Social Connections: Socially Integrated (01/05/2023)   Social Connection and Isolation Panel    Frequency of Communication with Friends and Family: More than three times a week    Frequency of Social Gatherings with Friends and Family: Twice a week    Attends Religious Services: More than 4 times per year    Active Member of [employer] or Organizations: Yes    Attends Banker Meetings: 1 to 4 times per year    Marital Status: Married   Family History  Problem Relation Age of Onset   Skin cancer Other    Lung cancer Father    Spina bifida Daughter        and hydrocephalia   Diabetes Mother    Hypertension Mother    Thyroid   nodules Mother        being watched   Diabetes Sister        x 3   Hypertension Sister    Thyroid  nodules Sister    Allergies  Allergen Reactions   Betadine [Povidone Iodine] Itching   Codeine    Other     Band aids-itchy, red rash   Prior to Admission medications   Medication Sig Start Date End Date Taking? Authorizing Provider  ALPRAZolam  (XANAX ) 0.5 MG tablet TAKE 1/2 TO 1 TABLET(0.25 TO 0.5 MG) BY MOUTH AT BEDTIME AS NEEDED FOR SLEEP 07/14/23  Yes Early, Sara E, NP  aspirin  325 MG tablet Take 325 mg by mouth every other day.   Yes [provider]  benazepril  (LOTENSIN ) 40 MG tablet Take 1 tablet (40 mg total) by mouth daily. 01/05/23  Yes Early, Sara E, NP  CALCIUM  PO Take by mouth daily.   Yes [provider]  citalopram  (CELEXA ) 20 MG tablet Take 1 tablet (20 mg total) by mouth every other day. 07/14/23  Yes Early, Sara E, NP  Coenzyme Q10 (COQ-10 PO) Take by mouth.   Yes [provider]  estradiol  (ESTRACE ) 0.5 MG tablet Take 1 tablet (0.5 mg total) by mouth daily. 04/22/23  Yes Lo, Arland POUR, CNM  KRILL OIL PO Take by mouth. Omega red krill oil   Yes [provider]  levocetirizine (XYZAL ) 5 MG tablet Take 1 tablet (5 mg total) by mouth every evening. 01/05/23  Yes Early, Sara E, NP  meloxicam  (MOBIC ) 15 MG tablet TAKE 1 TABLET(15 MG) BY MOUTH DAILY AS NEEDED FOR ARTHRITIS PAIN 12/24/22  Yes Early, Sara E, NP  Red Yeast Rice Extract (RED YEAST RICE PO) Take by mouth daily.   Yes [provider]  vitamin C (ASCORBIC ACID) 500 MG tablet Take 1,000 mg by mouth 3 (three) times daily.   Yes [provider]  acyclovir  (ZOVIRAX ) 400 MG tablet TAKE 1 TABLET(400 MG) BY MOUTH THREE TIMES DAILY 08/17/23   Early, Sara E, NP  albuterol  (VENTOLIN  HFA) 108 (90 Base) MCG/ACT inhaler Inhale 2 puffs into the lungs every 4 (four) hours as needed for wheezing or shortness of breath. 07/14/23   Oris Camie BRAVO, NP    ROS: All other systems have been  reviewed and were otherwise negative with the exception of those mentioned in the HPI and as above.  Physical Exam: General: Alert, no acute distress Cardiovascular: No pedal edema Respiratory: No cyanosis, no use of accessory musculature GI: No organomegaly, abdomen  is soft and non-tender Skin: No lesions in the area of chief complaint Neurologic: Sensation intact distally Psychiatric: Patient is competent for consent with normal mood and affect Lymphatic: No axillary or cervical lymphadenopathy  MUSCULOSKELETAL:  Left shoulder: active forward elevation to 90 degrees with pain. Passive to 120 degrees. External rotation to 45 degrees. + O'Brien's. + Impingement. 4/5 supraspinatus testing.   Imaging: MRI demonstrates full thickness supraspinatus tear, fluid around the biceps, lateral hanging acromial spur, edema in the distal clavicle  Assessment: Articular cartilage disorder of shoulder, osteoarthritis of left shoulder, bursitis, bicep tendonitis, rotator cuff tear  Plan: Plan for Procedure(s): ARTHROSCOPY, SHOULDER, WITH ROTATOR CUFF REPAIR  The risks benefits and alternatives were discussed with the patient including but not limited to the risks of nonoperative treatment, versus surgical intervention including infection, bleeding, nerve injury,  blood clots, cardiopulmonary complications, morbidity, mortality, among others, and they were willing to proceed.   The patient acknowledged the explanation, agreed to proceed with the plan and consent was signed.   Operative Plan: left shoulder scope with SAD, +/-DCE, BT, RCR Discharge Medications: standard DVT Prophylaxis: none Physical Therapy: outpatient PT Special Discharge needs: Sling. [address]   Aleck LOISE Stalling, PA-C  11/17/2023 4:13 PM

## 2023-11-18 ENCOUNTER — Encounter (HOSPITAL_BASED_OUTPATIENT_CLINIC_OR_DEPARTMENT_OTHER): Payer: Self-pay | Admitting: Orthopaedic Surgery

## 2023-11-18 ENCOUNTER — Ambulatory Visit (HOSPITAL_BASED_OUTPATIENT_CLINIC_OR_DEPARTMENT_OTHER)
Admission: RE | Admit: 2023-11-18 | Discharge: 2023-11-18 | Disposition: A | Discharge status: Home / Self Care | Attending: Orthopaedic Surgery | Admitting: Orthopaedic Surgery

## 2023-11-18 ENCOUNTER — Ambulatory Visit (HOSPITAL_BASED_OUTPATIENT_CLINIC_OR_DEPARTMENT_OTHER): Admitting: Certified Registered"

## 2023-11-18 ENCOUNTER — Encounter (HOSPITAL_BASED_OUTPATIENT_CLINIC_OR_DEPARTMENT_OTHER)
Admission: RE | Disposition: A | Discharge status: Home / Self Care | Payer: Self-pay | Source: Home / Self Care | Attending: Orthopaedic Surgery

## 2023-11-18 DIAGNOSIS — M75102 Unspecified rotator cuff tear or rupture of left shoulder, not specified as traumatic: Secondary | ICD-10-CM | POA: Diagnosis not present

## 2023-11-18 DIAGNOSIS — M24112 Other articular cartilage disorders, left shoulder: Secondary | ICD-10-CM | POA: Diagnosis not present

## 2023-11-18 DIAGNOSIS — M75122 Complete rotator cuff tear or rupture of left shoulder, not specified as traumatic: Secondary | ICD-10-CM

## 2023-11-18 DIAGNOSIS — Z8249 Family history of ischemic heart disease and other diseases of the circulatory system: Secondary | ICD-10-CM | POA: Insufficient documentation

## 2023-11-18 DIAGNOSIS — I10 Essential (primary) hypertension: Secondary | ICD-10-CM | POA: Diagnosis not present

## 2023-11-18 DIAGNOSIS — F418 Other specified anxiety disorders: Secondary | ICD-10-CM | POA: Diagnosis not present

## 2023-11-18 DIAGNOSIS — M19012 Primary osteoarthritis, left shoulder: Secondary | ICD-10-CM | POA: Insufficient documentation

## 2023-11-18 DIAGNOSIS — S46012A Strain of muscle(s) and tendon(s) of the rotator cuff of left shoulder, initial encounter: Secondary | ICD-10-CM | POA: Diagnosis not present

## 2023-11-18 DIAGNOSIS — X58XXXA Exposure to other specified factors, initial encounter: Secondary | ICD-10-CM | POA: Diagnosis not present

## 2023-11-18 DIAGNOSIS — K219 Gastro-esophageal reflux disease without esophagitis: Secondary | ICD-10-CM | POA: Diagnosis not present

## 2023-11-18 DIAGNOSIS — M7542 Impingement syndrome of left shoulder: Secondary | ICD-10-CM | POA: Diagnosis not present

## 2023-11-18 DIAGNOSIS — Z79899 Other long term (current) drug therapy: Secondary | ICD-10-CM | POA: Insufficient documentation

## 2023-11-18 DIAGNOSIS — M7522 Bicipital tendinitis, left shoulder: Secondary | ICD-10-CM | POA: Insufficient documentation

## 2023-11-18 DIAGNOSIS — S43432A Superior glenoid labrum lesion of left shoulder, initial encounter: Secondary | ICD-10-CM | POA: Insufficient documentation

## 2023-11-18 DIAGNOSIS — M7552 Bursitis of left shoulder: Secondary | ICD-10-CM | POA: Insufficient documentation

## 2023-11-18 DIAGNOSIS — M25812 Other specified joint disorders, left shoulder: Secondary | ICD-10-CM | POA: Diagnosis not present

## 2023-11-18 DIAGNOSIS — G8918 Other acute postprocedural pain: Secondary | ICD-10-CM | POA: Diagnosis not present

## 2023-11-18 HISTORY — DX: Other specified postprocedural states: Z98.890

## 2023-11-18 HISTORY — PX: SHOULDER ARTHROSCOPY WITH ROTATOR CUFF REPAIR: SHX5685

## 2023-11-18 HISTORY — DX: Nausea with vomiting, unspecified: R11.2

## 2023-11-18 SURGERY — ARTHROSCOPY, SHOULDER, WITH ROTATOR CUFF REPAIR
Anesthesia: General | Site: Shoulder | Laterality: Left

## 2023-11-18 MED ORDER — BUPIVACAINE LIPOSOME 1.3 % IJ SUSP
INTRAMUSCULAR | Status: DC | PRN
Start: 2023-11-18 — End: 2023-11-18
  Administered 2023-11-18: 10 mL via PERINEURAL

## 2023-11-18 MED ORDER — MIDAZOLAM HCL 2 MG/2ML IJ SOLN
1.0000 mg | Freq: Once | INTRAMUSCULAR | Status: AC
Start: 2023-11-18 — End: 2023-11-18
  Administered 2023-11-18: 1 mg via INTRAVENOUS

## 2023-11-18 MED ORDER — CEFAZOLIN SODIUM-DEXTROSE 2-4 GM/100ML-% IV SOLN
INTRAVENOUS | Status: AC
  Filled 2023-11-18: qty 100

## 2023-11-18 MED ORDER — PROPOFOL 10 MG/ML IV BOLUS
INTRAVENOUS | Status: DC | PRN
  Administered 2023-11-18: 150 mg via INTRAVENOUS

## 2023-11-18 MED ORDER — FENTANYL CITRATE (PF) 100 MCG/2ML IJ SOLN: 25.0000 ug | INTRAMUSCULAR | Status: DC | PRN

## 2023-11-18 MED ORDER — DEXAMETHASONE SODIUM PHOSPHATE 4 MG/ML IJ SOLN
INTRAMUSCULAR | Status: DC | PRN
  Administered 2023-11-18: 4 mg via INTRAVENOUS

## 2023-11-18 MED ORDER — FENTANYL CITRATE (PF) 100 MCG/2ML IJ SOLN
INTRAMUSCULAR | Status: DC | PRN
  Administered 2023-11-18: 50 ug via INTRAVENOUS

## 2023-11-18 MED ORDER — PHENYLEPHRINE HCL (PRESSORS) 10 MG/ML IV SOLN
INTRAVENOUS | Status: DC | PRN
  Administered 2023-11-18 (×2): 80 ug via INTRAVENOUS

## 2023-11-18 MED ORDER — PROPOFOL 1000 MG/100ML IV EMUL
INTRAVENOUS | Status: AC
Start: 2023-11-18 — End: 2023-11-18
  Filled 2023-11-18: qty 100

## 2023-11-18 MED ORDER — OXYCODONE HCL 5 MG PO TABS: 5.0000 mg | ORAL_TABLET | Freq: Once | ORAL | Status: DC | PRN

## 2023-11-18 MED ORDER — ACETAMINOPHEN 500 MG PO TABS
ORAL_TABLET | ORAL | Status: AC
  Filled 2023-11-18: qty 2

## 2023-11-18 MED ORDER — ACETAMINOPHEN 500 MG PO TABS: 1000.0000 mg | ORAL_TABLET | Freq: Three times a day (TID) | ORAL | 0 refills | Status: AC

## 2023-11-18 MED ORDER — DROPERIDOL 2.5 MG/ML IJ SOLN: 0.6250 mg | Freq: Once | INTRAMUSCULAR | Status: DC | PRN

## 2023-11-18 MED ORDER — EPINEPHRINE PF 1 MG/ML IJ SOLN
INTRAMUSCULAR | Status: AC
  Filled 2023-11-18: qty 3

## 2023-11-18 MED ORDER — ONDANSETRON HCL 4 MG PO TABS: 4.0000 mg | ORAL_TABLET | Freq: Three times a day (TID) | ORAL | 0 refills | Status: AC | PRN

## 2023-11-18 MED ORDER — GABAPENTIN 300 MG PO CAPS
ORAL_CAPSULE | ORAL | Status: AC
  Filled 2023-11-18: qty 1

## 2023-11-18 MED ORDER — OXYCODONE HCL 5 MG/5ML PO SOLN: 5.0000 mg | Freq: Once | ORAL | Status: DC | PRN

## 2023-11-18 MED ORDER — ROCURONIUM BROMIDE 100 MG/10ML IV SOLN
INTRAVENOUS | Status: DC | PRN
  Administered 2023-11-18: 60 mg via INTRAVENOUS

## 2023-11-18 MED ORDER — ACETAMINOPHEN 500 MG PO TABS
1000.0000 mg | ORAL_TABLET | Freq: Once | ORAL | Status: AC
Start: 2023-11-18 — End: 2023-11-18
  Administered 2023-11-18: 1000 mg via ORAL

## 2023-11-18 MED ORDER — TRANEXAMIC ACID-NACL 1000-0.7 MG/100ML-% IV SOLN
INTRAVENOUS | Status: AC
  Filled 2023-11-18: qty 100

## 2023-11-18 MED ORDER — ONDANSETRON HCL 4 MG/2ML IJ SOLN
INTRAMUSCULAR | Status: DC | PRN
  Administered 2023-11-18: 4 mg via INTRAVENOUS

## 2023-11-18 MED ORDER — FENTANYL CITRATE (PF) 100 MCG/2ML IJ SOLN
50.0000 ug | Freq: Once | INTRAMUSCULAR | Status: AC
  Administered 2023-11-18: 50 ug via INTRAVENOUS

## 2023-11-18 MED ORDER — LACTATED RINGERS IV SOLN: INTRAVENOUS | Status: DC

## 2023-11-18 MED ORDER — BUPIVACAINE-EPINEPHRINE (PF) 0.5% -1:200000 IJ SOLN
INTRAMUSCULAR | Status: DC | PRN
  Administered 2023-11-18: 15 mL via PERINEURAL

## 2023-11-18 MED ORDER — SUGAMMADEX SODIUM 200 MG/2ML IV SOLN
INTRAVENOUS | Status: DC | PRN
  Administered 2023-11-18: 180 mg via INTRAVENOUS

## 2023-11-18 MED ORDER — FENTANYL CITRATE (PF) 100 MCG/2ML IJ SOLN
INTRAMUSCULAR | Status: AC
  Filled 2023-11-18: qty 2

## 2023-11-18 MED ORDER — OXYCODONE HCL 5 MG PO TABS: ORAL_TABLET | ORAL | 0 refills | Status: AC

## 2023-11-18 MED ORDER — TRANEXAMIC ACID-NACL 1000-0.7 MG/100ML-% IV SOLN
1000.0000 mg | INTRAVENOUS | Status: AC
  Administered 2023-11-18: 1000 mg via INTRAVENOUS

## 2023-11-18 MED ORDER — CEFAZOLIN SODIUM-DEXTROSE 2-4 GM/100ML-% IV SOLN
2.0000 g | INTRAVENOUS | Status: AC
  Administered 2023-11-18: 2 g via INTRAVENOUS

## 2023-11-18 MED ORDER — GABAPENTIN 300 MG PO CAPS
300.0000 mg | ORAL_CAPSULE | Freq: Once | ORAL | Status: AC
Start: 2023-11-18 — End: 2023-11-18
  Administered 2023-11-18: 300 mg via ORAL

## 2023-11-18 MED ORDER — MIDAZOLAM HCL 2 MG/2ML IJ SOLN
INTRAMUSCULAR | Status: AC
  Filled 2023-11-18: qty 2

## 2023-11-18 MED ORDER — PROPOFOL 500 MG/50ML IV EMUL
INTRAVENOUS | Status: DC | PRN
  Administered 2023-11-18: 125 ug/kg/min via INTRAVENOUS

## 2023-11-18 MED ORDER — MELOXICAM 15 MG PO TABS: 15.0000 mg | ORAL_TABLET | Freq: Every day | ORAL | 0 refills | Status: AC

## 2023-11-18 SURGICAL SUPPLY — 49 items
ANCHOR FBRTK 2.6 SUTURETAP 1.3 (Anchor) IMPLANT
ANCHOR SUT 1.8 FIBERTAK SB KL (Anchor) IMPLANT
ANCHOR SUT BIO SW 4.75X19.1 (Anchor) IMPLANT
BLADE EXCALIBUR 4.0X13 (MISCELLANEOUS) ×1 IMPLANT
BURR OVAL 8 FLU 4.0X13 (MISCELLANEOUS) ×1 IMPLANT
CANNULA 5.75X71 LONG (CANNULA) IMPLANT
CANNULA PASSPORT 5 (CANNULA) IMPLANT
CANNULA PASSPORT BUTTON 10-40 (CANNULA) IMPLANT
CANNULA TWIST IN 8.25X7CM (CANNULA) IMPLANT
CHLORAPREP W/TINT 26 (MISCELLANEOUS) ×1 IMPLANT
CLSR STERI-STRIP ANTIMIC 1/2X4 (GAUZE/BANDAGES/DRESSINGS) ×1 IMPLANT
COOLER ICEMAN CLASSIC (MISCELLANEOUS) ×1 IMPLANT
DRAPE IMP U-DRAPE 54X76 (DRAPES) ×1 IMPLANT
DRAPE INCISE IOBAN 66X45 STRL (DRAPES) IMPLANT
DRAPE SHOULDER BEACH CHAIR (DRAPES) ×1 IMPLANT
DW OUTFLOW CASSETTE/TUBE SET (MISCELLANEOUS) ×1 IMPLANT
GAUZE PAD ABD 8X10 STRL (GAUZE/BANDAGES/DRESSINGS) ×1 IMPLANT
GAUZE SPONGE 4X4 12PLY STRL (GAUZE/BANDAGES/DRESSINGS) ×1 IMPLANT
GLOVE BIO SURGEON STRL SZ 6.5 (GLOVE) ×1 IMPLANT
GLOVE BIOGEL PI IND STRL 6.5 (GLOVE) ×1 IMPLANT
GLOVE BIOGEL PI IND STRL 8 (GLOVE) ×1 IMPLANT
GLOVE ECLIPSE 8.0 STRL XLNG CF (GLOVE) ×1 IMPLANT
GOWN STRL REUS W/ TWL LRG LVL3 (GOWN DISPOSABLE) ×2 IMPLANT
GOWN STRL REUS W/TWL XL LVL3 (GOWN DISPOSABLE) ×1 IMPLANT
KIT BURR TROCHL W/2.9 BURR (KITS) IMPLANT
KIT STABILIZATION SHOULDER (MISCELLANEOUS) ×1 IMPLANT
KIT STR SPEAR 1.8 FBRTK DISP (KITS) IMPLANT
LASSO CRESCENT QUICKPASS (SUTURE) IMPLANT
MANIFOLD NEPTUNE II (INSTRUMENTS) ×1 IMPLANT
NDL HD SCORPION MEGA LOADER (NEEDLE) IMPLANT
NDL SAFETY ECLIPSE 18X1.5 (NEEDLE) ×1 IMPLANT
PACK ARTHROSCOPY DSU (CUSTOM PROCEDURE TRAY) ×1 IMPLANT
PACK BASIN DAY SURGERY FS (CUSTOM PROCEDURE TRAY) ×1 IMPLANT
PAD COLD SHLDR WRAP-ON (PAD) ×1 IMPLANT
RESTRAINT HEAD UNIVERSAL NS (MISCELLANEOUS) ×1 IMPLANT
SHEET MEDIUM DRAPE 40X70 STRL (DRAPES) ×1 IMPLANT
SLEEVE SCD COMPRESS KNEE MED (STOCKING) ×1 IMPLANT
SLING ARM FOAM STRAP LRG (SOFTGOODS) IMPLANT
SUT MNCRL AB 4-0 PS2 18 (SUTURE) ×1 IMPLANT
SUT PDS AB 0 CT 36 (SUTURE) IMPLANT
SUT TIGER TAPE 7 IN WHITE (SUTURE) IMPLANT
SUTURE FIBERWR #2 38 T-5 BLUE (SUTURE) IMPLANT
SUTURE TAPE TIGERLINK 1.3MM BL (SUTURE) IMPLANT
SYR 5ML LL (SYRINGE) ×1 IMPLANT
TAPE FIBER 2MM 7IN #2 BLUE (SUTURE) IMPLANT
TOWEL GREEN STERILE FF (TOWEL DISPOSABLE) ×2 IMPLANT
TUBE CONNECTING 20X1/4 (TUBING) ×1 IMPLANT
TUBING ARTHROSCOPY IRRIG 16FT (MISCELLANEOUS) ×1 IMPLANT
WAND ABLATOR APOLLO I90 (BUR) ×1 IMPLANT

## 2023-11-18 NOTE — Anesthesia Procedure Notes (Signed)
 Anesthesia Regional Block: Interscalene brachial plexus block   Pre-Anesthetic Checklist: , timeout performed,  Correct Patient, Correct Site, Correct Laterality,  Correct Procedure, Correct Position, site marked,  Risks and benefits discussed,  Pre-op evaluation,  At surgeon's request and post-op pain management  Laterality: Left  Prep: Maximum Sterile Barrier Precautions used, chloraprep       Needles:  Injection technique: Single-shot  Needle Type: Echogenic Stimulator Needle     Needle Length: 4cm  Needle Gauge: 22     Additional Needles:   Procedures:,,,, ultrasound used (permanent image in chart),,    Narrative:  Start time: 11/18/2023 9:23 AM End time: 11/18/2023 9:26 AM Injection made incrementally with aspirations every 5 mL.  Performed by: Personally  Anesthesiologist: Paul Lamarr BRAVO, MD  Additional Notes: Risks, benefits, and alternative discussed. Patient gave consent for procedure. Patient prepped and draped in sterile fashion. Sedation administered, patient remains easily responsive to voice. Relevant anatomy identified with ultrasound guidance. Local anesthetic given in 5cc increments with no signs or symptoms of intravascular injection. No pain or paraesthesias with injection. Patient monitored throughout procedure with signs of LAST or immediate complications. Tolerated well. Ultrasound image placed in chart.  LANEY Paul, MD

## 2023-11-18 NOTE — Op Note (Signed)
 Orthopaedic Surgery Operative Note (CSN: [id number])  [name]  April 19, 1956 Date of Surgery: 11/18/2023   DIAGNOSES: Left shoulder, acute on chronic rotator cuff tear, SLAP tear, biceps tendinitis, AC arthritis, and subacromial impingement.  POST-OPERATIVE DIAGNOSIS: same  PROCEDURE: Arthroscopic extensive debridement - 29823 Subdeltoid Bursa, Supraspinatus Tendon, Anterior Labrum, Superior Labrum, Posterior Labrum, and Glenoid bone, glenoid cartilage, humeral bone and humeral cartilage Arthroscopic distal clavicle excision - 70175 Arthroscopic subacromial decompression - 70173 Arthroscopic rotator cuff repair - 70172 Arthroscopic biceps tenodesis - 70171   OPERATIVE FINDING: Exam under anesthesia: Normal Articular space: Anterior posterior labral tearing, no loose bodies Chondral surfaces: Grade 2 and 3 changes scattered throughout the humeral head, some small areas of pitting early arthritis Biceps: Type II SLAP tear Subscapularis: Intact  Supraspinatus: Complete tear leading edge supraspinatus into the posterior half.  1 x 2 repair performed. Infraspinatus: Intact   Patient had reasonable tissue quality but a chronic appearing tear.  It was full-thickness.  1 x 2 repair performed.  Patient will have early therapy.   Post-operative plan: The patient will be non-weightbearing in a sling .  The patient will be discharged home.  DVT prophylaxis not indicated in ambulatory upper extremity patient without known risk factors.   Pain control with PRN pain medication preferring oral medicines.  Follow up plan will be scheduled in approximately 7 days for incision check and XR.  Surgeons:Primary: Cristy Bonner DASEN, MD Assistants:Caroline McBane, PA-C Location: [facility] OR ROOM 1 Anesthesia: General with Exparel  interscalene block Antibiotics: Ancef  2 g Tourniquet time: None Estimated Blood Loss: Minimal Complications: None Specimens: None Implants: Implant Name Type Inv. Item Serial No.  Manufacturer Lot No. LRB No. Used Action  ANCHOR SUT 1.8 FIBERTAK SB KL - [id number] Anchor ANCHOR SUT 1.8 FIBERTAK SB KL  [employer] [id number] Left 1 Implanted  ANCHOR SUT 1.8 FIBERTAK SB KL - [id number] Anchor ANCHOR SUT 1.8 FIBERTAK SB KL  [employer] [id number] Left 1 Implanted  ANCHOR FBRTK 2.6 SUTURETAP 1.3 - [id number] Anchor ANCHOR FBRTK 2.6 SUTURETAP 1.3  [employer] [id number] Left 1 Implanted  ANCHOR SUT BIO SW 4.75X19.1 - [id number] Anchor ANCHOR SUT BIO SW 4.75X19.1  [employer] [id number] Left 2 Implanted    Indications for Surgery:   [name] is a 68 y.o. female with continued shoulder pain refractory to nonoperative measures for extended period of time.    The risks and benefits were explained at length including but not limited to continued pain, cuff failure, biceps tenodesis failure, stiffness, need for further surgery and infection.   Procedure:   Patient was correctly identified in the preoperative holding area and operative site marked.  Patient brought to OR and positioned beachchair on an [place] table ensuring that all bony prominences were padded and the head was in an appropriate location.  Anesthesia was induced and the operative shoulder was prepped and draped in the usual sterile fashion.  Timeout was called preincision.  A standard posterior viewing portal was made after localizing the portal with a spinal needle.  An anterior accessory portal was also made.  After clearing the articular space the camera was positioned in the subacromial space.  Findings above.    Extensive debridement was performed of the anterior interval tissue, labral fraying and the bursa.  Glenoid bone, glenoid cartilage, humeral bone were all debrided.  Subacromial decompression: We made a lateral portal with spinal needle guidance. We then proceeded to debride bursal tissue extensively with a shaver and arthrocare device. At  that point we continued to identify the borders of the acromion and  identify the spur. We then carefully preserved the deltoid fascia and used a burr to convert the acromion to a Type 1 flat acromion without issue.  Biceps tenodesis: We marked the tendon and then performed a tenotomy and debridement of the stump in the articular space. We then identified the biceps tendon in its groove suprapec with the arthroscope in the lateral portal taking care to move from lateral to medial to avoid injury to the subscapularis. At that point we unroofed the tendon itself and mobilized it. An accessory anterior portal was made in line with the tendon and we grasped it from the anterior superior portal and worked from the accessory anterior portal. Two Fibertak 1.75mm knotless anchors were placed in the groove and the tendon was secured in a luggage loop style fashion with a pass of the limb of suture through the tendon using a scorpion device to avoid pull-through.  Repair was completed with good tension on the tendon.  Residual stump of the tendon was removed after being resected with a RF ablator.  Distal Clavicle resection:  The scope was placed in the subacromial space from the posterior portal.  A hemostat was placed through the anterior portal and we spread at the [facility] joint.  A burr was then inserted and 10 mm of distal clavicle was resected taking care to avoid damage to the capsule around the joint and avoiding overhanging bone posteriorly.    Rotator cuff repair: We identified the full-thickness tear.  We cleared unhealthy tissue and prepared the tuberosity for healing.  At that point we placed one 2.6 fiber tack anchor loaded with 2 sets of sliding sutures for 4 total limbs at the medial articular margin.  We passed these with a scorpion device and brought 2 tapes from each anchor to a combination of 2 lateral 4.75 bio composite swivel locks 8 to 10 mm below the tuberosity.  The double row repair had good imbrication of tissue to bone.  The incisions were closed with absorbable  monocryl and steri strips.  A sterile dressing was placed along with a sling. The patient was awoken from general anesthesia and taken to the PACU in stable condition without complication.   Aleck Stalling, PA-C, present and scrubbed throughout the case, critical for completion in a timely fashion, and for retraction, instrumentation, closure.

## 2023-11-18 NOTE — Anesthesia Procedure Notes (Signed)
 Procedure Name: Intubation Date/Time: 11/18/2023 9:47 AM  Performed by: Julieanne Fairy BROCKS, CRNAPre-anesthesia Checklist: Patient identified, Emergency Drugs available, Suction available and Patient being monitored Patient Re-evaluated:Patient Re-evaluated prior to induction Oxygen Delivery Method: Circle system utilized Preoxygenation: Pre-oxygenation with 100% oxygen Induction Type: IV induction Ventilation: Mask ventilation without difficulty Laryngoscope Size: Mac and 3 Grade View: Grade II Tube type: Oral Tube size: 7.0 mm Number of attempts: 1 Airway Equipment and Method: Stylet and Oral airway Placement Confirmation: ETT inserted through vocal cords under direct vision, positive ETCO2 and breath sounds checked- equal and bilateral Secured at: 20 cm Tube secured with: Tape Dental Injury: Teeth and Oropharynx as per pre-operative assessment

## 2023-11-18 NOTE — Transfer of Care (Signed)
 Immediate Anesthesia Transfer of Care Note  Patient: [name]  Procedure(s) Performed: ARTHROSCOPY, SHOULDER, WITH ROTATOR CUFF REPAIR (Left: Shoulder)  Patient Location: PACU  Anesthesia Type:General and Regional  Level of Consciousness: awake, alert , and oriented  Airway & Oxygen Therapy: Patient Spontanous Breathing and Patient connected to face mask oxygen  Post-op Assessment: Report given to RN and Post -op Vital signs reviewed and stable  Post vital signs: Reviewed and stable  Last Vitals:  Vitals Value Taken Time  BP 135/82 11/18/23 11:00  Temp    Pulse 69 11/18/23 11:01  Resp 12 11/18/23 11:01  SpO2 97 % 11/18/23 11:01  Vitals shown include unfiled device data.  Last Pain:  Vitals:   11/18/23 0909  TempSrc: Temporal  PainSc: 5       Patients Stated Pain Goal: 3 (11/18/23 0909)  Complications: No notable events documented.

## 2023-11-18 NOTE — Anesthesia Preprocedure Evaluation (Addendum)
 Anesthesia Evaluation  Patient identified by MRN, date of birth, ID band Patient awake    Reviewed: Allergy & Precautions, NPO status , Patient's Chart, lab work & pertinent test results  History of Anesthesia Complications (+) PONV and history of anesthetic complications  Airway Mallampati: II  TM Distance: >3 FB Neck ROM: Full    Dental no notable dental hx.    Pulmonary neg pulmonary ROS   Pulmonary exam normal        Cardiovascular hypertension, Pt. on medications Normal cardiovascular exam     Neuro/Psych  Headaches  Anxiety Depression       GI/Hepatic Neg liver ROS,GERD  ,,  Endo/Other  negative endocrine ROS    Renal/GU negative Renal ROS  negative genitourinary   Musculoskeletal Articular cartilage disorder of shoulder, osteoarthritis of left shoulder, bursitis, bicep tendonitis, rotator cuff tear   Abdominal   Peds  Hematology negative hematology ROS (+)   Anesthesia Other Findings Day of surgery medications reviewed with patient.  Reproductive/Obstetrics                              Anesthesia Physical Anesthesia Plan  ASA: 2  Anesthesia Plan: General   Post-op Pain Management: Regional block*   Induction: Intravenous  PONV Risk Score and Plan: 3 and Treatment may vary due to age or medical condition, Ondansetron , Dexamethasone , Midazolam , Propofol  infusion and TIVA  Airway Management Planned: Oral ETT  Additional Equipment: None  Intra-op Plan:   Post-operative Plan: Extubation in OR  Informed Consent: I have reviewed the patients History and Physical, chart, labs and discussed the procedure including the risks, benefits and alternatives for the proposed anesthesia with the patient or authorized representative who has indicated his/her understanding and acceptance.     Dental advisory given  Plan Discussed with: CRNA  Anesthesia Plan Comments:           Anesthesia Quick Evaluation

## 2023-11-18 NOTE — Anesthesia Postprocedure Evaluation (Signed)
 Anesthesia Post Note  Patient: [name]  Procedure(s) Performed: ARTHROSCOPY, SHOULDER, WITH ROTATOR CUFF REPAIR (Left: Shoulder)     Patient location during evaluation: PACU Anesthesia Type: General Level of consciousness: awake and alert Pain management: pain level controlled Vital Signs Assessment: post-procedure vital signs reviewed and stable Respiratory status: spontaneous breathing, nonlabored ventilation and respiratory function stable Cardiovascular status: blood pressure returned to baseline Postop Assessment: no apparent nausea or vomiting Anesthetic complications: no   No notable events documented.  Last Vitals:  Vitals:   11/18/23 1130 11/18/23 1142  BP: 131/76 104/84  Pulse: 61 71  Resp: 13 20  Temp:  (!) 36.1 C  SpO2: 97% 95%    Last Pain:  Vitals:   11/18/23 1142  TempSrc: Temporal  PainSc: 0-No pain                 Vertell Row

## 2023-11-18 NOTE — Progress Notes (Signed)
 Assisted Dr. Stephannie Peters with left, interscalene , ultrasound guided block. Side rails up, monitors on throughout procedure. See vital signs in flow sheet. Tolerated Procedure well.

## 2023-11-19 ENCOUNTER — Encounter (HOSPITAL_BASED_OUTPATIENT_CLINIC_OR_DEPARTMENT_OTHER): Payer: Self-pay | Admitting: Orthopaedic Surgery

## 2023-11-22 DIAGNOSIS — M25612 Stiffness of left shoulder, not elsewhere classified: Secondary | ICD-10-CM | POA: Diagnosis not present

## 2023-11-22 DIAGNOSIS — R6889 Other general symptoms and signs: Secondary | ICD-10-CM | POA: Diagnosis not present

## 2023-11-22 DIAGNOSIS — Z9889 Other specified postprocedural states: Secondary | ICD-10-CM | POA: Diagnosis not present

## 2023-11-22 DIAGNOSIS — R531 Weakness: Secondary | ICD-10-CM | POA: Diagnosis not present

## 2023-11-24 ENCOUNTER — Ambulatory Visit: Admitting: "Endocrinology

## 2023-11-24 DIAGNOSIS — Z9889 Other specified postprocedural states: Secondary | ICD-10-CM | POA: Diagnosis not present

## 2023-11-24 DIAGNOSIS — R531 Weakness: Secondary | ICD-10-CM | POA: Diagnosis not present

## 2023-11-24 DIAGNOSIS — M25612 Stiffness of left shoulder, not elsewhere classified: Secondary | ICD-10-CM | POA: Diagnosis not present

## 2023-11-24 DIAGNOSIS — R6889 Other general symptoms and signs: Secondary | ICD-10-CM | POA: Diagnosis not present

## 2023-11-26 DIAGNOSIS — M19012 Primary osteoarthritis, left shoulder: Secondary | ICD-10-CM | POA: Diagnosis not present

## 2023-11-30 DIAGNOSIS — R531 Weakness: Secondary | ICD-10-CM | POA: Diagnosis not present

## 2023-11-30 DIAGNOSIS — Z9889 Other specified postprocedural states: Secondary | ICD-10-CM | POA: Diagnosis not present

## 2023-11-30 DIAGNOSIS — R6889 Other general symptoms and signs: Secondary | ICD-10-CM | POA: Diagnosis not present

## 2023-11-30 DIAGNOSIS — M25612 Stiffness of left shoulder, not elsewhere classified: Secondary | ICD-10-CM | POA: Diagnosis not present

## 2023-12-03 DIAGNOSIS — R6889 Other general symptoms and signs: Secondary | ICD-10-CM | POA: Diagnosis not present

## 2023-12-03 DIAGNOSIS — R531 Weakness: Secondary | ICD-10-CM | POA: Diagnosis not present

## 2023-12-03 DIAGNOSIS — Z9889 Other specified postprocedural states: Secondary | ICD-10-CM | POA: Diagnosis not present

## 2023-12-03 DIAGNOSIS — M25612 Stiffness of left shoulder, not elsewhere classified: Secondary | ICD-10-CM | POA: Diagnosis not present

## 2023-12-07 DIAGNOSIS — Z9889 Other specified postprocedural states: Secondary | ICD-10-CM | POA: Diagnosis not present

## 2023-12-07 DIAGNOSIS — R6889 Other general symptoms and signs: Secondary | ICD-10-CM | POA: Diagnosis not present

## 2023-12-07 DIAGNOSIS — M25612 Stiffness of left shoulder, not elsewhere classified: Secondary | ICD-10-CM | POA: Diagnosis not present

## 2023-12-07 DIAGNOSIS — R29898 Other symptoms and signs involving the musculoskeletal system: Secondary | ICD-10-CM | POA: Diagnosis not present

## 2023-12-10 DIAGNOSIS — R29898 Other symptoms and signs involving the musculoskeletal system: Secondary | ICD-10-CM | POA: Diagnosis not present

## 2023-12-10 DIAGNOSIS — R6889 Other general symptoms and signs: Secondary | ICD-10-CM | POA: Diagnosis not present

## 2023-12-10 DIAGNOSIS — M25612 Stiffness of left shoulder, not elsewhere classified: Secondary | ICD-10-CM | POA: Diagnosis not present

## 2023-12-10 DIAGNOSIS — Z9889 Other specified postprocedural states: Secondary | ICD-10-CM | POA: Diagnosis not present

## 2023-12-13 DIAGNOSIS — M25612 Stiffness of left shoulder, not elsewhere classified: Secondary | ICD-10-CM | POA: Diagnosis not present

## 2023-12-13 DIAGNOSIS — Z9889 Other specified postprocedural states: Secondary | ICD-10-CM | POA: Diagnosis not present

## 2023-12-13 DIAGNOSIS — R6889 Other general symptoms and signs: Secondary | ICD-10-CM | POA: Diagnosis not present

## 2023-12-13 DIAGNOSIS — R29898 Other symptoms and signs involving the musculoskeletal system: Secondary | ICD-10-CM | POA: Diagnosis not present

## 2023-12-16 DIAGNOSIS — R6889 Other general symptoms and signs: Secondary | ICD-10-CM | POA: Diagnosis not present

## 2023-12-16 DIAGNOSIS — M25612 Stiffness of left shoulder, not elsewhere classified: Secondary | ICD-10-CM | POA: Diagnosis not present

## 2023-12-16 DIAGNOSIS — Z9889 Other specified postprocedural states: Secondary | ICD-10-CM | POA: Diagnosis not present

## 2023-12-16 DIAGNOSIS — R29898 Other symptoms and signs involving the musculoskeletal system: Secondary | ICD-10-CM | POA: Diagnosis not present

## 2023-12-20 DIAGNOSIS — R531 Weakness: Secondary | ICD-10-CM | POA: Diagnosis not present

## 2023-12-20 DIAGNOSIS — Z9889 Other specified postprocedural states: Secondary | ICD-10-CM | POA: Diagnosis not present

## 2023-12-20 DIAGNOSIS — R6889 Other general symptoms and signs: Secondary | ICD-10-CM | POA: Diagnosis not present

## 2023-12-20 DIAGNOSIS — M25612 Stiffness of left shoulder, not elsewhere classified: Secondary | ICD-10-CM | POA: Diagnosis not present

## 2023-12-21 ENCOUNTER — Ambulatory Visit (INDEPENDENT_AMBULATORY_CARE_PROVIDER_SITE_OTHER): Admitting: "Endocrinology

## 2023-12-21 ENCOUNTER — Encounter: Payer: Self-pay | Admitting: "Endocrinology

## 2023-12-21 VITALS — BP 124/80 | HR 84 | Ht 65.5 in | Wt 189.0 lb

## 2023-12-21 DIAGNOSIS — Z8349 Family history of other endocrine, nutritional and metabolic diseases: Secondary | ICD-10-CM

## 2023-12-21 DIAGNOSIS — E042 Nontoxic multinodular goiter: Secondary | ICD-10-CM | POA: Diagnosis not present

## 2023-12-21 DIAGNOSIS — E0590 Thyrotoxicosis, unspecified without thyrotoxic crisis or storm: Secondary | ICD-10-CM | POA: Diagnosis not present

## 2023-12-21 NOTE — Progress Notes (Signed)
 Outpatient Endocrinology Note Amy Birmingham, MD  12/21/23   [name] June 23, 1955 [id number]  Referring Provider: Oris Camie BRAVO, NP Primary Care Provider: Oris Camie BRAVO, NP Subjective  No chief complaint on file.   Assessment & Plan  Diagnoses and all orders for this visit:  Multinodular goiter -     Cancel: US  FNA BX THYROID  1ST LESION AFIRMA; Future -     Cancel: US  FNA BIOPSY THYROID  EA ADD LESION AFIRMA; Future -     Cancel: US  FNA BIOPSY THYROID  EA ADD LESION AFIRMA -     Cancel: US  FNA BX THYROID  1ST LESION AFIRMA -     TSH + free T4 -     T3, free -     US  FNA BIOPSY THYROID  EA ADD LESION AFIRMA; Future -     US  FNA BX THYROID  1ST LESION AFIRMA; Future  Subclinical hyperthyroidism  Family history of thyroid  disease in mother   [name] is currently not taking any thyroid  medication.  06/2023  and 10/2023 TSH was 0.3 (baseline TSH 0.3-0.8).   Thyroid  disease runs in mother and sister. Patient was last biochemically subclinically hyperthyroid.  Educated on thyroid  axis.  Recommend the following: continue monitoring. Repeat lab before next visit or sooner if symptoms of hyperthyroidism or hypothyroidism develop.  Notify us  immediately in case of significant weight gain or loss. Counseled on compliance and follow up needs.  Thyroid  disease runs in mother and sister. Patient reports sometimes feels like something is pressing on throat  11/04/23: Baseline thyroid  ultrasound:  IMPRESSION: 1. A TR 5 nodule is present in the right upper pole measuring 1.7 cm. **Given size (>/= 1.0 cm) and appearance, fine needle aspiration of this highly suspicious nodule should be considered based on TI-RADS criteria. ORDERED FNA 2. A TR 5 nodule is present in the right lower pole measuring 2.2 cm. **Given size (>/= 1.0 cm) and appearance, fine needle aspiration of this highly suspicious nodule should be considered based on TI-RADS criteria.  ORDERED FNA 3. The TR 4 nodule  is present in the left upper pole measuring 1.5 cm. **Given size (>/= 1.5 cm) and appearance, fine needle aspiration of this moderately suspicious nodule should be considered based on TI-RADS criteria. RECOMMEND F/U in 6 mo and FNA if still indicated   I have reviewed current medications, nurse's notes, allergies, vital signs, past medical and surgical history, family medical history, and social history for this encounter. Counseled patient on symptoms, examination findings, lab findings, imaging results, treatment decisions and monitoring and prognosis. The patient understood the recommendations and agrees with the treatment plan. All questions regarding treatment plan were fully answered.   Return in about 6 months (around 06/22/2024) for visit + labs before next visit.   Amy Birmingham, MD  12/21/23   I have reviewed current medications, nurse's notes, allergies, vital signs, past medical and surgical history, family medical history, and social history for this encounter. Counseled patient on symptoms, examination findings, lab findings, imaging results, treatment decisions and monitoring and prognosis. The patient understood the recommendations and agrees with the treatment plan. All questions regarding treatment plan were fully answered.   History of Present Illness [name] is a 68 y.o. year old female who presents to our clinic with subclinical hyperthyroidism diagnosed in 06/2023 with TSH of 0.3 (baseline TSH 0.3-0.8).    Never been on thyroid  medication Parents hit by drunk driver, father passed away, mother taken care of by patient  for 3 yrs until mother passed away. Patient reports going downhill since her mother's passing.   Symptoms suggestive of HYPOTHYROIDISM:  fatigue Yes weight gain Yes cold intolerance  Yes constipation  Yes  Symptoms suggestive of HYPERTHYROIDISM:  weight loss  No heat intolerance No hyperdefecation  No palpitations  No anxiety yes, has anxiety  meds  Compressive symptoms:  dysphagia  No, sometimes feels like something is pressing on throat  dysphonia  No positional dyspnea (especially with simultaneous arms elevation)  No  Smokes  No On biotin  No Personal history of head/neck surgery/irradiation  No  Physical Exam  BP 124/80   Pulse 84   Ht 5' 5.5 (1.664 m)   Wt 189 lb (85.7 kg)   LMP 04/27/1992   SpO2 96%   BMI 30.97 kg/m  Constitutional: well developed, well nourished Head: normocephalic, atraumatic, no exophthalmos Eyes: sclera anicteric, no redness Neck: no thyromegaly, no thyroid  tenderness; + nodules palpated Lungs: normal respiratory effort Neurology: alert and oriented, no fine hand tremor, done previously: Pemberton's sign -ve Skin: dry, no appreciable rashes Musculoskeletal: no appreciable defects Psychiatric: normal mood and affect  Allergies Allergies  Allergen Reactions   Betadine [Povidone Iodine] Itching   Codeine    Other     Band aids-itchy, red rash    Current Medications Patient's Medications  New Prescriptions   No medications on file  Previous Medications   ACYCLOVIR  (ZOVIRAX ) 400 MG TABLET    TAKE 1 TABLET(400 MG) BY MOUTH THREE TIMES DAILY   ALBUTEROL  (VENTOLIN  HFA) 108 (90 BASE) MCG/ACT INHALER    Inhale 2 puffs into the lungs every 4 (four) hours as needed for wheezing or shortness of breath.   ALPRAZOLAM  (XANAX ) 0.5 MG TABLET    TAKE 1/2 TO 1 TABLET(0.25 TO 0.5 MG) BY MOUTH AT BEDTIME AS NEEDED FOR SLEEP   ASPIRIN  325 MG TABLET    Take 325 mg by mouth every other day.   BENAZEPRIL  (LOTENSIN ) 40 MG TABLET    Take 1 tablet (40 mg total) by mouth daily.   CALCIUM  PO    Take by mouth daily.   CITALOPRAM  (CELEXA ) 20 MG TABLET    Take 1 tablet (20 mg total) by mouth every other day.   COENZYME Q10 (COQ-10 PO)    Take by mouth.   ESTRADIOL  (ESTRACE ) 0.5 MG TABLET    Take 1 tablet (0.5 mg total) by mouth daily.   KRILL OIL PO    Take by mouth. Omega red krill oil   LEVOCETIRIZINE  (XYZAL ) 5 MG TABLET    Take 1 tablet (5 mg total) by mouth every evening.   MELOXICAM  (MOBIC ) 15 MG TABLET    Take 1 tablet (15 mg total) by mouth daily.   RED YEAST RICE EXTRACT (RED YEAST RICE PO)    Take by mouth daily.   VITAMIN C (ASCORBIC ACID) 500 MG TABLET    Take 1,000 mg by mouth 3 (three) times daily.  Modified Medications   No medications on file  Discontinued Medications   No medications on file    Past Medical History Past Medical History:  Diagnosis Date   Arthritis    Broken ankle 04/2014   will have surgery on 01/30/15-right ankle   COVID-19 11/24/2021   Elevated hemoglobin A1c 01/17/2015   6.0   Endometriosis    Herpes simplex type 1 infection    Hypertension    Migraine    h/o migraines, none since hysterectomy   PONV (postoperative  nausea and vomiting)    Situational depression    Varicose vein    surgery on right leg    Past Surgical History Past Surgical History:  Procedure Laterality Date   ABDOMINAL HYSTERECTOMY  1994   LAVH/RSO   ANKLE SURGERY Right 01/30/2015   BREAST BIOPSY Left    CESAREAN SECTION     CHOLECYSTECTOMY     COLON RESECTION  11/06   and lap LSO   CYSTECTOMY     HERNIA REPAIR  1982   HERNIA REPAIR  2003   and bladder repair   SHOULDER ARTHROSCOPY WITH ROTATOR CUFF REPAIR Left 11/18/2023   Procedure: ARTHROSCOPY, SHOULDER, WITH ROTATOR CUFF REPAIR;  Surgeon: Cristy Bonner DASEN, MD;  Location: [facility];  Service: Orthopedics;  Laterality: Left;   SHOULDER SURGERY Right 04/2017   Tendon repair     Family History family history includes Diabetes in her mother and sister; Hypertension in her mother and sister; Lung cancer in her father; Skin cancer in an other family member; Spina bifida in her daughter; Thyroid  nodules in her mother and sister.  Social History Social History   Socioeconomic History   Marital status: Married    Spouse name: Not on file   Number of children: 2   Years of education: Not on file    Highest education level: High school graduate  Occupational History   Not on file  Tobacco Use   Smoking status: Never    Passive exposure: Never   Smokeless tobacco: Never  Vaping Use   Vaping status: Never Used  Substance and Sexual Activity   Alcohol use: Yes    Alcohol/week: 2.0 - 3.0 standard drinks of alcohol    Types: 2 - 3 Glasses of wine per week    Comment: Occassionally   Drug use: No   Sexual activity: Yes    Partners: Male    Birth control/protection: Surgical    Comment: LAVH/RSO, then LSO  Other Topics Concern   Not on file  Social History Narrative   She has two daughters. One daughter lives across the street with her family. Her oldest daughter lives at home with her, she has spina bifida.    Social Drivers of Corporate investment banker Strain: Low Risk  (01/05/2023)   Overall Financial Resource Strain (CARDIA)    Difficulty of Paying Living Expenses: Not very hard  Food Insecurity: No Food Insecurity (01/05/2023)   Hunger Vital Sign    Worried About Running Out of Food in the Last Year: Never true    Ran Out of Food in the Last Year: Never true  Transportation Needs: No Transportation Needs (01/05/2023)   PRAPARE - Administrator, Civil Service (Medical): No    Lack of Transportation (Non-Medical): No  Physical Activity: Insufficiently Active (01/05/2023)   Exercise Vital Sign    Days of Exercise per Week: 2 days    Minutes of Exercise per Session: 30 min  Stress: No Stress Concern Present (01/05/2023)   [employer] of Occupational Health - Occupational Stress Questionnaire    Feeling of Stress : Only a little  Social Connections: Socially Integrated (01/05/2023)   Social Connection and Isolation Panel    Frequency of Communication with Friends and Family: More than three times a week    Frequency of Social Gatherings with Friends and Family: Twice a week    Attends Religious Services: More than 4 times per year    Active Member of [employer]  or Organizations: Yes    Attends Banker Meetings: 1 to 4 times per year    Marital Status: Married  Catering manager Violence: Not At Risk (01/05/2023)   Humiliation, Afraid, Rape, and Kick questionnaire    Fear of Current or Ex-Partner: No    Emotionally Abused: No    Physically Abused: No    Sexually Abused: No    Laboratory Investigations Lab Results  Component Value Date   TSH 0.285 (L) 07/26/2023   TSH 0.302 (L) 07/14/2023   TSH 0.59 10/19/2018   FREET4 1.0 10/27/2023     No results found for: TSI   No components found for: TRAB   Lab Results  Component Value Date   CHOL 256 (H) 01/05/2023   Lab Results  Component Value Date   HDL 77 01/05/2023   Lab Results  Component Value Date   LDLCALC 163 (H) 01/05/2023   Lab Results  Component Value Date   TRIG 92 01/05/2023   Lab Results  Component Value Date   CHOLHDL 3.3 01/05/2023   Lab Results  Component Value Date   CREATININE 0.60 07/14/2023   No results found for: GFR    Component Value Date/Time   NA 140 07/14/2023 0909   K 4.2 07/14/2023 0909   CL 105 07/14/2023 0909   CO2 22 07/14/2023 0909   GLUCOSE 98 07/14/2023 0909   GLUCOSE 98 03/31/2021 1026   BUN 19 07/14/2023 0909   CREATININE 0.60 07/14/2023 0909   CREATININE 0.61 03/31/2021 1026   CALCIUM  9.1 07/14/2023 0909   PROT 6.4 07/14/2023 0909   ALBUMIN 4.2 07/14/2023 0909   AST 14 07/14/2023 0909   ALT 9 07/14/2023 0909   ALKPHOS 57 07/14/2023 0909   BILITOT 0.3 07/14/2023 0909   GFRNONAA 94 09/23/2017 1421   GFRAA 109 09/23/2017 1421      Latest Ref Rng & Units 07/14/2023    9:09 AM 01/05/2023    9:19 AM 06/30/2022    9:12 AM  BMP  Glucose 70 - 99 mg/dL 98  896  78   BUN 8 - 27 mg/dL 19  18  16    Creatinine 0.57 - 1.00 mg/dL 9.39  9.40  9.37   BUN/Creat Ratio 12 - 28 32  31  26   Sodium 134 - 144 mmol/L 140  140  139   Potassium 3.5 - 5.2 mmol/L 4.2  4.4  4.4   Chloride 96 - 106 mmol/L 105  103  101   CO2 20 - 29  mmol/L 22  25  23    Calcium  8.7 - 10.3 mg/dL 9.1  9.0  9.3        Component Value Date/Time   WBC 4.4 07/14/2023 0909   WBC 5.4 03/31/2021 1026   RBC 4.94 07/14/2023 0909   RBC 4.85 03/31/2021 1026   HGB 13.3 07/14/2023 0909   HGB 14.6 12/21/2013 0929   HCT 41.2 07/14/2023 0909   PLT 235 07/14/2023 0909   MCV 83 07/14/2023 0909   MCH 26.9 07/14/2023 0909   MCH 27.4 03/31/2021 1026   MCHC 32.3 07/14/2023 0909   MCHC 32.4 03/31/2021 1026   RDW 13.6 07/14/2023 0909   LYMPHSABS 1.2 07/14/2023 0909   EOSABS 0.1 07/14/2023 0909   BASOSABS 0.1 07/14/2023 0909      Parts of this note may have been dictated using voice recognition software. There may be variances in spelling and vocabulary which are unintentional. Not all errors are proofread. Please  notify the dino if any discrepancies are noted or if the meaning of any statement is not clear.

## 2023-12-23 DIAGNOSIS — Z9889 Other specified postprocedural states: Secondary | ICD-10-CM | POA: Diagnosis not present

## 2023-12-23 DIAGNOSIS — R6889 Other general symptoms and signs: Secondary | ICD-10-CM | POA: Diagnosis not present

## 2023-12-23 DIAGNOSIS — M25612 Stiffness of left shoulder, not elsewhere classified: Secondary | ICD-10-CM | POA: Diagnosis not present

## 2023-12-23 DIAGNOSIS — R531 Weakness: Secondary | ICD-10-CM | POA: Diagnosis not present

## 2023-12-28 DIAGNOSIS — Z9889 Other specified postprocedural states: Secondary | ICD-10-CM | POA: Diagnosis not present

## 2023-12-28 DIAGNOSIS — R531 Weakness: Secondary | ICD-10-CM | POA: Diagnosis not present

## 2023-12-28 DIAGNOSIS — M25612 Stiffness of left shoulder, not elsewhere classified: Secondary | ICD-10-CM | POA: Diagnosis not present

## 2023-12-28 DIAGNOSIS — R6889 Other general symptoms and signs: Secondary | ICD-10-CM | POA: Diagnosis not present

## 2023-12-31 DIAGNOSIS — M25612 Stiffness of left shoulder, not elsewhere classified: Secondary | ICD-10-CM | POA: Diagnosis not present

## 2023-12-31 DIAGNOSIS — R29898 Other symptoms and signs involving the musculoskeletal system: Secondary | ICD-10-CM | POA: Diagnosis not present

## 2023-12-31 DIAGNOSIS — Z9889 Other specified postprocedural states: Secondary | ICD-10-CM | POA: Diagnosis not present

## 2023-12-31 DIAGNOSIS — R6889 Other general symptoms and signs: Secondary | ICD-10-CM | POA: Diagnosis not present

## 2023-12-31 NOTE — Progress Notes (Signed)
 Flu shot: wants to wait until end sept.    Catheline Doing, DNP, AGNP-c [facility] [address] [place], [place] [address] Main Office [phone] VISIT TYPE: CPE on 01/03/2024 Today's Vitals   01/03/24 1337  BP: 130/82  Pulse: 84  Weight: 187 lb 9.6 oz (85.1 kg)  Height: 5' 4 (1.626 m)   Body mass index is 32.2 kg/m. BP 130/82   Pulse 84   Ht 5' 4 (1.626 m)   Wt 187 lb 9.6 oz (85.1 kg)   LMP 04/27/1992   BMI 32.20 kg/m   Subjective:    Patient ID: [name], female    DOB: 19-Sep-1955, 68 y.o.   MRN: [id number]  HPI:  History of Present Illness [name] is a 68 year old female who presents for follow-up regarding shoulder surgery recovery and thyroid  nodules.  She is six weeks post-operative from shoulder surgery for a bicep tear, rotator cuff tear, and bone spurs. She was recently taken out of the sling but continues to experience significant soreness, especially in public settings, necessitating wearing the sling to avoid accidental bumps. The pain was constant prior to surgery and has been particularly sore today. She is undergoing physical therapy twice a week, focusing on the bicep tear, and has started isometric exercises, which cause soreness. She has been taking Motrin for pain relief and was previously on meloxicam  for inflammation, which she has now stopped. She reports improved sleep since her shoulder surgery, although she had some restless nights initially due to pain.  She has a history of thyroid  nodules, with an ultrasound revealing five nodules, three of which require biopsy due to their size. There is a strong family history of thyroid  issues, including her mother having Hashimoto's disease and her sisters having nodules. She experiences symptoms such as a feeling of fullness in her throat and pressure on her neck. She is scheduled for biopsies of the nodules, with only two being done at a time. She expresses frustration with her  previous endocrinologist, who was dismissive of her symptoms and did not promptly address the ultrasound findings.  She takes citalopram  every other day, estradiol  daily, and manages her allergies with Flonase , levocetirizine, and Pataday drops. She has a history of arthritis, which was managed with meloxicam , and continues to monitor her blood pressure with medication. No chest pain, shortness of breath, or swelling in her feet or ankles. Her social history includes living in [place], having a supportive church community, and a daughter who homeschools her children.  Pertinent items are noted in HPI.  Most Recent Depression Screen:     01/03/2024    1:36 PM 07/14/2023    8:17 AM 04/22/2023    9:30 AM 01/05/2023    8:16 AM 04/03/2022    9:21 AM  Depression screen PHQ 2/9  Decreased Interest 0 0 0 0 0  Down, Depressed, Hopeless 0 0 0 0 0  PHQ - 2 Score 0 0 0 0 0   Most Recent Anxiety Screen:     06/30/2021   12:46 PM 05/19/2021    2:49 PM 03/31/2021   10:27 AM 01/17/2021    3:16 PM  GAD 7 : Generalized Anxiety Score  Nervous, Anxious, on Edge 1 1 1 1   Control/stop worrying 1 1 1 1   Worry too much - different things 1 1 1 1   Trouble relaxing 1 1 2 2   Restless 1 0 1 1  Easily annoyed or irritable 0 0 0 0  Afraid - awful might happen 0 0 1 1  Total GAD 7 Score 5 4 7 7   Anxiety Difficulty Not difficult at all Not difficult at all Somewhat difficult Somewhat difficult   Most Recent Fall Screen:    01/03/2024    1:36 PM 07/14/2023    8:16 AM 04/22/2023    9:29 AM 01/05/2023    8:15 AM 11/24/2021    2:45 PM  Fall Risk   Falls in the past year? 0 0 0 0 0  Number falls in past yr: 0 0 0 0 0  Injury with Fall? 0 0 0 0 0  Risk for fall due to : No Fall Risks No Fall Risks  No Fall Risks No Fall Risks  Follow up Falls evaluation completed Falls evaluation completed  Falls evaluation completed Falls evaluation completed      Data saved with a previous flowsheet row definition    Past  medical history, surgical history, medications, allergies, family history and social history reviewed with patient today and changes made to appropriate areas of the chart.  Past Medical History:  Past Medical History:  Diagnosis Date   Arthritis    Broken ankle 04/2014   will have surgery on 01/30/15-right ankle   COVID-19 11/24/2021   Elevated hemoglobin A1c 01/17/2015   6.0   Endometriosis    Herpes simplex type 1 infection    Hypertension    Migraine    h/o migraines, none since hysterectomy   PONV (postoperative nausea and vomiting)    Situational depression    Varicose vein    surgery on right leg   Medications:  Current Outpatient Medications on File Prior to Visit  Medication Sig   albuterol  (VENTOLIN  HFA) 108 (90 Base) MCG/ACT inhaler Inhale 2 puffs into the lungs every 4 (four) hours as needed for wheezing or shortness of breath.   ALPRAZolam  (XANAX ) 0.5 MG tablet TAKE 1/2 TO 1 TABLET(0.25 TO 0.5 MG) BY MOUTH AT BEDTIME AS NEEDED FOR SLEEP   aspirin  325 MG tablet Take 325 mg by mouth every other day.   CALCIUM  PO Take by mouth daily.   Coenzyme Q10 (COQ-10 PO) Take by mouth.   KRILL OIL PO Take by mouth. Omega red krill oil   levocetirizine (XYZAL ) 5 MG tablet Take 1 tablet (5 mg total) by mouth every evening.   Red Yeast Rice Extract (RED YEAST RICE PO) Take by mouth daily.   vitamin C (ASCORBIC ACID) 500 MG tablet Take 1,000 mg by mouth 3 (three) times daily.   acyclovir  (ZOVIRAX ) 400 MG tablet TAKE 1 TABLET(400 MG) BY MOUTH THREE TIMES DAILY (Patient not taking: Reported on 01/03/2024)   meloxicam  (MOBIC ) 15 MG tablet Take 1 tablet (15 mg total) by mouth daily. (Patient not taking: Reported on 01/03/2024)   No current facility-administered medications on file prior to visit.   Surgical History:  Past Surgical History:  Procedure Laterality Date   ABDOMINAL HYSTERECTOMY  1994   LAVH/RSO   ANKLE SURGERY Right 01/30/2015   BREAST BIOPSY Left    CESAREAN SECTION      CHOLECYSTECTOMY     COLON RESECTION  11/06   and lap LSO   CYSTECTOMY     HERNIA REPAIR  1982   HERNIA REPAIR  2003   and bladder repair   SHOULDER ARTHROSCOPY WITH ROTATOR CUFF REPAIR Left 11/18/2023   Procedure: ARTHROSCOPY, SHOULDER, WITH ROTATOR CUFF REPAIR;  Surgeon: Cristy Bonner DASEN, MD;  Location: [facility];  Service: Orthopedics;  Laterality: Left;   SHOULDER SURGERY Right 04/2017   Tendon repair    Allergies:  Allergies  Allergen Reactions   Betadine [Povidone Iodine] Itching   Codeine    Other     Band aids-itchy, red rash   Family History:  Family History  Problem Relation Age of Onset   Skin cancer Other    Lung cancer Father    Spina bifida Daughter        and hydrocephalia   Diabetes Mother    Hypertension Mother    Thyroid  nodules Mother        being watched   Diabetes Sister        x 3   Hypertension Sister    Thyroid  nodules Sister        Objective:    BP 130/82   Pulse 84   Ht 5' 4 (1.626 m)   Wt 187 lb 9.6 oz (85.1 kg)   LMP 04/27/1992   BMI 32.20 kg/m   Wt Readings from Last 3 Encounters:  01/03/24 187 lb 9.6 oz (85.1 kg)  12/21/23 189 lb (85.7 kg)  11/18/23 186 lb 11.7 oz (84.7 kg)    Physical Exam Vitals and nursing note reviewed.  Constitutional:      General: She is not in acute distress.    Appearance: Normal appearance.  HENT:     Head: Normocephalic and atraumatic.     Right Ear: Hearing, tympanic membrane, ear canal and external ear normal.     Left Ear: Hearing, tympanic membrane, ear canal and external ear normal.     Nose: Nose normal.     Right Sinus: No maxillary sinus tenderness or frontal sinus tenderness.     Left Sinus: No maxillary sinus tenderness or frontal sinus tenderness.     Mouth/Throat:     Lips: Pink.     Mouth: Mucous membranes are moist.     Pharynx: Oropharynx is clear.  Eyes:     General: Lids are normal. Vision grossly intact.     Extraocular Movements: Extraocular movements intact.      Conjunctiva/sclera: Conjunctivae normal.     Pupils: Pupils are equal, round, and reactive to light.     Funduscopic exam:    Right eye: Red reflex present.        Left eye: Red reflex present.    Visual Fields: Right eye visual fields normal and left eye visual fields normal.  Neck:     Thyroid : No thyromegaly.     Vascular: No carotid bruit.     Comments: Thyroid  nodules Cardiovascular:     Rate and Rhythm: Normal rate and regular rhythm.     Chest Wall: PMI is not displaced.     Pulses: Normal pulses.          Dorsalis pedis pulses are 2+ on the right side and 2+ on the left side.       Posterior tibial pulses are 2+ on the right side and 2+ on the left side.     Heart sounds: Normal heart sounds. No murmur heard. Pulmonary:     Effort: Pulmonary effort is normal. No respiratory distress.     Breath sounds: Normal breath sounds.  Abdominal:     General: Abdomen is flat. Bowel sounds are normal. There is no distension.     Palpations: Abdomen is soft. There is no hepatomegaly, splenomegaly or mass.     Tenderness: There is no abdominal tenderness. There is no  right CVA tenderness, left CVA tenderness, guarding or rebound.  Musculoskeletal:        General: Normal range of motion.     Cervical back: Full passive range of motion without pain, normal range of motion and neck supple. No tenderness.     Right lower leg: No edema.     Left lower leg: No edema.  Feet:     Left foot:     Toenail Condition: Left toenails are normal.  Lymphadenopathy:     Cervical: No cervical adenopathy.     Upper Body:     Right upper body: No supraclavicular adenopathy.     Left upper body: No supraclavicular adenopathy.  Skin:    General: Skin is warm and dry.     Capillary Refill: Capillary refill takes less than 2 seconds.     Nails: There is no clubbing.  Neurological:     General: No focal deficit present.     Mental Status: She is alert and oriented to person, place, and time.      GCS: GCS eye subscore is 4. GCS verbal subscore is 5. GCS motor subscore is 6.     Sensory: Sensation is intact.     Motor: Motor function is intact.     Coordination: Coordination is intact.     Gait: Gait is intact.     Deep Tendon Reflexes: Reflexes are normal and symmetric.  Psychiatric:        Attention and Perception: Attention normal.        Mood and Affect: Mood normal.        Speech: Speech normal.        Behavior: Behavior normal. Behavior is cooperative.        Thought Content: Thought content normal.        Cognition and Memory: Cognition and memory normal.        Judgment: Judgment normal.      Results for orders placed or performed in visit on 10/27/23  TSH + Haley T4   Collection Time: 10/27/23 10:14 AM  Result Value Ref Range   TSH W/REFLEX TO FT4 0.32 (L) 0.40 - 4.50 mIU/L  T4, Haley   Collection Time: 10/27/23 10:14 AM  Result Value Ref Range   Haley T4 1.0 0.8 - 1.8 ng/dL       Assessment & Plan:   Problem List Items Addressed This Visit     S/P arthroscopy of right shoulder   Postoperative recovery from left shoulder surgery for rotator cuff and biceps tendon repair with soreness, especially in public, requiring a sling. Undergoing physical therapy twice a week, focusing on bicep tear recovery. Pain managed with Motrin; meloxicam  was discontinued after release from care. Advised against treadmill use to prevent falls. - Continue physical therapy twice a week - Use Motrin as needed for pain management - Avoid treadmill use to prevent falls      Hypertension   Hypertension with current medication regimen. - Refill blood pressure medication      Relevant Medications   benazepril  (LOTENSIN ) 40 MG tablet   Other Relevant Orders   CMP14+EGFR   CBC with Differential/Platelet   Lipid panel   Hemoglobin A1c   VITAMIN D  25 Hydroxy (Vit-D Deficiency, Fractures)   Allergic rhinitis   Allergic rhinitis symptoms managed with Flonase , levocetirizine, and  Pataday drops. Symptoms include nasal itching and irritation, consistent with ragweed season. - Continue Flonase , levocetirizine, and Pataday drops as needed      Hyperlipidemia  Chronic. Lipids monitored today. No alarm symptoms present at this time. LDL Goal < 100. Controlled with red yeast rice and krill oil.  Continue current lipid-lowering therapy. Labs due at next visit Recommend Treatment of hypertension  and Strict diet and exercise Diet and exercise recommendations provided.  Follow-up in 6months         Relevant Medications   benazepril  (LOTENSIN ) 40 MG tablet   Other Relevant Orders   CMP14+EGFR   CBC with Differential/Platelet   Lipid panel   Hemoglobin A1c   VITAMIN D  25 Hydroxy (Vit-D Deficiency, Fractures)   Situational depression   Depression managed with citalopram , currently taking 20 mg every other day, effectively maintaining mood stability. Continue current treatment.       Relevant Medications   citalopram  (CELEXA ) 20 MG tablet   Other Relevant Orders   CMP14+EGFR   CBC with Differential/Platelet   Lipid panel   Hemoglobin A1c   VITAMIN D  25 Hydroxy (Vit-D Deficiency, Fractures)   Prediabetes   Repeat labs today. Currently managed with diet and exercise.       Relevant Orders   CMP14+EGFR   CBC with Differential/Platelet   Lipid panel   Hemoglobin A1c   VITAMIN D  25 Hydroxy (Vit-D Deficiency, Fractures)   BMI 32.0-32.9,adult   Working on diet and exercise for management. Unable to exercise as much lately due to shoulder surgery, but will be increasing activity as she heals.       Menopausal syndrome on hormone replacement therapy   Managed with estrace  by Dr. Cleotilde. Will send refill for her today. No concerns.       Relevant Medications   estradiol  (ESTRACE ) 0.5 MG tablet   Arthritis of left ankle   Degenerative arthritis with previous swelling and pain managed effectively with meloxicam . Reports improvement in symptoms with  medication. - Continue meloxicam  as needed for arthritis pain      Encounter for annual physical exam - Primary   CPE completed today. Review of HM activities and recommendations discussed and provided on AVS. Anticipatory guidance, diet, and exercise recommendations provided. Medications, allergies, and hx reviewed and updated as necessary. Orders placed as listed below.  Plan: - Labs ordered. Will make changes as necessary based on results.  - I will review these results and send recommendations via MyChart or a telephone call.  - F/U with CPE in 1 year or sooner for acute/chronic health needs as directed.        Multiple thyroid  nodules   Thyroid  nodules identified on ultrasound, with three large nodules requiring biopsy. Biopsy scheduled for Friday. Reports symptoms of fullness in the throat and family history of thyroid  issues. Dissatisfaction with previous endocrinologist's management and considering referral to Dr. Lenis. Discussed potential outcomes of biopsy and possible need for thyroidectomy if malignancy is found. She is agreeable to thyroidectomy if necessary. - Monitor biopsy results - Consider referral to Dr. Lenis for further evaluation and management if needed      Vitamin D  deficiency   Relevant Orders   CMP14+EGFR   CBC with Differential/Platelet   Lipid panel   Hemoglobin A1c   VITAMIN D  25 Hydroxy (Vit-D Deficiency, Fractures)   Situational anxiety   Relevant Medications   citalopram  (CELEXA ) 20 MG tablet   Other Relevant Orders   CMP14+EGFR   CBC with Differential/Platelet   Lipid panel   Hemoglobin A1c   VITAMIN D  25 Hydroxy (Vit-D Deficiency, Fractures)   Osteopenia after menopause   Relevant Orders   CMP14+EGFR  CBC with Differential/Platelet   Lipid panel   Hemoglobin A1c   VITAMIN D  25 Hydroxy (Vit-D Deficiency, Fractures)    Follow up plan: No follow-ups on file.  NEXT PREVENTATIVE PHYSICAL DUE IN 1 YEAR.  PATIENT COUNSELING PROVIDED FOR ALL  ADULT PATIENTS: A well balanced diet low in saturated fats, cholesterol, and moderation in carbohydrates.  This can be as simple as monitoring portion sizes and cutting back on sugary beverages such as soda and juice to start with.    Daily water consumption of at least 64 ounces.  Physical activity at least 180 minutes per week.  If just starting out, start 10 minutes a day and work your way up.   This can be as simple as taking the stairs instead of the elevator and walking 2-3 laps around the office  purposefully every day.   STD protection, partner selection, and regular testing if high risk.  Limited consumption of alcoholic beverages if alcohol is consumed. For men, I recommend no more than 14 alcoholic beverages per week, spread out throughout the week (max 2 per day). Avoid binge drinking or consuming large quantities of alcohol in one setting.  Please let me know if you feel you may need help with reduction or quitting alcohol consumption.   Avoidance of nicotine, if used. Please let me know if you feel you may need help with reduction or quitting nicotine use.   Daily mental health attention. This can be in the form of 5 minute daily meditation, prayer, journaling, yoga, reflection, etc.  Purposeful attention to your emotions and mental state can significantly improve your overall wellbeing  and  Health.  Please know that I am here to help you with all of your health care goals and am happy to work with you to find a solution that works best for you.  The greatest advice I have received with any changes in life are to take it one step at a time, that even means if all you can focus on is the next 60 seconds, then do that and celebrate your victories.  With any changes in life, you will have set backs, and that is OK. The important thing to remember is, if you have a set back, it is not a failure, it is an opportunity to try again! Screening Testing Mammogram Every 1 -2 years  based on history and risk factors Starting at age 67 Pap Smear Ages 21-39 every 3 years Ages 73-65 every 5 years with HPV testing More frequent testing may be required based on results and history Colon Cancer Screening Every 1-10 years based on test performed, risk factors, and history Starting at age 24 Bone Density Screening Every 2-10 years based on history Starting at age 69 for women Recommendations for men differ based on medication usage, history, and risk factors AAA Screening One time ultrasound Men 13-25 years old who have every smoked Lung Cancer Screening Low Dose Lung CT every 12 months Age 37-80 years with a 30 pack-year smoking history who still smoke or who have quit within the last 15 years   Screening Labs Routine  Labs: Complete Blood Count (CBC), Complete Metabolic Panel (CMP), Cholesterol (Lipid Panel) Every 6-12 months based on history and medications May be recommended more frequently based on current conditions or previous results Hemoglobin A1c Lab Every 3-12 months based on history and previous results Starting at age 99 or earlier with diagnosis of diabetes, high cholesterol, BMI >26, and/or risk factors Frequent monitoring for  patients with diabetes to ensure blood sugar control Thyroid  Panel (TSH) Every 6 months based on history, symptoms, and risk factors May be repeated more often if on medication HIV One time testing for all patients 54 and older May be repeated more frequently for patients with increased risk factors or exposure Hepatitis C One time testing for all patients 42 and older May be repeated more frequently for patients with increased risk factors or exposure Gonorrhea, Chlamydia Every 12 months for all sexually active persons 13-24 years Additional monitoring may be recommended for those who are considered high risk or who have symptoms Every 12 months for any woman on birth control, regardless of sexual activity PSA Men 84-17  years old with risk factors Additional screening may be recommended from age 65-69 based on risk factors, symptoms, and history  Vaccine Recommendations Tetanus Booster All adults every 10 years Flu Vaccine All patients 6 months and older every year COVID Vaccine All patients 12 years and older Initial dosing with booster May recommend additional booster based on age and health history HPV Vaccine 2 doses all patients age 88-26 Dosing may be considered for patients over 26 Shingles Vaccine (Shingrix) 2 doses all adults 55 years and older Pneumonia (Pneumovax 23) All adults 65 years and older May recommend earlier dosing based on health history One year apart from Prevnar 13 Pneumonia (Prevnar 43) All adults 65 years and older Dosed 1 year after Pneumovax 23 Pneumonia (Prevnar 20) One time alternative to the two dosing of 13 and 23 For all adults with initial dose of 23, 20 is recommended 1 year later For all adults with initial dose of 13, 23 is still recommended as second option 1 year later

## 2024-01-03 ENCOUNTER — Encounter: Payer: Self-pay | Admitting: Nurse Practitioner

## 2024-01-03 ENCOUNTER — Ambulatory Visit (INDEPENDENT_AMBULATORY_CARE_PROVIDER_SITE_OTHER): Admitting: Nurse Practitioner

## 2024-01-03 VITALS — BP 130/82 | HR 84 | Ht 64.0 in | Wt 187.6 lb

## 2024-01-03 DIAGNOSIS — E785 Hyperlipidemia, unspecified: Secondary | ICD-10-CM

## 2024-01-03 DIAGNOSIS — F418 Other specified anxiety disorders: Secondary | ICD-10-CM

## 2024-01-03 DIAGNOSIS — F4321 Adjustment disorder with depressed mood: Secondary | ICD-10-CM

## 2024-01-03 DIAGNOSIS — N951 Menopausal and female climacteric states: Secondary | ICD-10-CM

## 2024-01-03 DIAGNOSIS — M19072 Primary osteoarthritis, left ankle and foot: Secondary | ICD-10-CM | POA: Diagnosis not present

## 2024-01-03 DIAGNOSIS — M8580 Other specified disorders of bone density and structure, unspecified site: Secondary | ICD-10-CM

## 2024-01-03 DIAGNOSIS — I10 Essential (primary) hypertension: Secondary | ICD-10-CM | POA: Diagnosis not present

## 2024-01-03 DIAGNOSIS — J301 Allergic rhinitis due to pollen: Secondary | ICD-10-CM

## 2024-01-03 DIAGNOSIS — E559 Vitamin D deficiency, unspecified: Secondary | ICD-10-CM | POA: Diagnosis not present

## 2024-01-03 DIAGNOSIS — Z6832 Body mass index (BMI) 32.0-32.9, adult: Secondary | ICD-10-CM

## 2024-01-03 DIAGNOSIS — Z6830 Body mass index (BMI) 30.0-30.9, adult: Secondary | ICD-10-CM | POA: Diagnosis not present

## 2024-01-03 DIAGNOSIS — Z9889 Other specified postprocedural states: Secondary | ICD-10-CM

## 2024-01-03 DIAGNOSIS — R7303 Prediabetes: Secondary | ICD-10-CM

## 2024-01-03 DIAGNOSIS — Z780 Asymptomatic menopausal state: Secondary | ICD-10-CM

## 2024-01-03 DIAGNOSIS — E042 Nontoxic multinodular goiter: Secondary | ICD-10-CM | POA: Diagnosis not present

## 2024-01-03 DIAGNOSIS — Z0000 Encounter for general adult medical examination without abnormal findings: Secondary | ICD-10-CM | POA: Diagnosis not present

## 2024-01-03 DIAGNOSIS — K219 Gastro-esophageal reflux disease without esophagitis: Secondary | ICD-10-CM | POA: Diagnosis not present

## 2024-01-03 LAB — LIPID PANEL

## 2024-01-03 MED ORDER — CITALOPRAM HYDROBROMIDE 20 MG PO TABS: 20.0000 mg | ORAL_TABLET | ORAL | 3 refills | Status: AC

## 2024-01-03 MED ORDER — BENAZEPRIL HCL 40 MG PO TABS: 40.0000 mg | ORAL_TABLET | Freq: Every day | ORAL | 3 refills | Status: AC

## 2024-01-03 MED ORDER — ESTRADIOL 0.5 MG PO TABS: 0.5000 mg | ORAL_TABLET | Freq: Every day | ORAL | 3 refills | Status: DC

## 2024-01-03 NOTE — Assessment & Plan Note (Signed)
 Depression managed with citalopram , currently taking 20 mg every other day, effectively maintaining mood stability. Continue current treatment.

## 2024-01-03 NOTE — Assessment & Plan Note (Signed)
 Repeat labs today. Currently managed with diet and exercise.

## 2024-01-03 NOTE — Assessment & Plan Note (Signed)
 Chronic. Lipids monitored today. No alarm symptoms present at this time. LDL Goal < 100. Controlled with red yeast rice and krill oil.  Continue current lipid-lowering therapy. Labs due at next visit Recommend Treatment of hypertension  and Strict diet and exercise Diet and exercise recommendations provided.  Follow-up in 6months

## 2024-01-03 NOTE — Assessment & Plan Note (Signed)
 Hypertension with current medication regimen. - Refill blood pressure medication

## 2024-01-03 NOTE — Assessment & Plan Note (Signed)
 Postoperative recovery from left shoulder surgery for rotator cuff and biceps tendon repair with soreness, especially in public, requiring a sling. Undergoing physical therapy twice a week, focusing on bicep tear recovery. Pain managed with Motrin; meloxicam  was discontinued after release from care. Advised against treadmill use to prevent falls. - Continue physical therapy twice a week - Use Motrin as needed for pain management - Avoid treadmill use to prevent falls

## 2024-01-03 NOTE — Assessment & Plan Note (Signed)
 Degenerative arthritis with previous swelling and pain managed effectively with meloxicam . Reports improvement in symptoms with medication. - Continue meloxicam  as needed for arthritis pain

## 2024-01-03 NOTE — Assessment & Plan Note (Signed)
 Managed with estrace  by Dr. Cleotilde. Will send refill for her today. No concerns.

## 2024-01-03 NOTE — Patient Instructions (Signed)
 Please let me know if you need the referral to Dr. Lenis and I will be happy to place this for you.  I am so glad you are doing so well from the shoulder surgery!!  I will watch for the biopsy results. Fingers crossed!!

## 2024-01-03 NOTE — Assessment & Plan Note (Signed)
 Thyroid  nodules identified on ultrasound, with three large nodules requiring biopsy. Biopsy scheduled for Friday. Reports symptoms of fullness in the throat and family history of thyroid  issues. Dissatisfaction with previous endocrinologist's management and considering referral to Dr. Lenis. Discussed potential outcomes of biopsy and possible need for thyroidectomy if malignancy is found. She is agreeable to thyroidectomy if necessary. - Monitor biopsy results - Consider referral to Dr. Lenis for further evaluation and management if needed

## 2024-01-03 NOTE — Assessment & Plan Note (Signed)
 Allergic rhinitis symptoms managed with Flonase , levocetirizine, and Pataday drops. Symptoms include nasal itching and irritation, consistent with ragweed season. - Continue Flonase , levocetirizine, and Pataday drops as needed

## 2024-01-03 NOTE — Assessment & Plan Note (Signed)
 Working on diet and exercise for management. Unable to exercise as much lately due to shoulder surgery, but will be increasing activity as she heals.

## 2024-01-03 NOTE — Assessment & Plan Note (Signed)

## 2024-01-04 ENCOUNTER — Other Ambulatory Visit: Payer: Self-pay | Admitting: Nurse Practitioner

## 2024-01-04 DIAGNOSIS — I10 Essential (primary) hypertension: Secondary | ICD-10-CM

## 2024-01-04 DIAGNOSIS — M25612 Stiffness of left shoulder, not elsewhere classified: Secondary | ICD-10-CM | POA: Diagnosis not present

## 2024-01-04 DIAGNOSIS — R29898 Other symptoms and signs involving the musculoskeletal system: Secondary | ICD-10-CM | POA: Diagnosis not present

## 2024-01-04 DIAGNOSIS — Z9889 Other specified postprocedural states: Secondary | ICD-10-CM | POA: Diagnosis not present

## 2024-01-04 DIAGNOSIS — R6889 Other general symptoms and signs: Secondary | ICD-10-CM | POA: Diagnosis not present

## 2024-01-04 LAB — HEMOGLOBIN A1C
Est. average glucose Bld gHb Est-mCnc: 126 mg/dL
Hgb A1c MFr Bld: 6 — AB (ref 4.8–5.6)

## 2024-01-04 LAB — CMP14+EGFR
ALT: 12 IU/L (ref 0–32)
AST: 15 IU/L (ref 0–40)
Albumin: 4.6 g/dL (ref 3.9–4.9)
Alkaline Phosphatase: 71 IU/L (ref 44–121)
BUN/Creatinine Ratio: 19 (ref 12–28)
BUN: 12 mg/dL (ref 8–27)
Bilirubin Total: 0.3 mg/dL (ref 0.0–1.2)
CO2: 22 mmol/L (ref 20–29)
Calcium: 9.5 mg/dL (ref 8.7–10.3)
Chloride: 103 mmol/L (ref 96–106)
Creatinine, Ser: 0.63 mg/dL (ref 0.57–1.00)
Globulin, Total: 2.1 g/dL (ref 1.5–4.5)
Glucose: 92 mg/dL (ref 70–99)
Potassium: 4.4 mmol/L (ref 3.5–5.2)
Sodium: 139 mmol/L (ref 134–144)
Total Protein: 6.7 g/dL (ref 6.0–8.5)
eGFR: 97 mL/min/1.73 (ref >59)

## 2024-01-04 LAB — LIPID PANEL
Cholesterol, Total: 281 mg/dL — AB (ref 100–199)
HDL: 71 mg/dL (ref >39)
LDL CALC COMMENT:: 4 ratio (ref 0.0–4.4)
LDL Chol Calc (NIH): 186 mg/dL — AB (ref 0–99)
Triglycerides: 135 mg/dL (ref 0–149)
VLDL Cholesterol Cal: 24 mg/dL (ref 5–40)

## 2024-01-04 LAB — CBC WITH DIFFERENTIAL/PLATELET
Basophils Absolute: 0.1 x10E3/uL (ref 0.0–0.2)
Basos: 1 %
EOS (ABSOLUTE): 0.1 x10E3/uL (ref 0.0–0.4)
Eos: 2 %
Hematocrit: 44.1 % (ref 34.0–46.6)
Hemoglobin: 13.9 g/dL (ref 11.1–15.9)
Immature Grans (Abs): 0 x10E3/uL (ref 0.0–0.1)
Immature Granulocytes: 0 %
Lymphocytes Absolute: 1.6 x10E3/uL (ref 0.7–3.1)
Lymphs: 23 %
MCH: 26.8 pg (ref 26.6–33.0)
MCHC: 31.5 g/dL (ref 31.5–35.7)
MCV: 85 fL (ref 79–97)
Monocytes Absolute: 0.7 x10E3/uL (ref 0.1–0.9)
Monocytes: 10 %
Neutrophils Absolute: 4.5 x10E3/uL (ref 1.4–7.0)
Neutrophils: 64 %
Platelets: 255 x10E3/uL (ref 150–450)
RBC: 5.19 x10E6/uL (ref 3.77–5.28)
RDW: 13.7 % (ref 11.7–15.4)
WBC: 6.9 x10E3/uL (ref 3.4–10.8)

## 2024-01-04 LAB — VITAMIN D 25 HYDROXY (VIT D DEFICIENCY, FRACTURES): Vit D, 25-Hydroxy: 26.6 ng/mL — AB (ref 30.0–100.0)

## 2024-01-05 ENCOUNTER — Ambulatory Visit: Payer: Self-pay | Admitting: Nurse Practitioner

## 2024-01-07 ENCOUNTER — Ambulatory Visit
Admission: RE | Admit: 2024-01-07 | Discharge: 2024-01-07 | Disposition: A | Discharge status: Home / Self Care | Source: Ambulatory Visit | Attending: "Endocrinology | Admitting: "Endocrinology

## 2024-01-07 ENCOUNTER — Other Ambulatory Visit (HOSPITAL_COMMUNITY)
Admission: RE | Admit: 2024-01-07 | Discharge: 2024-01-07 | Disposition: A | Discharge status: Home / Self Care | Source: Ambulatory Visit | Attending: Radiology | Admitting: Radiology

## 2024-01-07 ENCOUNTER — Other Ambulatory Visit: Payer: Self-pay

## 2024-01-07 DIAGNOSIS — R6889 Other general symptoms and signs: Secondary | ICD-10-CM | POA: Diagnosis not present

## 2024-01-07 DIAGNOSIS — R29898 Other symptoms and signs involving the musculoskeletal system: Secondary | ICD-10-CM | POA: Diagnosis not present

## 2024-01-07 DIAGNOSIS — E041 Nontoxic single thyroid nodule: Secondary | ICD-10-CM

## 2024-01-07 DIAGNOSIS — E042 Nontoxic multinodular goiter: Secondary | ICD-10-CM

## 2024-01-07 DIAGNOSIS — Z9889 Other specified postprocedural states: Secondary | ICD-10-CM | POA: Diagnosis not present

## 2024-01-07 DIAGNOSIS — M25612 Stiffness of left shoulder, not elsewhere classified: Secondary | ICD-10-CM | POA: Diagnosis not present

## 2024-01-07 MED ORDER — VITAMIN D (ERGOCALCIFEROL) 1.25 MG (50000 UNIT) PO CAPS: 50000.0000 [IU] | ORAL_CAPSULE | ORAL | 0 refills | Status: AC

## 2024-01-10 DIAGNOSIS — Z9889 Other specified postprocedural states: Secondary | ICD-10-CM | POA: Diagnosis not present

## 2024-01-10 DIAGNOSIS — M25612 Stiffness of left shoulder, not elsewhere classified: Secondary | ICD-10-CM | POA: Diagnosis not present

## 2024-01-10 DIAGNOSIS — R6889 Other general symptoms and signs: Secondary | ICD-10-CM | POA: Diagnosis not present

## 2024-01-10 DIAGNOSIS — R531 Weakness: Secondary | ICD-10-CM | POA: Diagnosis not present

## 2024-01-12 DIAGNOSIS — M25612 Stiffness of left shoulder, not elsewhere classified: Secondary | ICD-10-CM | POA: Diagnosis not present

## 2024-01-12 DIAGNOSIS — R531 Weakness: Secondary | ICD-10-CM | POA: Diagnosis not present

## 2024-01-12 DIAGNOSIS — R6889 Other general symptoms and signs: Secondary | ICD-10-CM | POA: Diagnosis not present

## 2024-01-12 DIAGNOSIS — Z9889 Other specified postprocedural states: Secondary | ICD-10-CM | POA: Diagnosis not present

## 2024-01-13 LAB — CYTOLOGY - NON PAP

## 2024-01-17 DIAGNOSIS — Z9889 Other specified postprocedural states: Secondary | ICD-10-CM | POA: Diagnosis not present

## 2024-01-17 DIAGNOSIS — M25612 Stiffness of left shoulder, not elsewhere classified: Secondary | ICD-10-CM | POA: Diagnosis not present

## 2024-01-17 DIAGNOSIS — R531 Weakness: Secondary | ICD-10-CM | POA: Diagnosis not present

## 2024-01-17 DIAGNOSIS — R6889 Other general symptoms and signs: Secondary | ICD-10-CM | POA: Diagnosis not present

## 2024-01-18 ENCOUNTER — Ambulatory Visit: Payer: Self-pay | Admitting: "Endocrinology

## 2024-01-18 ENCOUNTER — Ambulatory Visit (INDEPENDENT_AMBULATORY_CARE_PROVIDER_SITE_OTHER)

## 2024-01-18 VITALS — BP 126/80 | HR 79 | Temp 97.9°F | Ht 64.0 in | Wt 184.2 lb

## 2024-01-18 DIAGNOSIS — Z0000 Encounter for general adult medical examination without abnormal findings: Secondary | ICD-10-CM | POA: Diagnosis not present

## 2024-01-18 NOTE — Patient Instructions (Signed)
 [name],  Thank you for taking the time for your Medicare Wellness Visit. I appreciate your continued commitment to your health goals. Please review the care plan we discussed, and feel free to reach out if I can assist you further.  Medicare recommends these wellness visits once per year to help you and your care team stay ahead of potential health issues. These visits are designed to focus on prevention, allowing your provider to concentrate on managing your acute and chronic conditions during your regular appointments.  Please note that Annual Wellness Visits do not include a physical exam. Some assessments may be limited, especially if the visit was conducted virtually. If needed, we may recommend a separate in-person follow-up with your provider.  Ongoing Care Seeing your primary care provider every 3 to 6 months helps us  monitor your health and provide consistent, personalized care.   Referrals If a referral was made during today's visit and you haven't received any updates within two weeks, please contact the referred provider directly to check on the status.  Recommended Screenings:  Health Maintenance  Topic Date Due   Flu Shot  07/25/2024*   COVID-19 Vaccine (4 - 2025-26 season) 01/02/2025*   Breast Cancer Screening  01/14/2025   Medicare Annual Wellness Visit  01/17/2025   DTaP/Tdap/Td vaccine (5 - Td or Tdap) 11/26/2027   Colon Cancer Screening  02/15/2033   Pneumococcal Vaccine for age over 80  Completed   DEXA scan (bone density measurement)  Completed   Hepatitis C Screening  Completed   Zoster (Shingles) Vaccine  Completed   HPV Vaccine  Aged Out   Meningitis B Vaccine  Aged Out  *Topic was postponed. The date shown is not the original due date.       01/18/2024    2:36 PM  Advanced Directives  Does Patient Have a Medical Advance Directive? Yes  Type of Advance Directive Living will   Advance Care Planning is important because it: Ensures you receive medical  care that aligns with your values, goals, and preferences. Provides guidance to your family and loved ones, reducing the emotional burden of decision-making during critical moments.  Vision: Annual vision screenings are recommended for early detection of glaucoma, cataracts, and diabetic retinopathy. These exams can also reveal signs of chronic conditions such as diabetes and high blood pressure.  Dental: Annual dental screenings help detect early signs of oral cancer, gum disease, and other conditions linked to overall health, including heart disease and diabetes.  Please see the attached documents for additional preventive care recommendations.

## 2024-01-18 NOTE — Progress Notes (Signed)
 Subjective:   [name] is a 68 y.o. who presents for a Medicare Wellness preventive visit.  As a reminder, Annual Wellness Visits don't include a physical exam, and some assessments may be limited, especially if this visit is performed virtually. We may recommend an in-person follow-up visit with your provider if needed.  Visit Complete: In person    Persons Participating in Visit: Patient.  AWV Questionnaire: No: Patient Medicare AWV questionnaire was not completed prior to this visit.  Cardiac Risk Factors include: advanced age (>38men, >47 women);dyslipidemia;hypertension;obesity (BMI >30kg/m2)     Objective:    Today's Vitals   01/18/24 1428 01/18/24 1429  BP: 126/80   Pulse: 79   Temp: 97.9 F (36.6 C)   TempSrc: Oral   SpO2: 97%   Weight: 184 lb 3.2 oz (83.6 kg)   Height: 5' 4 (1.626 m)   PainSc:  3    Body mass index is 31.62 kg/m.     01/18/2024    2:36 PM 11/18/2023    9:08 AM 01/05/2023    8:17 AM  Advanced Directives  Does Patient Have a Medical Advance Directive? Yes No Yes  Type of Advance Directive Living will  Healthcare Power of [place];Living will  Does patient want to make changes to medical advance directive?   Yes (ED - Information included in AVS)  Copy of Healthcare Power of Attorney in Chart?   No - copy requested  Would patient like information on creating a medical advance directive?  No - Patient declined     Current Medications (verified) Outpatient Encounter Medications as of 01/18/2024  Medication Sig   albuterol  (VENTOLIN  HFA) 108 (90 Base) MCG/ACT inhaler Inhale 2 puffs into the lungs every 4 (four) hours as needed for wheezing or shortness of breath.   ALPRAZolam  (XANAX ) 0.5 MG tablet TAKE 1/2 TO 1 TABLET(0.25 TO 0.5 MG) BY MOUTH AT BEDTIME AS NEEDED FOR SLEEP   aspirin  325 MG tablet Take 325 mg by mouth every other day.   benazepril  (LOTENSIN ) 40 MG tablet Take 1 tablet (40 mg total) by mouth daily.   CALCIUM  PO Take by  mouth daily.   citalopram  (CELEXA ) 20 MG tablet Take 1 tablet (20 mg total) by mouth every other day.   Coenzyme Q10 (COQ-10 PO) Take by mouth.   estradiol  (ESTRACE ) 0.5 MG tablet Take 1 tablet (0.5 mg total) by mouth daily.   KRILL OIL PO Take by mouth. Omega red krill oil   levocetirizine (XYZAL ) 5 MG tablet Take 1 tablet (5 mg total) by mouth every evening.   meloxicam  (MOBIC ) 15 MG tablet Take 1 tablet (15 mg total) by mouth daily.   Red Yeast Rice Extract (RED YEAST RICE PO) Take by mouth daily.   vitamin C (ASCORBIC ACID) 500 MG tablet Take 1,000 mg by mouth 3 (three) times daily.   Vitamin D , Ergocalciferol , (DRISDOL ) 1.25 MG (50000 UNIT) CAPS capsule Take 1 capsule (50,000 Units total) by mouth every 7 (seven) days.   acyclovir  (ZOVIRAX ) 400 MG tablet TAKE 1 TABLET(400 MG) BY MOUTH THREE TIMES DAILY (Patient not taking: Reported on 01/18/2024)   No facility-administered encounter medications on file as of 01/18/2024.    Allergies (verified) Betadine [povidone iodine], Codeine, and Other   History: Past Medical History:  Diagnosis Date   Arthritis    Broken ankle 04/2014   will have surgery on 01/30/15-right ankle   COVID-19 11/24/2021   Elevated hemoglobin A1c 01/17/2015   6.0   Endometriosis  Herpes simplex type 1 infection    Hypertension    Migraine    h/o migraines, none since hysterectomy   PONV (postoperative nausea and vomiting)    Situational depression    Varicose vein    surgery on right leg   Past Surgical History:  Procedure Laterality Date   ABDOMINAL HYSTERECTOMY  1994   LAVH/RSO   ANKLE SURGERY Right 01/30/2015   BREAST BIOPSY Left    CESAREAN SECTION     CHOLECYSTECTOMY     COLON RESECTION  11/06   and lap LSO   CYSTECTOMY     HERNIA REPAIR  1982   HERNIA REPAIR  2003   and bladder repair   SHOULDER ARTHROSCOPY WITH ROTATOR CUFF REPAIR Left 11/18/2023   Procedure: ARTHROSCOPY, SHOULDER, WITH ROTATOR CUFF REPAIR;  Surgeon: Cristy Bonner DASEN, MD;   Location: [facility];  Service: Orthopedics;  Laterality: Left;   SHOULDER SURGERY Right 04/2017   Tendon repair    Family History  Problem Relation Age of Onset   Skin cancer Other    Lung cancer Father    Spina bifida Daughter        and hydrocephalia   Diabetes Mother    Hypertension Mother    Thyroid  nodules Mother        being watched   Diabetes Sister        x 3   Hypertension Sister    Thyroid  nodules Sister    Social History   Socioeconomic History   Marital status: Married    Spouse name: Not on file   Number of children: 2   Years of education: Not on file   Highest education level: High school graduate  Occupational History   Not on file  Tobacco Use   Smoking status: Never    Passive exposure: Never   Smokeless tobacco: Never  Vaping Use   Vaping status: Never Used  Substance and Sexual Activity   Alcohol use: Yes    Alcohol/week: 2.0 - 3.0 standard drinks of alcohol    Types: 2 - 3 Glasses of wine per week    Comment: Occassionally   Drug use: No   Sexual activity: Yes    Partners: Male    Birth control/protection: Surgical    Comment: LAVH/RSO, then LSO  Other Topics Concern   Not on file  Social History Narrative   She has two daughters. One daughter lives across the street with her family. Her oldest daughter lives at home with her, she has spina bifida.    Social Drivers of Corporate investment banker Strain: Low Risk  (01/18/2024)   Overall Financial Resource Strain (CARDIA)    Difficulty of Paying Living Expenses: Not hard at all  Food Insecurity: No Food Insecurity (01/18/2024)   Hunger Vital Sign    Worried About Running Out of Food in the Last Year: Never true    Ran Out of Food in the Last Year: Never true  Transportation Needs: No Transportation Needs (01/18/2024)   PRAPARE - Administrator, Civil Service (Medical): No    Lack of Transportation (Non-Medical): No  Physical Activity: Sufficiently Active  (01/18/2024)   Exercise Vital Sign    Days of Exercise per Week: 5 days    Minutes of Exercise per Session: 30 min  Recent Concern: Physical Activity - Insufficiently Active (01/03/2024)   Exercise Vital Sign    Days of Exercise per Week: 3 days    Minutes  of Exercise per Session: 30 min  Stress: No Stress Concern Present (01/18/2024)   [employer] of Occupational Health - Occupational Stress Questionnaire    Feeling of Stress: Only a little  Social Connections: Socially Integrated (01/18/2024)   Social Connection and Isolation Panel    Frequency of Communication with Friends and Family: More than three times a week    Frequency of Social Gatherings with Friends and Family: More than three times a week    Attends Religious Services: More than 4 times per year    Active Member of [employer] or Organizations: Yes    Attends Engineer, structural: More than 4 times per year    Marital Status: Married    Tobacco Counseling Counseling given: Not Answered    Clinical Intake:  Pre-visit preparation completed: Yes  Pain : 0-10 Pain Score: 3  Pain Type: Other (Comment) (surgical pain) Pain Location: Shoulder Pain Orientation: Left Pain Descriptors / Indicators: Aching Pain Onset: More than a month ago Pain Frequency: Constant     Nutritional Status: BMI > 30  Obese Nutritional Risks: None Diabetes: No  Lab Results  Component Value Date   HGBA1C 6.0 (H) 01/03/2024   HGBA1C 6.1 (H) 07/14/2023   HGBA1C 6.2 (H) 01/05/2023     How often do you need to have someone help you when you read instructions, pamphlets, or other written materials from your doctor or pharmacy?: 1 - Never  Interpreter Needed?: No  Information entered by :: NAllen LPN   Activities of Daily Living     01/18/2024    2:30 PM 11/18/2023    8:58 AM  In your present state of health, do you have any difficulty performing the following activities:  Hearing? 0 0  Vision? 0 0  Difficulty  concentrating or making decisions? 0 0  Walking or climbing stairs? 0   Dressing or bathing? 0   Doing errands, shopping? 0   Preparing Food and eating ? N   Using the Toilet? N   In the past six months, have you accidently leaked urine? Y   Comment minor leaks   Do you have problems with loss of bowel control? N   Managing your Medications? N   Managing your Finances? N   Housekeeping or managing your Housekeeping? N     Patient Care Team: Early, Sara E, NP as PCP - General (Nurse Practitioner)  I have updated your Care Teams any recent Medical Services you may have received from other providers in the past year.     Assessment:   This is a routine wellness examination for Ivy.  Hearing/Vision screen Hearing Screening - Comments:: Denies hearing issues Vision Screening - Comments:: Regular eye exams, [facility]   Goals Addressed             This Visit's Progress    Patient Stated       01/18/2024, wants to lose weight       Depression Screen     01/18/2024    2:37 PM 01/03/2024    1:36 PM 07/14/2023    8:17 AM 04/22/2023    9:30 AM 01/05/2023    8:16 AM 04/03/2022    9:21 AM 11/24/2021    2:47 PM  PHQ 2/9 Scores  PHQ - 2 Score 0 0 0 0 0 0 0  PHQ- 9 Score 2      0  Exception Documentation       Medical reason    Fall Risk  01/18/2024    2:36 PM 01/03/2024    1:36 PM 07/14/2023    8:16 AM 04/22/2023    9:29 AM 01/05/2023    8:15 AM  Fall Risk   Falls in the past year? 0 0 0 0 0  Number falls in past yr: 0 0 0 0 0  Injury with Fall? 0 0 0 0 0  Risk for fall due to : Medication side effect No Fall Risks No Fall Risks  No Fall Risks  Follow up Falls evaluation completed;Falls prevention discussed Falls evaluation completed Falls evaluation completed  Falls evaluation completed    MEDICARE RISK AT HOME:  Medicare Risk at Home Any stairs in or around the home?: Yes If so, are there any without handrails?: No Home free of loose throw rugs in  walkways, pet beds, electrical cords, etc?: Yes Adequate lighting in your home to reduce risk of falls?: Yes Life alert?: No Use of a cane, walker or w/c?: No Grab bars in the bathroom?: Yes Shower chair or bench in shower?: Yes Elevated toilet seat or a handicapped toilet?: Yes  TIMED UP AND GO:  Was the test performed?  Yes  Length of time to ambulate 10 feet: 5 sec Gait steady and fast without use of assistive device  Cognitive Function: 6CIT completed    01/05/2023    8:46 AM  MMSE - Mini Mental State Exam  Orientation to time 5  Orientation to Place 5  Registration 3  Attention/ Calculation 5  Recall 3  Language- name 2 objects 2  Language- repeat 1  Language- follow 3 step command 3  Language- read & follow direction 1  Write a sentence 1  Copy design 1  Total score 30        01/18/2024    2:38 PM  6CIT Screen  What Year? 0 points  What month? 0 points  What time? 0 points  Count back from 20 0 points  Months in reverse 0 points  Repeat phrase 0 points  Total Score 0 points    Immunizations Immunization History  Administered Date(s) Administered   Fluad Trivalent(High Dose 65+) 01/05/2023   Influenza Whole 01/13/2013   Influenza, Seasonal, Injecte, Preservative Fre 01/05/2018   Influenza,inj,Quad PF,6+ Mos 01/18/2017, 02/15/2020, 12/31/2021   Influenza-Unspecified 01/29/2011, 01/25/2014, 01/18/2015, 12/26/2015, 01/11/2019, 01/15/2021   Moderna SARS-COV2 Booster Vaccination 02/10/2021   Moderna Sars-Covid-2 Vaccination 04/29/2020   PFIZER(Purple Top)SARS-COV-2 Vaccination 07/12/2019, 08/02/2019   PNEUMOCOCCAL CONJUGATE-20 06/30/2022   Td 01/29/2011, 01/29/2011   Tdap 01/29/2011, 11/25/2017   Zoster Recombinant(Shingrix) 07/23/2017, 09/13/2017    Screening Tests Health Maintenance  Topic Date Due   Influenza Vaccine  07/25/2024 (Originally 11/26/2023)   COVID-19 Vaccine (4 - 2025-26 season) 01/02/2025 (Originally 12/27/2023)   Mammogram  01/14/2025    Medicare Annual Wellness (AWV)  01/17/2025   DTaP/Tdap/Td (5 - Td or Tdap) 11/26/2027   Colonoscopy  02/15/2033   Pneumococcal Vaccine: 50+ Years  Completed   DEXA SCAN  Completed   Hepatitis C Screening  Completed   Zoster Vaccines- Shingrix  Completed   HPV VACCINES  Aged Out   Meningococcal B Vaccine  Aged Out    Health Maintenance Items Addressed: Getting flu vaccine end of the month. Due for covid vaccine.  Additional Screening:  Vision Screening: Recommended annual ophthalmology exams for early detection of glaucoma and other disorders of the eye. Is the patient up to date with their annual eye exam?  Yes  Who is the provider or what  is the name of the office in which the patient attends annual eye exams? Dr. Glendia  Dental Screening: Recommended annual dental exams for proper oral hygiene  Community Resource Referral / Chronic Care Management: CRR required this visit?  No   CCM required this visit?  No   Plan:    I have personally reviewed and noted the following in the patient's chart:   Medical and social history Use of alcohol, tobacco or illicit drugs  Current medications and supplements including opioid prescriptions. Patient is not currently taking opioid prescriptions. Functional ability and status Nutritional status Physical activity Advanced directives List of other physicians Hospitalizations, surgeries, and ER visits in previous 12 months Vitals Screenings to include cognitive, depression, and falls Referrals and appointments  In addition, I have reviewed and discussed with patient certain preventive protocols, quality metrics, and best practice recommendations. A written personalized care plan for preventive services as well as general preventive health recommendations were provided to patient.   Ardella FORBES Dawn, LPN   [id number]   After Visit Summary: (In Person-Printed) AVS printed and given to the patient  Notes: Nothing significant to report  at this time.

## 2024-01-19 DIAGNOSIS — M25612 Stiffness of left shoulder, not elsewhere classified: Secondary | ICD-10-CM | POA: Diagnosis not present

## 2024-01-19 DIAGNOSIS — R531 Weakness: Secondary | ICD-10-CM | POA: Diagnosis not present

## 2024-01-19 DIAGNOSIS — R6889 Other general symptoms and signs: Secondary | ICD-10-CM | POA: Diagnosis not present

## 2024-01-19 DIAGNOSIS — Z9889 Other specified postprocedural states: Secondary | ICD-10-CM | POA: Diagnosis not present

## 2024-01-24 DIAGNOSIS — M25612 Stiffness of left shoulder, not elsewhere classified: Secondary | ICD-10-CM | POA: Diagnosis not present

## 2024-01-24 DIAGNOSIS — Z9889 Other specified postprocedural states: Secondary | ICD-10-CM | POA: Diagnosis not present

## 2024-01-24 DIAGNOSIS — R6889 Other general symptoms and signs: Secondary | ICD-10-CM | POA: Diagnosis not present

## 2024-01-24 DIAGNOSIS — R531 Weakness: Secondary | ICD-10-CM | POA: Diagnosis not present

## 2024-02-01 DIAGNOSIS — R531 Weakness: Secondary | ICD-10-CM | POA: Diagnosis not present

## 2024-02-01 DIAGNOSIS — Z9889 Other specified postprocedural states: Secondary | ICD-10-CM | POA: Diagnosis not present

## 2024-02-01 DIAGNOSIS — R6889 Other general symptoms and signs: Secondary | ICD-10-CM | POA: Diagnosis not present

## 2024-02-01 DIAGNOSIS — M25612 Stiffness of left shoulder, not elsewhere classified: Secondary | ICD-10-CM | POA: Diagnosis not present

## 2024-02-02 ENCOUNTER — Other Ambulatory Visit: Payer: Self-pay | Admitting: Nurse Practitioner

## 2024-02-02 DIAGNOSIS — F5102 Adjustment insomnia: Secondary | ICD-10-CM

## 2024-02-07 DIAGNOSIS — Z9889 Other specified postprocedural states: Secondary | ICD-10-CM | POA: Diagnosis not present

## 2024-02-07 DIAGNOSIS — R531 Weakness: Secondary | ICD-10-CM | POA: Diagnosis not present

## 2024-02-07 DIAGNOSIS — R6889 Other general symptoms and signs: Secondary | ICD-10-CM | POA: Diagnosis not present

## 2024-02-07 DIAGNOSIS — M25612 Stiffness of left shoulder, not elsewhere classified: Secondary | ICD-10-CM | POA: Diagnosis not present

## 2024-02-08 DIAGNOSIS — L820 Inflamed seborrheic keratosis: Secondary | ICD-10-CM | POA: Diagnosis not present

## 2024-02-08 DIAGNOSIS — D225 Melanocytic nevi of trunk: Secondary | ICD-10-CM | POA: Diagnosis not present

## 2024-02-10 DIAGNOSIS — R531 Weakness: Secondary | ICD-10-CM | POA: Diagnosis not present

## 2024-02-10 DIAGNOSIS — Z9889 Other specified postprocedural states: Secondary | ICD-10-CM | POA: Diagnosis not present

## 2024-02-10 DIAGNOSIS — R6889 Other general symptoms and signs: Secondary | ICD-10-CM | POA: Diagnosis not present

## 2024-02-10 DIAGNOSIS — M25612 Stiffness of left shoulder, not elsewhere classified: Secondary | ICD-10-CM | POA: Diagnosis not present

## 2024-02-11 DIAGNOSIS — Z9889 Other specified postprocedural states: Secondary | ICD-10-CM | POA: Diagnosis not present

## 2024-02-14 DIAGNOSIS — R531 Weakness: Secondary | ICD-10-CM | POA: Diagnosis not present

## 2024-02-14 DIAGNOSIS — Z9889 Other specified postprocedural states: Secondary | ICD-10-CM | POA: Diagnosis not present

## 2024-02-14 DIAGNOSIS — M25612 Stiffness of left shoulder, not elsewhere classified: Secondary | ICD-10-CM | POA: Diagnosis not present

## 2024-02-14 DIAGNOSIS — R6889 Other general symptoms and signs: Secondary | ICD-10-CM | POA: Diagnosis not present

## 2024-02-16 DIAGNOSIS — R6889 Other general symptoms and signs: Secondary | ICD-10-CM | POA: Diagnosis not present

## 2024-02-16 DIAGNOSIS — M25612 Stiffness of left shoulder, not elsewhere classified: Secondary | ICD-10-CM | POA: Diagnosis not present

## 2024-02-16 DIAGNOSIS — R29898 Other symptoms and signs involving the musculoskeletal system: Secondary | ICD-10-CM | POA: Diagnosis not present

## 2024-02-16 DIAGNOSIS — Z9889 Other specified postprocedural states: Secondary | ICD-10-CM | POA: Diagnosis not present

## 2024-02-23 DIAGNOSIS — Z9889 Other specified postprocedural states: Secondary | ICD-10-CM | POA: Diagnosis not present

## 2024-02-23 DIAGNOSIS — R29898 Other symptoms and signs involving the musculoskeletal system: Secondary | ICD-10-CM | POA: Diagnosis not present

## 2024-02-23 DIAGNOSIS — M25612 Stiffness of left shoulder, not elsewhere classified: Secondary | ICD-10-CM | POA: Diagnosis not present

## 2024-02-29 DIAGNOSIS — Z9889 Other specified postprocedural states: Secondary | ICD-10-CM | POA: Diagnosis not present

## 2024-02-29 DIAGNOSIS — R531 Weakness: Secondary | ICD-10-CM | POA: Diagnosis not present

## 2024-02-29 DIAGNOSIS — R6889 Other general symptoms and signs: Secondary | ICD-10-CM | POA: Diagnosis not present

## 2024-02-29 DIAGNOSIS — M25612 Stiffness of left shoulder, not elsewhere classified: Secondary | ICD-10-CM | POA: Diagnosis not present

## 2024-03-07 ENCOUNTER — Telehealth: Payer: Self-pay | Admitting: Nurse Practitioner

## 2024-03-07 ENCOUNTER — Ambulatory Visit
Admission: RE | Admit: 2024-03-07 | Discharge: 2024-03-07 | Disposition: A | Source: Ambulatory Visit | Attending: Certified Nurse Midwife | Admitting: Certified Nurse Midwife

## 2024-03-07 ENCOUNTER — Other Ambulatory Visit: Payer: Self-pay

## 2024-03-07 DIAGNOSIS — Z1231 Encounter for screening mammogram for malignant neoplasm of breast: Secondary | ICD-10-CM | POA: Diagnosis not present

## 2024-03-07 DIAGNOSIS — R7989 Other specified abnormal findings of blood chemistry: Secondary | ICD-10-CM

## 2024-03-07 DIAGNOSIS — R7303 Prediabetes: Secondary | ICD-10-CM

## 2024-03-07 NOTE — Telephone Encounter (Signed)
 Copied from CRM [id number]. Topic: Referral - Request for Referral >> Mar 07, 2024 10:29 AM Amy B wrote: Did the patient discuss referral with their provider in the last year? Yes (If No - schedule appointment) (If Yes - send message)  Appointment offered? No  Type of order/referral and detailed reason for visit: Endocrinology  Preference of office, provider, location: Dr. Lenis in [place]  If referral order, have you been seen by this specialty before? Yes (If Yes, this issue or another issue? When? Where?  Can we respond through MyChart? Yes

## 2024-03-10 ENCOUNTER — Other Ambulatory Visit: Payer: Self-pay | Admitting: Certified Nurse Midwife

## 2024-03-10 DIAGNOSIS — R928 Other abnormal and inconclusive findings on diagnostic imaging of breast: Secondary | ICD-10-CM

## 2024-03-12 ENCOUNTER — Ambulatory Visit (HOSPITAL_BASED_OUTPATIENT_CLINIC_OR_DEPARTMENT_OTHER): Payer: Self-pay | Admitting: Obstetrics & Gynecology

## 2024-03-14 DIAGNOSIS — M25612 Stiffness of left shoulder, not elsewhere classified: Secondary | ICD-10-CM | POA: Diagnosis not present

## 2024-03-14 DIAGNOSIS — Z9889 Other specified postprocedural states: Secondary | ICD-10-CM | POA: Diagnosis not present

## 2024-03-14 DIAGNOSIS — R29898 Other symptoms and signs involving the musculoskeletal system: Secondary | ICD-10-CM | POA: Diagnosis not present

## 2024-03-21 DIAGNOSIS — Z9889 Other specified postprocedural states: Secondary | ICD-10-CM | POA: Diagnosis not present

## 2024-03-22 ENCOUNTER — Ambulatory Visit

## 2024-03-22 ENCOUNTER — Ambulatory Visit
Admission: RE | Admit: 2024-03-22 | Discharge: 2024-03-22 | Disposition: A | Source: Ambulatory Visit | Attending: Certified Nurse Midwife | Admitting: Certified Nurse Midwife

## 2024-03-22 DIAGNOSIS — R928 Other abnormal and inconclusive findings on diagnostic imaging of breast: Secondary | ICD-10-CM | POA: Diagnosis not present

## 2024-03-29 ENCOUNTER — Ambulatory Visit: Admitting: "Endocrinology

## 2024-05-01 ENCOUNTER — Encounter (HOSPITAL_BASED_OUTPATIENT_CLINIC_OR_DEPARTMENT_OTHER): Payer: Self-pay | Admitting: Obstetrics & Gynecology

## 2024-05-01 ENCOUNTER — Ambulatory Visit (INDEPENDENT_AMBULATORY_CARE_PROVIDER_SITE_OTHER): Admitting: Obstetrics & Gynecology

## 2024-05-01 VITALS — BP 155/90 | HR 74 | Ht 65.25 in | Wt 183.0 lb

## 2024-05-01 DIAGNOSIS — M85851 Other specified disorders of bone density and structure, right thigh: Secondary | ICD-10-CM

## 2024-05-01 DIAGNOSIS — Z01419 Encounter for gynecological examination (general) (routine) without abnormal findings: Secondary | ICD-10-CM | POA: Diagnosis not present

## 2024-05-01 DIAGNOSIS — Z79890 Hormone replacement therapy (postmenopausal): Secondary | ICD-10-CM

## 2024-05-01 DIAGNOSIS — Z1331 Encounter for screening for depression: Secondary | ICD-10-CM

## 2024-05-01 DIAGNOSIS — N951 Menopausal and female climacteric states: Secondary | ICD-10-CM | POA: Diagnosis not present

## 2024-05-01 DIAGNOSIS — B009 Herpesviral infection, unspecified: Secondary | ICD-10-CM | POA: Diagnosis not present

## 2024-05-01 DIAGNOSIS — Z9189 Other specified personal risk factors, not elsewhere classified: Secondary | ICD-10-CM

## 2024-05-01 MED ORDER — ESTRADIOL 0.5 MG PO TABS: 0.5000 mg | ORAL_TABLET | Freq: Every day | ORAL | 3 refills | Status: AC

## 2024-05-01 NOTE — Progress Notes (Signed)
 "  Breast and Pelvic Exam Patient name: [name] MRN [id number]  Date of birth: [birth year] Chief Complaint:   Breast and Pelvic Exam  History of Present Illness:   [name] is a 69 y.o. G2P2 Caucasian female being seen today for breast and pelvic exam.  Denies vaginal bleeding.    H/o thyroid  nodules.  Had to have FNA of two nodules.  One was benign and one was non-diagnostic.  Seeing Dr. Lenis in [place] at the end of January.    Having some issues with left should pain and decreased ROM.  Imaging showed biceps tear, rotator cuff tear and spur removal.    Denies vaginal bleeding.    Patient's last menstrual period was 04/27/1992.   Last pap 2012. Results were: NILM w/ HRHPV negative. H/O abnormal pap: no Last mammogram: 03/22/24. Results were: normal. Family h/o breast cancer: no Last colonoscopy: 02/16/2023. Results were: abnormal diverticulosis. Family h/o colorectal cancer: no Dexa:  2021.  T score -1.2.       05/01/2024    3:40 PM 01/18/2024    2:37 PM 01/03/2024    1:36 PM 07/14/2023    8:17 AM 04/22/2023    9:30 AM  Depression screen PHQ 2/9  Decreased Interest 0 0 0 0 0  Down, Depressed, Hopeless 0 0 0 0 0  PHQ - 2 Score 0 0 0 0 0  Altered sleeping 2 1     Tired, decreased energy 2 1     Change in appetite 2 0     Feeling bad or failure about yourself  0 0     Trouble concentrating 1 0     Moving slowly or fidgety/restless 0 0     Suicidal thoughts 0 0     PHQ-9 Score 7 2      Difficult doing work/chores Somewhat difficult Not difficult at all        Data saved with a previous flowsheet row definition        05/01/2024    3:41 PM 06/30/2021   12:46 PM 05/19/2021    2:49 PM 03/31/2021   10:27 AM  GAD 7 : Generalized Anxiety Score  Nervous, Anxious, on Edge 1 1 1 1   Control/stop worrying 0 1 1 1   Worry too much - different things  1 1 1   Trouble relaxing 1 1 1 2   Restless 0 1 0 1  Easily annoyed or irritable 0 0 0 0  Afraid - awful might happen 0 0 0 1   Total GAD 7 Score  5 4 7   Anxiety Difficulty Not difficult at all Not difficult at all Not difficult at all Somewhat difficult     Review of Systems:   Pertinent items are noted in HPI Denies any urinary or bowel changes.  Denies pelvic pain.   Pertinent History Reviewed:  Reviewed past medical,surgical, social and family history.  Reviewed problem list, medications and allergies. Physical Assessment:   Vitals:   05/01/24 1535  BP: (!) 155/90  Pulse: 74  Weight: 183 lb (83 kg)  Height: 5' 5.25 (1.657 m)  Body mass index is 30.22 kg/m.        Physical Examination:   General appearance - well appearing, and in no distress  Mental status - alert, oriented to person, place, and time  Psych:  She has a normal mood and affect  Skin - warm and dry, normal color, no suspicious lesions noted  Chest - effort normal, all  lung fields clear to auscultation bilaterally  Heart - normal rate and regular rhythm  Neck:  midline trachea, no thyromegaly or nodules  Breasts - breasts appear normal, no suspicious masses, no skin or nipple changes or  axillary nodes  Abdomen - soft, nontender, nondistended, no masses or organomegaly  Pelvic - VULVA: normal appearing vulva with no masses, tenderness or lesions   VAGINA: normal appearing vagina with normal color and discharge, no lesions   CERVIX: surgically absent  Thin prep pap is not indicated today  UTERUS: surgically absent  ADNEXA: No adnexal masses or tenderness noted.  Rectal - normal rectal, good sphincter tone, no masses felt  Extremities:  No swelling or varicosities noted  Chaperone present for exam  No results found for this or any previous visit (from the past 24 hours).  Assessment & Plan:  1. GYN exam for high-risk Medicare patient (Primary) - Pap smear not indicated - Mammogram 03/22/2024 - Colonoscopy 02/16/2023 - Bone mineral density 2021, T score -1.2 - lab work done with PCP, Camie Early - vaccines  reviewed/updated  2. Menopausal syndrome on hormone replacement therapy - estradiol  (ESTRACE ) 0.5 MG tablet; Take 1 tablet (0.5 mg total) by mouth daily.  Dispense: 90 tablet; Refill: 3  3. Osteopenia of neck of right femur - DG Bone Density; Future  4. HSV-1 infection - does not need acyclovir  refill at this time   Orders Placed This Encounter  Procedures   DG Bone Density    Meds:  Meds ordered this encounter  Medications   estradiol  (ESTRACE ) 0.5 MG tablet    Sig: Take 1 tablet (0.5 mg total) by mouth daily.    Dispense:  90 tablet    Refill:  3    Follow-up: Return in about 1 year (around 05/01/2025).  Ronal GORMAN [place], MD 05/01/2024 4:29 PM "

## 2024-05-23 ENCOUNTER — Encounter: Payer: Self-pay | Admitting: "Endocrinology

## 2024-05-23 ENCOUNTER — Ambulatory Visit: Admitting: "Endocrinology

## 2024-05-23 VITALS — BP 144/86 | HR 76 | Ht 65.25 in | Wt 185.4 lb

## 2024-05-23 DIAGNOSIS — E782 Mixed hyperlipidemia: Secondary | ICD-10-CM

## 2024-05-23 DIAGNOSIS — R7303 Prediabetes: Secondary | ICD-10-CM

## 2024-05-23 DIAGNOSIS — E042 Nontoxic multinodular goiter: Secondary | ICD-10-CM | POA: Diagnosis not present

## 2024-05-23 DIAGNOSIS — E0590 Thyrotoxicosis, unspecified without thyrotoxic crisis or storm: Secondary | ICD-10-CM | POA: Diagnosis not present

## 2024-05-23 LAB — POCT GLYCOSYLATED HEMOGLOBIN (HGB A1C): HbA1c, POC (prediabetic range): 6.3 % (ref 5.7–6.4)

## 2024-05-23 NOTE — Progress Notes (Signed)
 "          Endocrinology Consult Note                                            05/23/2024, 4:00 PM   Subjective:    Patient ID: [name], female    DOB: Oct 22, 1955, PCP Early, Camie BRAVO, NP   Past Medical History:  Diagnosis Date   Arthritis    Broken ankle 04/2014   will have surgery on 01/30/15-right ankle   COVID-19 11/24/2021   Elevated hemoglobin A1c 01/17/2015   6.0   Endometriosis    Herpes simplex type 1 infection    Hypertension    Migraine    h/o migraines, none since hysterectomy   PONV (postoperative nausea and vomiting)    Situational depression    Varicose vein    surgery on right leg   Past Surgical History:  Procedure Laterality Date   ANKLE SURGERY Right 01/30/2015   BREAST BIOPSY Left    CESAREAN SECTION     CHOLECYSTECTOMY     COLON RESECTION  02/2005   and lap LSO   CYSTECTOMY     HERNIA REPAIR  1982   HERNIA REPAIR  2003   and bladder repair   LAPAROSCOPIC ASSISTED VAGINAL HYSTERECTOMY  1994   LAVH/RSO   SHOULDER ARTHROSCOPY WITH ROTATOR CUFF REPAIR Left 11/18/2023   Procedure: ARTHROSCOPY, SHOULDER, WITH ROTATOR CUFF REPAIR;  Surgeon: Cristy Bonner DASEN, MD;  Location: [facility];  Service: Orthopedics;  Laterality: Left;   SHOULDER SURGERY Right 04/2017   Tendon repair    Social History   Socioeconomic History   Marital status: Married    Spouse name: Not on file   Number of children: 2   Years of education: Not on file   Highest education level: High school graduate  Occupational History   Not on file  Tobacco Use   Smoking status: Never    Passive exposure: Never   Smokeless tobacco: Never  Vaping Use   Vaping status: Never Used  Substance and Sexual Activity   Alcohol use: Yes    Alcohol/week: 2.0 - 3.0 standard drinks of alcohol    Types: 2 - 3 Glasses of wine per week    Comment: Occassionally   Drug use: No   Sexual activity: Yes    Partners: Male    Birth control/protection: Surgical    Comment: LAVH/RSO,  then LSO  Other Topics Concern   Not on file  Social History Narrative   She has two daughters. One daughter lives across the street with her family. Her oldest daughter lives at home with her, she has spina bifida.    Social Drivers of Health   Tobacco Use: Low Risk (05/23/2024)   Patient History    Smoking Tobacco Use: Never    Smokeless Tobacco Use: Never    Passive Exposure: Never  Financial Resource Strain: Low Risk (01/18/2024)   Overall Financial Resource Strain (CARDIA)    Difficulty of Paying Living Expenses: Not hard at all  Food Insecurity: No Food Insecurity (01/18/2024)   Epic    Worried About Radiation Protection Practitioner of Food in the Last Year: Never true    Ran Out of Food in the Last Year: Never true  Transportation Needs: No Transportation Needs (01/18/2024)   Epic    Lack of Transportation (Medical): No  Lack of Transportation (Non-Medical): No  Physical Activity: Sufficiently Active (01/18/2024)   Exercise Vital Sign    Days of Exercise per Week: 5 days    Minutes of Exercise per Session: 30 min  Recent Concern: Physical Activity - Insufficiently Active (01/03/2024)   Exercise Vital Sign    Days of Exercise per Week: 3 days    Minutes of Exercise per Session: 30 min  Stress: No Stress Concern Present (01/18/2024)   [employer] of Occupational Health - Occupational Stress Questionnaire    Feeling of Stress: Only a little  Social Connections: Socially Integrated (01/18/2024)   Social Connection and Isolation Panel    Frequency of Communication with Friends and Family: More than three times a week    Frequency of Social Gatherings with Friends and Family: More than three times a week    Attends Religious Services: More than 4 times per year    Active Member of Clubs or Organizations: Yes    Attends Banker Meetings: More than 4 times per year    Marital Status: Married  Depression (PHQ2-9): Medium Risk (05/01/2024)   Depression (PHQ2-9)    PHQ-2 Score: 7   Alcohol Screen: Low Risk (01/18/2024)   Alcohol Screen    Last Alcohol Screening Score (AUDIT): 0  Housing: Unknown (01/18/2024)   Epic    Unable to Pay for Housing in the Last Year: No    Number of Times Moved in the Last Year: Not on file    Homeless in the Last Year: No  Utilities: Not At Risk (01/18/2024)   Epic    Threatened with loss of utilities: No  Health Literacy: Adequate Health Literacy (01/18/2024)   B1300 Health Literacy    Frequency of need for help with medical instructions: Never   Family History  Problem Relation Age of Onset   Thyroid  disease Mother    Diabetes Mother    Hypertension Mother    Thyroid  nodules Mother        being watched   Hyperlipidemia Mother    Stroke Mother    Osteoporosis Mother    Lung cancer Father    Diabetes Sister        x 3   Hypertension Sister    Thyroid  nodules Sister    Spina bifida Daughter        and hydrocephalia   Skin cancer Other    Outpatient Encounter Medications as of 05/23/2024  Medication Sig   acyclovir  (ZOVIRAX ) 400 MG tablet TAKE 1 TABLET(400 MG) BY MOUTH THREE TIMES DAILY (Patient taking differently: as needed.)   b complex vitamins capsule Take 1 capsule by mouth daily.   CALCIUM  PO Take by mouth daily. (Patient taking differently: Take by mouth 3 (three) times a week.)   Cholecalciferol (VITAMIN D -3 PO) Take by mouth daily.   meloxicam  (MOBIC ) 15 MG tablet Take 1 tablet (15 mg total) by mouth daily.   Misc Natural Products (NEURIVA PO) Take by mouth daily.   Multiple Vitamins-Minerals (ZINC PO) Take 50 mg by mouth daily.   TURMERIC CURCUMIN PO Take 1,400 mg by mouth daily.   albuterol  (VENTOLIN  HFA) 108 (90 Base) MCG/ACT inhaler Inhale 2 puffs into the lungs every 4 (four) hours as needed for wheezing or shortness of breath.   ALPRAZolam  (XANAX ) 0.5 MG tablet TAKE 1/2 TO 1 TABLET(0.25 TO 0.5 MG) BY MOUTH AT BEDTIME AS NEEDED FOR SLEEP   aspirin  81 MG chewable tablet Chew 162 mg by mouth daily.  benazepril   (LOTENSIN ) 40 MG tablet Take 1 tablet (40 mg total) by mouth daily.   citalopram  (CELEXA ) 20 MG tablet Take 1 tablet (20 mg total) by mouth every other day.   Coenzyme Q10 (COQ-10 PO) Take by mouth.   estradiol  (ESTRACE ) 0.5 MG tablet Take 1 tablet (0.5 mg total) by mouth daily.   KRILL OIL PO Take by mouth. Omega red krill oil   levocetirizine (XYZAL ) 5 MG tablet Take 1 tablet (5 mg total) by mouth every evening.   Red Yeast Rice Extract (RED YEAST RICE PO) Take by mouth daily.   vitamin C (ASCORBIC ACID) 500 MG tablet Take 1,000 mg by mouth 3 (three) times daily.   Vitamin D , Ergocalciferol , (DRISDOL ) 1.25 MG (50000 UNIT) CAPS capsule Take 1 capsule (50,000 Units total) by mouth every 7 (seven) days. (Patient not taking: Reported on 05/23/2024)   No facility-administered encounter medications on file as of 05/23/2024.   ALLERGIES: Allergies[1]  VACCINATION STATUS: Immunization History  Administered Date(s) Administered   Fluad Trivalent(High Dose 65+) 01/05/2023   Influenza Whole 01/13/2013   Influenza, Seasonal, Injecte, Preservative Fre 01/05/2018   Influenza,inj,Quad PF,6+ Mos 01/18/2017, 02/15/2020, 12/31/2021   Influenza-Unspecified 01/29/2011, 01/25/2014, 01/18/2015, 12/26/2015, 01/11/2019, 01/15/2021   Moderna SARS-COV2 Booster Vaccination 02/10/2021   Moderna Sars-Covid-2 Vaccination 04/29/2020   PFIZER(Purple Top)SARS-COV-2 Vaccination 07/12/2019, 08/02/2019   PNEUMOCOCCAL CONJUGATE-20 06/30/2022   Td 01/29/2011, 01/29/2011   Tdap 01/29/2011, 11/25/2017   Zoster Recombinant(Shingrix) 07/23/2017, 09/13/2017    HPI [name] is 69 y.o. female who presents today with a medical history as above. she is being seen in consultation for multinodular goiter requested by Early, Camie BRAVO, NP.  History is obtained from the patient as well as chart review.  She was in relatively stable health.  Summer 2025 when she started to have progressively worsening fatigue.  She underwent thyroid   function test which showed evidence of mild subclinical hyperthyroidism.  She did not require intervention.  Thyroid  ultrasound performed in July 2025 showed multinodular goiter with 3 nodules meeting criteria for fine-needle aspiration.  She underwent FNA of 2 nodules on the right lobe revealing 1 benign and 1 nondiagnostic materials.   She did not have any subsequent imaging or intervention.  She denies dysphagia, shortness of breath, no voice change.  She denies any major weight changes.  She continues to have fatigue.  She has background history of prediabetes.  She has family history of various thyroid  dysfunctions, however no family history of thyroid  malignancy.  She is not on any antithyroid or for thyroid  supplements, not on Biotin. She does not have recent thyroid  ultrasound nor thyroid  function tests. She has been dealing with sick family members both her mother as well as her daughter.  Riesenberger problems include hyperlipidemia, not on statin, taking red yeast rice tablets for it. Has hypertension on ACE inhibitor.  Current medications include benazepril  estradiol , citalopram , alprazolam , meloxicam .    Review of Systems  Constitutional: +mildly fluctuating body weight, + fatigue, no subjective hyperthermia, no subjective hypothermia Eyes: no blurry vision, no xerophthalmia ENT: no sore throat, no nodules palpated in throat, no dysphagia/odynophagia, no hoarseness Cardiovascular: no Chest Pain, no Shortness of Breath, no palpitations, no leg swelling Respiratory: no cough, no shortness of breath Gastrointestinal: no Nausea/Vomiting/Diarhhea Musculoskeletal: no muscle/joint aches Skin: no rashes Neurological: no tremors, no numbness, no tingling, no dizziness Psychiatric: no depression, no anxiety  Objective:       05/23/2024    2:17 PM 05/01/2024  3:35 PM 01/18/2024    2:28 PM  Vitals with BMI  Height 5' 5.25 5' 5.25 5' 4  Weight 185 lbs 6 oz 183 lbs 184 lbs 3 oz  BMI  30.63 30.23 31.6  Systolic 144 155 873  Diastolic 86 90 80  Pulse 76 74 79    BP (!) 144/86   Pulse 76   Ht 5' 5.25 (1.657 m)   Wt 185 lb 6.4 oz (84.1 kg)   LMP 04/27/1992   BMI 30.62 kg/m   Wt Readings from Last 3 Encounters:  05/23/24 185 lb 6.4 oz (84.1 kg)  05/01/24 183 lb (83 kg)  01/18/24 184 lb 3.2 oz (83.6 kg)    Physical Exam  Constitutional:  Body mass index is 30.62 kg/m.,  not in acute distress, normal state of mind Eyes: PERRLA, EOMI, no exophthalmos ENT: moist mucous membranes, + gross thyromegaly, no gross cervical lymphadenopathy Cardiovascular: normal precordial activity, Regular Rate and Rhythm, no Murmur/Rubs/Gallops Respiratory:  adequate breathing efforts, no gross chest deformity, Clear to auscultation bilaterally Gastrointestinal: abdomen soft, Non -tender, No distension, Bowel Sounds present, no gross organomegaly Musculoskeletal: no gross deformities, strength intact in all four extremities, no peripheral edema Skin: moist, warm, no rashes Neurological: no tremor with outstretched hands, Deep tendon reflexes normal in bilateral lower extremities.  CMP ( most recent) CMP     Component Value Date/Time   NA 139 01/03/2024 1429   K 4.4 01/03/2024 1429   CL 103 01/03/2024 1429   CO2 22 01/03/2024 1429   GLUCOSE 92 01/03/2024 1429   GLUCOSE 98 03/31/2021 1026   BUN 12 01/03/2024 1429   CREATININE 0.63 01/03/2024 1429   CREATININE 0.61 03/31/2021 1026   CALCIUM  9.5 01/03/2024 1429   PROT 6.7 01/03/2024 1429   ALBUMIN 4.6 01/03/2024 1429   AST 15 01/03/2024 1429   ALT 12 01/03/2024 1429   ALKPHOS 71 01/03/2024 1429   BILITOT 0.3 01/03/2024 1429   EGFR 97 01/03/2024 1429   GFRNONAA 94 09/23/2017 1421     Diabetic Labs (most recent): Lab Results  Component Value Date   HGBA1C 6.3 05/23/2024   HGBA1C 6.0 (H) 01/03/2024   HGBA1C 6.1 (H) 07/14/2023     Lipid Panel ( most recent) Lipid Panel     Component Value Date/Time   CHOL 281 (H)  01/03/2024 1429   TRIG 135 01/03/2024 1429   HDL 71 01/03/2024 1429   CHOLHDL 4.0 01/03/2024 1429   CHOLHDL 3.0 03/31/2021 1026   VLDL 15 01/23/2016 1012   LDLCALC 186 (H) 01/03/2024 1429   LDLCALC 134 (H) 03/31/2021 1026   LABVLDL 24 01/03/2024 1429      Lab Results  Component Value Date   TSH 0.285 (L) 07/26/2023   TSH 0.302 (L) 07/14/2023   TSH 0.59 10/19/2018   TSH 0.471 01/17/2015   TSH 0.792 12/21/2013   FREET4 1.0 10/27/2023     November 04, 2023 thyroid  ultrasound: Right lobe 5.7 cm with 3 nodules measuring right 1.7 cm-TR 5 right superior,  biopsied with benign findings,  2.2 cm TR 3 right mid not biopsied, 2.2 cm-TR 5 right inferior, biopsied with nondiagnostic material. 4.5 cm left lobe with 2 nodules-1.5 cm TR 4, 1.4 cm TR 4 left inferior   Assessment & Plan:   1. Multinodular goiter (Primary) 2.  Hyperlipidemia  3.  Subclinical hyperthyroidism  4.  Prediabetes  4.  Hypertension   - [name]  is being seen at a kind request of  Oris Camie BRAVO, NP. - I have reviewed her available  records and clinically evaluated the patient. - Based on these reviews, she has multinodular goiter with radiologically suspicious thyroid  nodules not completely worked up for malignancy. She will need repeat studies to help decide management. She is approached for repeat thyroid  function test including TgAb. She will also need repeat thyroid /neck ultrasound.  Depending on these findings, she will be approached either for repeat biopsy or expectant management/surgical intervention.  Patient has metabolic dysfunction indicated by severe dyslipidemia, hypertension, prediabetes, obesity, and she wishes to engage in lifestyle changes.  She will be approached for statin intervention on her next  - she acknowledges that there is a room for improvement in her food and drink choices. - Suggestion is made for her to avoid simple carbohydrates  from her diet including Cakes, Sweet Desserts, Ice  Cream, Soda (diet and regular), Sweet Tea, Candies, Chips, Cookies, Store Bought Juices, Alcohol , Artificial Sweeteners,  Coffee Creamer, and Sugar-Haley Products, Lemonade. This will help patient to have more stable blood glucose profile and potentially avoid unintended weight gain.  The following Lifestyle Medicine recommendations according to American College of Lifestyle Medicine  [facility]) were discussed and and offered to patient and she  agrees to start the journey:  A. Whole Foods, Plant-Based Nutrition comprising of fruits and vegetables, plant-based proteins, whole-grain carbohydrates was discussed in detail with the patient.   A list for source of those nutrients were also provided to the patient.  Patient will use only water or unsweetened tea for hydration. B.  The need to stay away from risky substances including alcohol, smoking; obtaining 7 to 9 hours of restorative sleep, at least 150 minutes of moderate intensity exercise weekly, the importance of healthy social connections,  and stress management techniques were discussed. C.  A full color page of  Calorie density of various food groups per pound showing examples of each food groups was provided to the patient.  - she is advised to maintain close follow up with Early, Sara E, NP for primary care needs.   -Thank you for involving me in the care of this pleasant patient.  Time spent with the patient: 46  minutes spent in  counseling her about multinodular goiter, prediabetes, hyperlipidemia and the rest in obtaining information about her symptoms, reviewing her previous labs/studies (including abstractions from other facilities),  evaluations, and treatments,  and developing a plan to confirm diagnosis and long term treatment based on the latest standards of care/guidelines; and documenting her care.  [name] participated in the discussions, expressed understanding, and voiced agreement with the above plans.  All questions were  answered to her satisfaction. she is encouraged to contact clinic should she have any questions or concerns prior to her return visit.  Follow up plan: Return in about 3 weeks (around 06/13/2024) for Thyroid  / Neck Ultrasound, Fasting Labs  in AM B4 8.   Ranny Earl, MD [facility] [address] [place], [place] [address] Phone: [phone]  Fax: [phone]     05/23/2024, 4:00 PM  This note was partially dictated with voice recognition software. Similar sounding words can be transcribed inadequately or may not  be corrected upon review.     [1]  Allergies Allergen Reactions   Betadine [Povidone Iodine] Itching   Codeine    Other     Band aids-itchy, red rash   "

## 2024-05-23 NOTE — Patient Instructions (Signed)

## 2024-06-01 ENCOUNTER — Ambulatory Visit (HOSPITAL_COMMUNITY): Admission: RE | Admit: 2024-06-01 | Discharge: 2024-06-01 | Attending: "Endocrinology | Admitting: "Endocrinology

## 2024-06-16 ENCOUNTER — Ambulatory Visit: Admitting: "Endocrinology

## 2024-06-19 ENCOUNTER — Other Ambulatory Visit

## 2024-06-19 ENCOUNTER — Ambulatory Visit: Admitting: "Endocrinology

## 2024-06-22 ENCOUNTER — Ambulatory Visit: Admitting: "Endocrinology

## 2024-07-04 ENCOUNTER — Ambulatory Visit: Payer: Self-pay | Admitting: Nurse Practitioner

## 2025-01-23 ENCOUNTER — Ambulatory Visit: Payer: Self-pay

## 2025-04-24 ENCOUNTER — Ambulatory Visit (HOSPITAL_BASED_OUTPATIENT_CLINIC_OR_DEPARTMENT_OTHER): Payer: PPO | Admitting: Obstetrics & Gynecology
